# Patient Record
Sex: Female | Born: 1955 | Race: White | Hispanic: No | Marital: Married | Smoking: Former smoker
Health system: Southern US, Community
[De-identification: ages and names within clinical notes are randomized; demographics above are authoritative.]

## PROBLEM LIST (undated history)

## (undated) DIAGNOSIS — I639 Cerebral infarction, unspecified: Secondary | ICD-10-CM

## (undated) DIAGNOSIS — I10 Essential (primary) hypertension: Secondary | ICD-10-CM

## (undated) DIAGNOSIS — N189 Chronic kidney disease, unspecified: Secondary | ICD-10-CM

## (undated) DIAGNOSIS — H269 Unspecified cataract: Secondary | ICD-10-CM

## (undated) DIAGNOSIS — E119 Type 2 diabetes mellitus without complications: Secondary | ICD-10-CM

## (undated) DIAGNOSIS — E785 Hyperlipidemia, unspecified: Secondary | ICD-10-CM

## (undated) HISTORY — PX: COLONOSCOPY: SHX174

## (undated) HISTORY — DX: Cerebral infarction, unspecified: I63.9

## (undated) HISTORY — PX: BACK SURGERY: SHX140

## (undated) HISTORY — DX: Unspecified cataract: H26.9

## (undated) HISTORY — DX: Type 2 diabetes mellitus without complications: E11.9

## (undated) HISTORY — DX: Hyperlipidemia, unspecified: E78.5

## (undated) HISTORY — PX: ABDOMINAL HYSTERECTOMY: SHX81

## (undated) HISTORY — DX: Chronic kidney disease, unspecified: N18.9

---

## 1998-06-06 ENCOUNTER — Ambulatory Visit (HOSPITAL_COMMUNITY): Admission: RE | Admit: 1998-06-06 | Discharge: 1998-06-06 | Payer: Self-pay | Admitting: Family Medicine

## 1999-11-07 ENCOUNTER — Other Ambulatory Visit: Admission: RE | Admit: 1999-11-07 | Discharge: 1999-11-07 | Payer: Self-pay | Admitting: Family Medicine

## 2001-02-12 ENCOUNTER — Encounter: Payer: Self-pay | Admitting: Family Medicine

## 2001-02-12 ENCOUNTER — Encounter: Admission: RE | Admit: 2001-02-12 | Discharge: 2001-02-12 | Payer: Self-pay | Admitting: Family Medicine

## 2001-05-07 ENCOUNTER — Encounter: Payer: Self-pay | Admitting: Family Medicine

## 2001-05-07 ENCOUNTER — Encounter: Admission: RE | Admit: 2001-05-07 | Discharge: 2001-05-07 | Payer: Self-pay | Admitting: Family Medicine

## 2002-12-20 ENCOUNTER — Encounter: Payer: Self-pay | Admitting: Family Medicine

## 2002-12-20 ENCOUNTER — Encounter: Admission: RE | Admit: 2002-12-20 | Discharge: 2002-12-20 | Payer: Self-pay | Admitting: Family Medicine

## 2002-12-22 ENCOUNTER — Encounter: Admission: RE | Admit: 2002-12-22 | Discharge: 2002-12-22 | Payer: Self-pay | Admitting: Family Medicine

## 2002-12-22 ENCOUNTER — Encounter: Payer: Self-pay | Admitting: Family Medicine

## 2003-06-11 ENCOUNTER — Emergency Department (HOSPITAL_COMMUNITY): Admission: EM | Admit: 2003-06-11 | Discharge: 2003-06-11 | Payer: Self-pay | Admitting: Emergency Medicine

## 2004-01-03 ENCOUNTER — Ambulatory Visit (HOSPITAL_COMMUNITY): Admission: RE | Admit: 2004-01-03 | Discharge: 2004-01-04 | Payer: Self-pay | Admitting: Neurosurgery

## 2004-08-21 ENCOUNTER — Encounter: Admission: RE | Admit: 2004-08-21 | Discharge: 2004-08-21 | Payer: Self-pay | Admitting: Family Medicine

## 2006-11-25 ENCOUNTER — Other Ambulatory Visit: Admission: RE | Admit: 2006-11-25 | Discharge: 2006-11-25 | Payer: Self-pay | Admitting: Family Medicine

## 2006-11-27 ENCOUNTER — Encounter: Admission: RE | Admit: 2006-11-27 | Discharge: 2006-11-27 | Payer: Self-pay | Admitting: Family Medicine

## 2006-12-24 ENCOUNTER — Encounter: Admission: RE | Admit: 2006-12-24 | Discharge: 2006-12-24 | Payer: Self-pay | Admitting: Family Medicine

## 2006-12-29 ENCOUNTER — Encounter: Admission: RE | Admit: 2006-12-29 | Discharge: 2006-12-29 | Payer: Self-pay | Admitting: Family Medicine

## 2007-03-09 ENCOUNTER — Inpatient Hospital Stay (HOSPITAL_COMMUNITY): Admission: RE | Admit: 2007-03-09 | Discharge: 2007-03-11 | Payer: Self-pay | Admitting: Obstetrics and Gynecology

## 2007-03-09 ENCOUNTER — Encounter (INDEPENDENT_AMBULATORY_CARE_PROVIDER_SITE_OTHER): Payer: Self-pay | Admitting: Obstetrics and Gynecology

## 2007-03-13 ENCOUNTER — Inpatient Hospital Stay (HOSPITAL_COMMUNITY): Admission: AD | Admit: 2007-03-13 | Discharge: 2007-03-13 | Payer: Self-pay | Admitting: Obstetrics and Gynecology

## 2007-06-29 ENCOUNTER — Encounter: Admission: RE | Admit: 2007-06-29 | Discharge: 2007-06-29 | Payer: Self-pay | Admitting: Family Medicine

## 2007-12-28 ENCOUNTER — Encounter: Admission: RE | Admit: 2007-12-28 | Discharge: 2007-12-28 | Payer: Self-pay | Admitting: Family Medicine

## 2009-03-08 ENCOUNTER — Encounter: Admission: RE | Admit: 2009-03-08 | Discharge: 2009-03-08 | Payer: Self-pay | Admitting: Family Medicine

## 2009-03-20 ENCOUNTER — Encounter: Admission: RE | Admit: 2009-03-20 | Discharge: 2009-03-20 | Payer: Self-pay | Admitting: Family Medicine

## 2010-03-21 ENCOUNTER — Encounter: Admission: RE | Admit: 2010-03-21 | Discharge: 2010-03-21 | Payer: Self-pay | Admitting: Family Medicine

## 2010-05-12 ENCOUNTER — Encounter: Payer: Self-pay | Admitting: Family Medicine

## 2010-09-03 NOTE — H&P (Signed)
NAMEARAIYA, Belinda Banks               ACCOUNT NO.:  [id number]   MEDICAL RECORD NO.:  [id number]          PATIENT TYPE:  AMB   LOCATION:  SDC                           [facility]  [facility]   PHYSICIAN:  Charles A. Delcambre, MDDATE OF BIRTH:  1955-07-19   DATE OF ADMISSION:  DATE OF DISCHARGE:                              HISTORY & PHYSICAL   This patient comes in today for further discussion with an ultrasound  showing an 8.4-cm ovarian cyst and sharp pains in the right lower  quadrant, heavy bleeding.  Hemoglobin has been in the 8 range, increased  to 9 range.  Fibroids were 3 to 4 cm and multiple.  She comes in for  discussion of ablation, laparoscopy versus TAH-BSO, and we have decided  to proceed on with definitive therapy, TAH-BSO.  She is a 55 year old  gravida 2, para 2.   PAST MEDICAL HISTORY:  Hypertension.   SURGICAL HISTORY:  Disk, anterior neck approach.   MEDICATIONS:  1. Toprol XL, dose not specified daily.  2. Diovan, dose not specified daily.  3. Iron and multivitamin daily.   PCP:  Dr. Manus Gunning at the [facility].   SOCIAL HISTORY:  Social alcohol use.  Quit smoking this year.  Sexually  active in a monogamous relationship with her partner monogamously.   FAMILY HISTORY:  Hypertension.  Breast cancer and on her mother's side  diabetes and osteoporosis, otherwise negative.   REVIEW OF SYSTEMS:  Anemia, otherwise negative full review.   PHYSICAL EXAM:  Blood pressure 140.90.  Heart rate 80.  Respirations 20.  Afebrile.  Weight 165.  Height 5 feet 8 inches.  HEENT:  Exam grossly within normal limits.  NECK:  Supple without thyromegaly or adenopathy.  LUNGS:  Clear bilaterally.  BREASTS:  Deferred.  Symmetrical.  ABDOMEN:  Soft, flat, and nontender.  No hepatosplenomegaly or other  masses noted.  PELVIC EXAM:  Normal external female genitalia.  BUS within normal  limits.  Vault without discharge or lesions.  Endometrial biopsy has  been done on 02/16/07.  Benign  proliferative endometrium and  degenerative secretory endometrium.  Stromal breakdown.  Bimanual  examination, uterus enlarged with leiomyomata.  Fullness in the right  adnexa.  Gently, knowing that the ovaries showed a cyst there, uterus 14-  week size.  EXTREMITIES:  No edema bilaterally.  Nontender.   ASSESSMENT:  1. Menorrhagia, 66.2.  2. Simple ovarian large but very large at 8.5 cm, 620.2.  3. Fibroid uterus, 218.9.  4. Irregular bleeding, 66.4.   PLAN:  Discussed as noted above and she decides on definitive treatment  with TAH-BSO.  She gives informed consent.  Accepts risks of infection,  bleeding, bowel and bladder damage, blood product risk including  hepatitis and HIV exposure, ureteral damage, incisional infection, deep  vein thrombosis.  All questions were answered.  She gives informed  consent and wishes to proceed.  Admit.  Take her blood pressure in the  morning.  Medications in the morning with a sip of water.  She will be  admitted on the 18th of November to undergo surgery.  SCDs will  be used  on to the OR.  Preoperative Cefotetan 2 g will be given IV.  Type and  cross will be done.  CMET will be drawn preoperatively and urine  pregnancy test date of admission.  All questions were answered and we  will proceed as outlined.      Charles A. Sydnee Cabal, MD  Electronically Signed     CAD/MEDQ  D:  03/01/2007  T:  03/02/2007  Job:  [id number]

## 2010-09-03 NOTE — Op Note (Signed)
Belinda Banks, Belinda Banks               ACCOUNT NO.:  [id number]   MEDICAL RECORD NO.:  [id number]          PATIENT TYPE:  INP   LOCATION:  9399                          [facility]ILITY[facility]  [facility]   PHYSICIAN:  Charles A. Delcambre, MDDATE OF BIRTH:  [birth year]   DATE OF PROCEDURE:  03/09/2007  DATE OF DISCHARGE:                               OPERATIVE REPORT   PREOPERATIVE DIAGNOSES:  1. Menorrhagia.  2. Uterine fibroids.  3. Irregular bleeding.  4. An 8 cm simple cyst left ovary.   POSTOPERATIVE DIAGNOSES:  1. Menorrhagia.  2. Uterine fibroids.  3. Irregular bleeding.  4. Left ovary with a paratubal cyst approximately 3 cm, together with      the ovary would be about 6 cm.  The ovary otherwise appeared      normal.   PROCEDURE:  Transabdominal hysterectomy, bilateral salpingo-  oophorectomy.   SURGEON:  Charles A. Sydnee Cabal, MD.   ASSISTANT:  Gerald Leitz, MD.   COMPLICATIONS:  None.   BLOOD LOSS:  500 mL.   ANESTHESIA:  General by the endotracheal route.   OPERATIVE FINDINGS:  Uterus with several 3-4 cm fibroids and otherwise  peritubal cyst as noted above.  Normal-appearing ovaries otherwise.  Uterus, tubes and ovaries to pathology.  Sponge, needle and instrument  counts were correct x2.   DESCRIPTION OF PROCEDURE:  The patient was taken to the operating room,  placed in the supine position.  A sterile prep and drape was undertaken  after anesthesia was adequate.  Pfannenstiel incision was made with  knife, carried down to fascia.  Fascia was incised with knife and Mayo  scissors.  Rectus sheath was released superiorly and inferiorly sharply  with Metzenbaum scissors and some electrocautery without damage to the  surrounding structures.  Peritoneum was entered with Metzenbaum scissors  without damage to the bowel.  Peritoneum was taken superiorly and  inferiorly.  Balfour retractor was placed.  Bladder blade was placed.  Bowel was packed out of the pelvis with four moistened laps.   The uterus  was grasped at the cornual regions with Kelly clamps, round ligaments  were opened bilaterally.  Lower uterine segment vesicouterine peritoneum  was taken with Metzenbaums to drop the bladder off the uterus.  We  transected the round ligaments and opened the broad ligament, then we  looked for the ureters.  Ureter was isolated on either side and the  infundibulopelvic pedicles were isolated away from the ureter.  They  were transected and free-tied and then transfixion stitched with 0  Vicryl.  Good  hemostasis resulted on either side.  Bladder was taken  down sharply with Metzenbaum scissors and with blunt dissection with  sponge forceps.  The uterine vessels were skeletonized and taken with  simple stitches bilaterally.  Hemostasis was good.  There was  encountered some backbleeding lower with each pedicle and for this  reason, blood loss was estimated to be 500 mL as we got into some venous  bleeding around the pedicle on the left.  This was crossclamped and  transfixion stitched with hemostasis resulting.  Remainder of the  cardinal  ligaments were taken down either side.  Pedicles were taken  down.  Remainder of the cardinal ligaments and finally the uterus was  amputated from the cervix, allowing better exposure.  The final clamps  cut the uterosacral ligaments in vaginal angle, cervix was amputated  from the vagina.  Richardson angle sutures were placed with 0 Vicryl and  these were tied to the uterosacral ligaments on either side.  Running  locking 0 Vicryl was used to close the cuff.  Hemostasis was adequate  after minor electrocautery.  On an the bladder peritoneal edge,  irrigation was carried out.  Hemostasis was again verified.  Subfascial  hemostasis was excellent after minor electrocautery.  The fascia was  then closed after all materials had been removed, Balfour retractor and  four laps.  Lap count was correct at that point.  The fascia was closed  with #1  Vicryl running nonlocking suture.  Hemostasis was achieved in  subcutaneous tissue with minor electrocautery, 5-0 Vicryl sutures were  used for subcutaneous closure.  Irrigation was carried out.  Hemostasis  was good.  Sterile skin clips were placed on the skin to close the  incision.  Sterile dressing was applied.  The patient tolerated the  procedure well and was taken to recovery with physician in attendance.      Charles A. Sydnee Cabal, MD  Electronically Signed     CAD/MEDQ  D:  03/09/2007  T:  03/09/2007  Job:  [id number]   cc:   Bryan Lemma. Manus Gunning, M.D.  Fax: [phone]

## 2010-09-06 NOTE — Discharge Summary (Signed)
Belinda Banks, Belinda Banks               ACCOUNT NO.:  [id number]   MEDICAL RECORD NO.:  [id number]          PATIENT TYPE:  INP   LOCATION:  9318                          [facility]  [facility]   PHYSICIAN:  Charles A. Delcambre, MDDATE OF BIRTH:  05-02-1955   DATE OF ADMISSION:  03/09/2007  DATE OF DISCHARGE:  03/11/2007                               DISCHARGE SUMMARY   DISCHARGE DIAGNOSES:  1. Cervical microglandular hyperplasia, benign.  2. Uterine leiomyomata.   PATHOLOGY:  Uterus, cervix and tubes and ovaries to pathology and also  note there were benign peritubal cysts noted.   DISPOSITION:  The patient is discharged home.   FOLLOW UP:  Follow to [facility] on March 13, 2007, two days later, for  discontinuation of staples.   DISCHARGE MEDICATIONS:  1. Percocet 5/325 one to two p.o. q.4 h. p.r.n. #40.  2. Motrin 800 mg one p.o. q.8 h. p.r.n., #30 x1.  3. KCl 20 mEq per day, #30 x1 and follow up with her PCP.   SPECIAL INSTRUCTIONS:  Potassium was checked prior to discharge and was  3.1.  It had stayed 3.1-3.2 throughout the hospitalization.  She was  given convalescent instructions and to notify if temperature greater  than 100 degrees, abdominal incisional, wound erythema or drainage or  pus or any significant vaginal bleeding.   ACTIVITY:  No driving x2 weeks.  No bath x2 weeks and up to that time a  shower will be okay.  No lifting greater than 30 pounds x4 weeks.   Cervix, uterus, tubes and ovaries to pathology.  Pathologist with  findings as noted above.   PERTINENT LABORATORY DATA:  Hematocrit 28.2, hemoglobin 9.8 on date of  discharge.  Potassium as noted above.  Pathology as noted above.   HOSPITAL COURSE:  The patient was admitted and underwent surgery as  noted above.  She had routine postoperative course without complication.  She had PCA released postop day #1 and voided without difficulty.  She  had some decreased urine output during the day and responded to pain  medications in a fluid bolus.  She continued  to do well and was given a soft diet on postop day #1.  KCl p.o. was 20  mEq per day.  Pain was controlled.  Diet was tolerated.  She was  ambulating, voiding without difficulty and was therefore discharged home  with followup as noted above.      Charles A. Sydnee Cabal, MD  Electronically Signed     CAD/MEDQ  D:  04/18/2007  T:  04/19/2007  Job:  [id number]

## 2010-09-06 NOTE — Op Note (Signed)
Belinda Banks, Belinda Banks                         ACCOUNT NO.:  [id number]   MEDICAL RECORD NO.:  [id number]                   PATIENT TYPE:  OIB   LOCATION:  3014                                 FACILITY:  [facility]   PHYSICIAN:  Cristi Loron, M.D.            DATE OF BIRTH:  1955-05-17   DATE OF PROCEDURE:  01/02/2004  DATE OF DISCHARGE:                                 OPERATIVE REPORT   BRIEF HISTORY:  The patient is a 55 year old white female who suffers from  neck and right arm pain.  She has failed medical management and was worked  up with a cervical MI which demonstrated a herniated disk and spondylosis at  C5-6 on the right. The patient's physical exam was consistent with a right  C6 radiculopathy.  I discussed the various treatment options with her  including surgery.  The patient was explained the risks, benefits, and  alternatives of surgery.  We decided to proceed with C5-6 anterior cervical  discectomy with fusion plating.   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   PROCEDURE:  C5-6 extensive anterior cervical discectomy/decompression,  interbody iliac crest, allograft arthrodesis; anterior cervical plating  (Codman's slim lock titanium plate and screws).   SURGEON:  Cristi Loron, M.D.   ASSISTANT:  None.   ANESTHESIA:  General endotracheal.   ESTIMATED BLOOD LOSS:  50 cc.   SPECIMENS:  None.   DRAINS:  None.   COMPLICATIONS:  None.   DESCRIPTION OF PROCEDURE:  The patient was brought to the operating room by  the anesthesia team.  General endotracheal anesthesia was induced.  The  patient remained in the supine position.  A roll was placed under her  shoulders which placed her neck in slight extension.  The anterior cervical  region was then prepared  with Betadine scrub and Betadine solution.  Sterile  drapes were applied.  I then injected the disk spaces on each side with  Marcaine with epinephrine solution.   I used a scalpel to make a transverse incision at the patient's left  anterior neck. I used the Metzenbaum scissors to divide the platysmal muscle  and then to dissect medially to the sternocleidomastoid muscle, jugular vein  and carotid artery.  I bluntly dissected down towards the anterior cervical  spine and carefully identified the esophagus and retracted it medially.  I  cleared the soft tissue from the anterior cervical spine using Kitner swabs.  I then inserted a spine needle into the upper intervertebral disk space and  obtained intraoperative radiographs to confirm our location.   We then used electrocautery to detach the medial border of the longus colli  muscle bilaterally at C5-6 intervertebral disk space.  We inserted a Caspar  self-retaining retractor exposure and then using a 15 blade scalpel to  incise  the C5-6 intervertebral disk space.   We then performed a partial diskectomy using pituitary forceps and a Karlen  curette.  We inserted distraction screws in the C5 and C6 distracted  interspace and then using high speed drill, decorticated the vertebral end  plates of Z6-1.  We drilled away the remainder of the C5-6 intervertebral  disk, to drill out some posterior spondylosis, and to thin out the posterior  longitudinal ligament.  We then incised the ligament with the retro knife,  removed it with the Kerrison punch undercutting the vertebral end plates and  decompressing of the thecal sac.  We performed at foraminotomy about the  bilateral C6 nerve root completing the decompression.  Of note, there was a  herniated disk at C5-6 on the right compressing the right C6 nerve root.  This completed the decompression.   We now turned attention to the arthrodesis.  We obtained iliac crest  tricortical allograft  bone graft and fashioned it to these approximate  dimensions 7 mm of height and 1 cm of depth.  We inserted the bone graft  into the distracted intervertebral disk space and then removed the  distraction screws.  There was a good snug fit of the bone graft.   We now turned out attention to the anterior spinal instrumentation.  We used  a high-speed drill to drill away some ventral spondylosis from the C5-6  intervertebral disk space.  There was quite a bit of spondylosis on the  right side.  We did this so that the plate would lay down flat.  We then  selected the appropriate length Codman SlimLock anterior cervical plate and  laid it along the anterior aspect of vertebral bodies at C5 and C6.  We  drilled 2 holes at C5, two at C6 and then secured the plate to the vertebral  bodies by placing two 12 mm self-tapping screws at C5, two at C6 and then  obtained an intraoperative radiograph which demonstrated good position of  the plate and screws in the bone graft and then we secured these screws to  the plate by locking each cap.   We then obtained stringent hemostasis using bipolar electrocautery.  We  copiously irrigated the wound with bacitracin solution and then removed the  Caspar self-retaining retractor.  We inspected the esophagus for any damage.  There was none apparent.  We then reapproximated with the patient's platysma  muscle with interrupted 3-0 Vicryl suture, subcutaneous tissue with #0  Vicryl suture and the skin with Steri-Strips and Benzoin.  The wound was  then coated with bacitracin ointment.  A sterile dressing was applied.  The  drapes were removed.  The patient was subsequently extubated by the  anesthesia team and transported to the postanesthesia care unit in stable  condition.  All sponge, instrument, and needle counts were correct in this  case.                                               Cristi Loron, M.D.   JDJ/MEDQ  D:  01/03/2004  T:  01/03/2004   Job:  [id number]

## 2011-01-28 LAB — COMPREHENSIVE METABOLIC PANEL
ALT: 39 — ABNORMAL HIGH
Albumin: 2.6 — ABNORMAL LOW
Albumin: 4
Alkaline Phosphatase: 33 — ABNORMAL LOW
BUN: 12
CO2: 33 — ABNORMAL HIGH
Chloride: 99
Creatinine, Ser: 0.59
Creatinine, Ser: 0.65
GFR calc Af Amer: >60
GFR calc Af Amer: >60
Glucose, Bld: 95
Potassium: 3.2 — ABNORMAL LOW
Total Protein: 4.6 — ABNORMAL LOW

## 2011-01-28 LAB — CBC
HCT: 28.2 — ABNORMAL LOW
HCT: 40.8
Hemoglobin: 9.8 — ABNORMAL LOW
Hemoglobin: 13.9
MCHC: 34.8
MCHC: 34
MCV: 86.8
MCV: 84.3
Platelets: 165
Platelets: 212
RBC: 3.25 — ABNORMAL LOW
RBC: 4.85
RDW: 30.2 — ABNORMAL HIGH
RDW: 31.5 — ABNORMAL HIGH
WBC: 7.6
WBC: 5.8

## 2011-01-28 LAB — ABO/RH: ABO/RH(D): A NEG

## 2011-01-28 LAB — PREGNANCY, URINE: Preg Test, Ur: NEGATIVE

## 2011-07-15 ENCOUNTER — Other Ambulatory Visit: Payer: Self-pay | Admitting: Family Medicine

## 2011-07-15 DIAGNOSIS — Z1231 Encounter for screening mammogram for malignant neoplasm of breast: Secondary | ICD-10-CM

## 2011-07-16 ENCOUNTER — Ambulatory Visit
Admission: RE | Admit: 2011-07-16 | Discharge: 2011-07-16 | Disposition: A | Discharge status: Home / Self Care | Payer: Managed Care, Other (non HMO) | Source: Ambulatory Visit | Attending: Family Medicine | Admitting: Family Medicine

## 2011-07-16 DIAGNOSIS — Z1231 Encounter for screening mammogram for malignant neoplasm of breast: Secondary | ICD-10-CM

## 2012-09-09 ENCOUNTER — Other Ambulatory Visit: Payer: Self-pay

## 2012-09-09 DIAGNOSIS — Z1231 Encounter for screening mammogram for malignant neoplasm of breast: Secondary | ICD-10-CM

## 2012-09-24 ENCOUNTER — Ambulatory Visit
Admission: RE | Admit: 2012-09-24 | Discharge: 2012-09-24 | Disposition: A | Discharge status: Home / Self Care | Payer: Managed Care, Other (non HMO) | Source: Ambulatory Visit

## 2012-09-24 DIAGNOSIS — Z1231 Encounter for screening mammogram for malignant neoplasm of breast: Secondary | ICD-10-CM

## 2019-03-05 ENCOUNTER — Inpatient Hospital Stay (HOSPITAL_COMMUNITY)
Admission: EM | Admit: 2019-03-05 | Discharge: 2019-03-09 | DRG: 064 | Disposition: A | Discharge status: Home / Self Care | Payer: Managed Care, Other (non HMO) | Attending: Neurology | Admitting: Neurology

## 2019-03-05 ENCOUNTER — Encounter (HOSPITAL_COMMUNITY): Payer: Self-pay

## 2019-03-05 ENCOUNTER — Other Ambulatory Visit: Payer: Self-pay

## 2019-03-05 ENCOUNTER — Emergency Department (HOSPITAL_COMMUNITY): Payer: Managed Care, Other (non HMO)

## 2019-03-05 DIAGNOSIS — R29704 NIHSS score 4: Secondary | ICD-10-CM | POA: Diagnosis present

## 2019-03-05 DIAGNOSIS — I161 Hypertensive emergency: Secondary | ICD-10-CM | POA: Diagnosis present

## 2019-03-05 DIAGNOSIS — I610 Nontraumatic intracerebral hemorrhage in hemisphere, subcortical: Secondary | ICD-10-CM

## 2019-03-05 DIAGNOSIS — R29810 Facial weakness: Secondary | ICD-10-CM | POA: Diagnosis not present

## 2019-03-05 DIAGNOSIS — R4781 Slurred speech: Secondary | ICD-10-CM | POA: Diagnosis present

## 2019-03-05 DIAGNOSIS — I639 Cerebral infarction, unspecified: Secondary | ICD-10-CM | POA: Diagnosis present

## 2019-03-05 DIAGNOSIS — E876 Hypokalemia: Secondary | ICD-10-CM | POA: Diagnosis present

## 2019-03-05 DIAGNOSIS — Z20828 Contact with and (suspected) exposure to other viral communicable diseases: Secondary | ICD-10-CM | POA: Diagnosis present

## 2019-03-05 DIAGNOSIS — Z87891 Personal history of nicotine dependence: Secondary | ICD-10-CM

## 2019-03-05 DIAGNOSIS — Z90710 Acquired absence of both cervix and uterus: Secondary | ICD-10-CM | POA: Diagnosis not present

## 2019-03-05 DIAGNOSIS — E785 Hyperlipidemia, unspecified: Secondary | ICD-10-CM | POA: Diagnosis present

## 2019-03-05 DIAGNOSIS — E1159 Type 2 diabetes mellitus with other circulatory complications: Secondary | ICD-10-CM | POA: Diagnosis not present

## 2019-03-05 DIAGNOSIS — Z7289 Other problems related to lifestyle: Secondary | ICD-10-CM | POA: Diagnosis not present

## 2019-03-05 DIAGNOSIS — I10 Essential (primary) hypertension: Secondary | ICD-10-CM | POA: Diagnosis present

## 2019-03-05 DIAGNOSIS — F1010 Alcohol abuse, uncomplicated: Secondary | ICD-10-CM | POA: Diagnosis present

## 2019-03-05 DIAGNOSIS — I619 Nontraumatic intracerebral hemorrhage, unspecified: Secondary | ICD-10-CM | POA: Diagnosis not present

## 2019-03-05 DIAGNOSIS — E119 Type 2 diabetes mellitus without complications: Secondary | ICD-10-CM

## 2019-03-05 DIAGNOSIS — E1165 Type 2 diabetes mellitus with hyperglycemia: Secondary | ICD-10-CM | POA: Diagnosis present

## 2019-03-05 DIAGNOSIS — I6389 Other cerebral infarction: Secondary | ICD-10-CM | POA: Diagnosis not present

## 2019-03-05 DIAGNOSIS — Z789 Other specified health status: Secondary | ICD-10-CM

## 2019-03-05 HISTORY — DX: Essential (primary) hypertension: I10

## 2019-03-05 LAB — CBC
Hemoglobin: 18.7 g/dL — ABNORMAL HIGH (ref 12.0–15.0)
Platelets: 176 10*3/uL (ref 150–400)
WBC: 11.5 10*3/uL — ABNORMAL HIGH (ref 4.0–10.5)

## 2019-03-05 LAB — COMPREHENSIVE METABOLIC PANEL
ALT: 100 U/L — ABNORMAL HIGH (ref 0–44)
AST: 86 U/L — ABNORMAL HIGH (ref 15–41)
Albumin: 4 g/dL (ref 3.5–5.0)
Alkaline Phosphatase: 107 U/L (ref 38–126)
Anion gap: 18 — ABNORMAL HIGH (ref 5–15)
BUN: 7 mg/dL — ABNORMAL LOW (ref 8–23)
CO2: 24 mmol/L (ref 22–32)
Calcium: 9.1 mg/dL (ref 8.9–10.3)
Chloride: 95 mmol/L — ABNORMAL LOW (ref 98–111)
Creatinine, Ser: 0.71 mg/dL (ref 0.44–1.00)
GFR calc Af Amer: >60 mL/min (ref >60)
GFR calc non Af Amer: >60 mL/min (ref >60)
Glucose, Bld: 224 mg/dL — ABNORMAL HIGH (ref 70–99)
Potassium: 2.4 mmol/L — CL (ref 3.5–5.1)
Sodium: 137 mmol/L (ref 135–145)
Total Bilirubin: 2.1 mg/dL — ABNORMAL HIGH (ref 0.3–1.2)
Total Protein: 8 g/dL (ref 6.5–8.1)

## 2019-03-05 LAB — I-STAT CHEM 8, ED
BUN: 7 mg/dL — ABNORMAL LOW (ref 8–23)
Calcium, Ion: 0.92 mmol/L — ABNORMAL LOW (ref 1.15–1.40)
Chloride: 96 mmol/L — ABNORMAL LOW (ref 98–111)
Creatinine, Ser: 0.6 mg/dL (ref 0.44–1.00)
Glucose, Bld: 226 mg/dL — ABNORMAL HIGH (ref 70–99)
HCT: 52 % — ABNORMAL HIGH (ref 36.0–46.0)
Hemoglobin: 17.7 g/dL — ABNORMAL HIGH (ref 12.0–15.0)
Potassium: 2.5 mmol/L — CL (ref 3.5–5.1)
Sodium: 137 mmol/L (ref 135–145)
TCO2: 25 mmol/L (ref 22–32)

## 2019-03-05 LAB — DIFFERENTIAL
Abs Immature Granulocytes: 0.04 10*3/uL (ref 0.00–0.07)
Basophils Absolute: 0.1 10*3/uL (ref 0.0–0.1)
Basophils Relative: 0 %
Eosinophils Absolute: 0.1 10*3/uL (ref 0.0–0.5)
Eosinophils Relative: 1 %
Immature Granulocytes: 0 %
Lymphocytes Relative: 16 %
Lymphs Abs: 1.8 10*3/uL (ref 0.7–4.0)
Monocytes Absolute: 1 10*3/uL (ref 0.1–1.0)
Monocytes Relative: 8 %
Neutro Abs: 8.6 10*3/uL — ABNORMAL HIGH (ref 1.7–7.7)
Neutrophils Relative %: 75 %

## 2019-03-05 LAB — PROTIME-INR
INR: 1.2 (ref 0.8–1.2)
Prothrombin Time: 15.1 seconds (ref 11.4–15.2)

## 2019-03-05 LAB — APTT: aPTT: 29 seconds (ref 24–36)

## 2019-03-05 LAB — MRSA PCR SCREENING: MRSA by PCR: NEGATIVE

## 2019-03-05 LAB — CBG MONITORING, ED: Glucose-Capillary: 229 mg/dL — ABNORMAL HIGH (ref 70–99)

## 2019-03-05 MED ORDER — STROKE: EARLY STAGES OF RECOVERY BOOK
Freq: Once | Status: AC
  Administered 2019-03-05: 21:00:00
  Filled 2019-03-05: qty 1

## 2019-03-05 MED ORDER — LABETALOL HCL 5 MG/ML IV SOLN
20.0000 mg | Freq: Once | INTRAVENOUS | Status: AC
  Administered 2019-03-05: 20 mg via INTRAVENOUS

## 2019-03-05 MED ORDER — ACETAMINOPHEN 650 MG RE SUPP: 650.0000 mg | RECTAL | Status: DC | PRN

## 2019-03-05 MED ORDER — LABETALOL HCL 5 MG/ML IV SOLN
20.0000 mg | Freq: Once | INTRAVENOUS | Status: AC
  Administered 2019-03-05: 19:00:00 20 mg via INTRAVENOUS

## 2019-03-05 MED ORDER — SENNOSIDES-DOCUSATE SODIUM 8.6-50 MG PO TABS
1.0000 | ORAL_TABLET | Freq: Two times a day (BID) | ORAL | Status: DC
  Administered 2019-03-06 – 2019-03-07 (×3): 1 via ORAL
  Filled 2019-03-05 (×6): qty 1

## 2019-03-05 MED ORDER — CLEVIDIPINE BUTYRATE 0.5 MG/ML IV EMUL
INTRAVENOUS | Status: AC
  Filled 2019-03-05: qty 50

## 2019-03-05 MED ORDER — SODIUM CHLORIDE 0.9% FLUSH: 3.0000 mL | Freq: Once | INTRAVENOUS | Status: DC

## 2019-03-05 MED ORDER — LABETALOL HCL 5 MG/ML IV SOLN
INTRAVENOUS | Status: AC
  Administered 2019-03-05: 19:00:00 20 mg via INTRAVENOUS
  Filled 2019-03-05: qty 4

## 2019-03-05 MED ORDER — VITAMIN B-1 100 MG PO TABS
100.0000 mg | ORAL_TABLET | Freq: Every day | ORAL | Status: DC
  Administered 2019-03-06 – 2019-03-09 (×4): 100 mg via ORAL
  Filled 2019-03-05 (×4): qty 1

## 2019-03-05 MED ORDER — CLEVIDIPINE BUTYRATE 0.5 MG/ML IV EMUL
0.0000 mg/h | INTRAVENOUS | Status: DC
  Administered 2019-03-05 (×2): 21 mg/h via INTRAVENOUS
  Administered 2019-03-05: 22:00:00 18 mg/h via INTRAVENOUS
  Administered 2019-03-05: 14 mg/h via INTRAVENOUS
  Administered 2019-03-05: 22:00:00 16 mg/h via INTRAVENOUS
  Administered 2019-03-05: 1 mg/h via INTRAVENOUS
  Administered 2019-03-06: 4 mg/h via INTRAVENOUS
  Administered 2019-03-06: 2 mg/h via INTRAVENOUS
  Administered 2019-03-07 (×2): 8 mg/h via INTRAVENOUS
  Administered 2019-03-07: 4 mg/h via INTRAVENOUS
  Administered 2019-03-07: 8 mg/h via INTRAVENOUS
  Administered 2019-03-07: 4 mg/h via INTRAVENOUS
  Administered 2019-03-07: 5 mg/h via INTRAVENOUS
  Filled 2019-03-05 (×11): qty 50
  Filled 2019-03-05: qty 100
  Filled 2019-03-05: qty 50

## 2019-03-05 MED ORDER — ACETAMINOPHEN 325 MG PO TABS
650.0000 mg | ORAL_TABLET | ORAL | Status: DC | PRN
  Filled 2019-03-05: qty 2

## 2019-03-05 MED ORDER — ACETAMINOPHEN 160 MG/5ML PO SOLN: 650.0000 mg | ORAL | Status: DC | PRN

## 2019-03-05 MED ORDER — POTASSIUM CHLORIDE 10 MEQ/100ML IV SOLN
10.0000 meq | INTRAVENOUS | Status: AC
  Administered 2019-03-05 (×3): 10 meq via INTRAVENOUS
  Filled 2019-03-05 (×2): qty 100

## 2019-03-05 MED ORDER — ORAL CARE MOUTH RINSE
15.0000 mL | Freq: Two times a day (BID) | OROMUCOSAL | Status: DC
  Administered 2019-03-05 – 2019-03-08 (×5): 15 mL via OROMUCOSAL

## 2019-03-05 MED ORDER — PANTOPRAZOLE SODIUM 40 MG IV SOLR
40.0000 mg | Freq: Every day | INTRAVENOUS | Status: DC
  Administered 2019-03-05: 22:00:00 40 mg via INTRAVENOUS
  Filled 2019-03-05: qty 40

## 2019-03-05 NOTE — H&P (Addendum)
NEURO HOSPITALIST  H&P   Chief Complaint: slurred speech/ right side weakness  History obtained from:  Patient     HPI:                                                                                                                                         Belinda Banks is an 63 y.o. female  With PMH HTN who presented to [facility] ED as a code stroke with c/o slurred speech and right side weakness.   per patient she had just returned home from playing with her grandchild at the park. She had a sudden onset of slurred speech. She then went to the bathroom and saw that her face was also drooping. EMS was called. Denies any CP, SOB, vision changes. She did report that her  Right arm was clumsy. Symptoms did get somewhat  Better before arriving to the hospital. Patient has not seen a doctor in about 7 years. She was previously diagnosed with HTN  Had lost weight and thus was removed from hypertensive medications. She has since regained weight.  ED course:  CTH: shows ICH BP: 230/158 BG: 238    Date last known well: 03/05/2019 Time last known well: 1730 tPA Given:no; ICH       Modified Rankin: Rankin Score=0 NIHSS: 4; slurred speech right arm and leg drift, sensation Intracerebral Hemorrhage (ICH) Score Total:  0    No family history on file.     Social History:  Denies ETOH, cigarettes, drug abuse  Allergies: No Known Allergies  Medications:                                                                                                                           Current Facility-Administered Medications  Medication Dose Route Frequency Provider Last Rate Last Dose  .  stroke: mapping our early stages of recovery book   Does not apply Once Vonzella Nipple, NP      . acetaminophen (TYLENOL) tablet 650 mg  650 mg Oral Q4H PRN Vonzella Nipple, NP  Or  . acetaminophen (TYLENOL) 160 MG/5ML solution 650 mg  650 mg Per Tube Q4H PRN  Vonzella Nipple, NP       Or  . acetaminophen (TYLENOL) suppository 650 mg  650 mg Rectal Q4H PRN Vonzella Nipple, NP      . clevidipine (CLEVIPREX) 0.5 MG/ML infusion           . clevidipine (CLEVIPREX) infusion 0.5 mg/mL  0-21 mg/hr Intravenous Continuous [facility], Karena Addison R, MD      . pantoprazole (PROTONIX) injection 40 mg  40 mg Intravenous QHS Vonzella Nipple, NP      . senna-docusate (Senokot-S) tablet 1 tablet  1 tablet Oral BID Vonzella Nipple, NP      . sodium chloride flush (NS) 0.9 % injection 3 mL  3 mL Intravenous Once Maudie Flakes, MD       No current outpatient medications on file.  '  ROS:                                                                                                                                       ROS was performed and is negative except as noted in HPI    General Examination:                                                                                                      Weight 73 kg.  Physical Exam  Constitutional: Appears well-developed and well-nourished.  Psych: Affect appropriate to situation Eyes: Normal external eye and conjunctiva. HENT: Normocephalic, no lesions, without obvious abnormality.   Musculoskeletal-no joint tenderness, deformity or swelling Cardiovascular: Normal rate and regular rhythm.  Respiratory: Effort normal, non-labored breathing saturations WNL GI: Soft.  No distension. There is no tenderness.  Skin: WDI  Neurological Examination Mental Status: Alert, oriented, thought content appropriate. Dysarthria, Speech fluent without evidence of aphasia.  Able to follow  commands without difficulty. Cranial Nerves: II:  Visual fields grossly normal,  III,IV, VI: ptosis not present, extra-ocular motions intact bilaterally, pupils equal, round, reactive to light and accommodation V,VII: smile asymmetric right facial droop, facial light touch sensation normal bilaterally VIII: hearing normal  bilaterally IX,X: uvula rises midline XI: bilateral shoulder shrug XII: midline tongue extension Motor: Right : Upper extremity   5/5  Left:     Upper extremity   5/5  Lower extremity   5/5   Lower extremity   5/5 Tone and bulk:normal tone throughout; no atrophy noted Sensory: reports  decreased sensation in  Right arm Plantars: Right: downgoing   Left: downgoing Cerebellar: No ataxia noted Gait: deferred Lab Results: Basic Metabolic Panel: Recent Labs  Lab 03/05/19 1827  NA 137  K 2.5*  CL 96*  GLUCOSE 226*  BUN 7*  CREATININE 0.60    CBC: Recent Labs  Lab 03/05/19 1827  HGB 17.7*  HCT 52.0*   CBG: Recent Labs  Lab 03/05/19 1820  GLUCAP 229*    Imaging: No results found.     Laurey Morale, MSN, NP-C [facility] [phone]  03/05/2019, 6:30 PM   Attending physician note to follow with Assessment and plan .   Assessment: Belinda Banks is an 63 y.o. female  With PMH HTN who presented to [facility] ED as a code stroke with c/o slurred speech and right side weakness. The [facility] showed hemorrhage so she was not a candidate for TPA. Will admit for stroke work up and management of blood pressure. Potassium is 2.5 Stroke Risk Factors - hypertension    Recommendations: -- BP goal : BP < 140/90  --Echocardiogram -- Prophylactic therapy-Antiplatelet med -- HgbA1c, fasting lipid panel -- PT consult, OT consult, Speech consult --Telemetry monitoring --Frequent neuro checks --Stroke swallow screen  --Correct potassium-   Assessment:   Plan: Subcortical ICH, nontraumatic  CNS Monitor closely  RESP monitor  CV Hypertensive Emergency -Aggressive BP control, goal SBP  <140 unless  -Cleviprex GTT initiated  GI/GU -Gentle hydration -monitor  HEME -Monitor -transfuse for hgb < 7  ENDO -goal HgbA1c < 7   ID -CXR -Monitor   Nutrition E66.9 Obesity  -diet consult  Prophylaxis DVT: SCDS GI: doc/senna  Code Status: Full Code      NEUROHOSPITALIST ADDENDUM Performed a face to face diagnostic evaluation at the time of code stroke  I have reviewed the contents of history and physical exam as documented by PA/ARNP/Resident and agree with above documentation.  I have discussed and formulated the above plan as documented. Edits to the note have been made as needed.   Patient presents with sudden onset facial droop, word finding difficulty and right-sided weakness. NIHSS 4, blood pressure greater than [id number] systolic.  Stat CT head showed a left capsular hemorrhage.  Blood pressure was controlled immediately with stat labetalol and Cleviprex.  This patient is neurologically critically ill due to [place].  He is at risk for significant risk of neurological worsening from cerebral edema,  death from brain herniation, heart failure, hemorrhagic conversion, infection, respiratory failure and seizure. This patient's care requires constant monitoring of vital signs, hemodynamics, respiratory and cardiac monitoring, review of multiple databases, neurological assessment, discussion with family, other specialists and medical decision making of high complexity.  Time spent was independent of time spent by PA.  I spent 45 minutes of neurocritical time in the care of this patient.     Karena Addison Amery Vandenbos MD [facility] [id number]   If 7pm to 7am, please call on call as listed on AMIO   --please page stroke NP  Or  PA  Or MD from 8am -4 pm  as this patient from this time will be  followed by the stroke.   You can look them up on [email or web address]  Password [email or web address]

## 2019-03-05 NOTE — ED Notes (Signed)
Unable to collect Covid test. Per Dr. Aviva Kluver hold off on swabbing due to pt SBP 190-200's, he would like for her SBP to come down some before swabbing.   Pt reports a hx of HTN but has been resolved with diet.

## 2019-03-05 NOTE — ED Triage Notes (Addendum)
Per [facility] pt from home, w.acute onset of garbled speech and Right side facial droop. Pt reports she was at the park with her grandson, arrived home approx 1730 when she noted her speech was off, she went to the bathroom and noticed a facial droop, she was unable to move her Right arm to try and google stroke symptoms. Pt LKW 1730, CBG 229, NIH 5

## 2019-03-05 NOTE — ED Provider Notes (Signed)
[facility] Provider Note   CSN: [id number] Arrival date & time: 03/05/19  1818  An emergency department physician performed an initial assessment on this suspected stroke patient at 1820.  History   Chief Complaint Chief Complaint  Patient presents with  . Code Stroke    HPI Belinda Banks is a 63 y.o. female.     HPI   Patient is a 63 year old female with a history of hypertension who presents to the emergency department today as a code stroke.  Last known well was at 5:30 PM.  Patient states she was sitting on the couch and she noticed that her voice sounded abnormal to her.  She went to the bathroom and noticed in the mirror that she was having facial droop.  She then tried to look at her phone to look up stroke symptoms and realize she was having difficulty moving her right arm.  She got up to walk and felt like her right leg was weak as well.  She denies any headaches.  No chest pain, shortness of breath, or any other symptoms.  Denies any recent fall/trauma.  Was previously treated for hypertension however is no longer being treated for this and has not seen a physician in 7 years.  Past Medical History:  Diagnosis Date  . Hypertension    controlled    Patient Active Problem List   Diagnosis Date Noted  . ICH (intracerebral hemorrhage) ([place]) 03/05/2019    Past Surgical History:  Procedure Laterality Date  . ABDOMINAL HYSTERECTOMY    . BACK SURGERY       OB History   No obstetric history on file.      Home Medications    Prior to Admission medications   Medication Sig Start Date End Date Taking? Authorizing Provider  Artificial Tear Ointment (DRY EYES OP) Place 1 drop into both eyes daily as needed (For dry eyes).   Yes [provider]    Family History History reviewed. No pertinent family history.  Social History Social History   Tobacco Use  . Smoking status: Former Research scientist (life sciences)  . Smokeless tobacco:  Never Used  Substance Use Topics  . Alcohol use: Yes    Alcohol/week: 6.0 standard drinks    Types: 3 Glasses of wine, 3 Standard drinks or equivalent per week    Comment: 5-6 drinks a day   . Drug use: Never     Allergies   Patient has no known allergies.   Review of Systems Review of Systems  Constitutional: Negative for fever.  HENT: Negative for ear pain and sore throat.   Eyes: Negative for visual disturbance.  Respiratory: Negative for cough and shortness of breath.   Cardiovascular: Negative for chest pain.  Gastrointestinal: Negative for abdominal pain, constipation, diarrhea, nausea and vomiting.  Genitourinary: Negative for dysuria and hematuria.  Musculoskeletal: Negative for back pain.  Skin: Negative for rash.  Neurological: Positive for weakness and numbness. Negative for headaches.       Facial droop, slurred speech  All other systems reviewed and are negative.    Physical Exam Updated Vital Signs BP (!) 156/84   Pulse 90   Resp 15   Ht 5\' 8"  (1.727 m)   Wt 73 kg   SpO2 (!) 89%   BMI 24.47 kg/m   Physical Exam Vitals signs and nursing note reviewed.  Constitutional:      General: She is not in acute distress.    Appearance: She is  well-developed.  HENT:     Head: Normocephalic and atraumatic.  Eyes:     Conjunctiva/sclera: Conjunctivae normal.  Neck:     Musculoskeletal: Neck supple.  Cardiovascular:     Rate and Rhythm: Normal rate and regular rhythm.     Pulses: Normal pulses.     Heart sounds: Normal heart sounds. No murmur.  Pulmonary:     Effort: Pulmonary effort is normal. No respiratory distress.     Breath sounds: Normal breath sounds. No wheezing, rhonchi or rales.  Abdominal:     General: Bowel sounds are normal.     Palpations: Abdomen is soft.     Tenderness: There is no abdominal tenderness.  Skin:    General: Skin is warm and dry.  Neurological:     Mental Status: She is alert.     Comments: Mental Status:  Alert,  thought content appropriate, able to give a coherent history. Speech fluent without evidence of aphasia. Able to follow 2 step commands without difficulty.  Cranial Nerves:  II:  pupils equal, round, reactive to light III,IV, VI: ptosis not present, extra-ocular motions intact bilaterally  V,VII: right facial droop, facial light touch sensation equal VIII: hearing grossly normal to voice  X: uvula elevates symmetrically  XI: bilateral shoulder shrug symmetric and strong XII: midline tongue extension without fassiculations Motor:  Normal tone. 4/5 strength to RUE/RLE, 5/5 strength to LUE/LLE Sensory: light touch normal in all extremities. Cerebellar: mild dysmetria with finger to nose of RUE Gait: not assessed +pronator drift of RUE/RLE    ED Treatments / Results  Labs (all labs ordered are listed, but only abnormal results are displayed) Labs Reviewed  COMPREHENSIVE METABOLIC PANEL - Abnormal; Notable for the following components:      Result Value   Potassium 2.4 (*)    Chloride 95 (*)    Glucose, Bld 224 (*)    BUN 7 (*)    AST 86 (*)    ALT 100 (*)    Total Bilirubin 2.1 (*)    Anion gap 18 (*)    All other components within normal limits  I-STAT CHEM 8, ED - Abnormal; Notable for the following components:   Potassium 2.5 (*)    Chloride 96 (*)    BUN 7 (*)    Glucose, Bld 226 (*)    Calcium, Ion 0.92 (*)    Hemoglobin 17.7 (*)    HCT 52.0 (*)    All other components within normal limits  CBG MONITORING, ED - Abnormal; Notable for the following components:   Glucose-Capillary 229 (*)    All other components within normal limits  SARS CORONAVIRUS 2 (TAT 6-24 HRS)  PROTIME-INR  APTT  CBC  DIFFERENTIAL  HIV ANTIBODY (ROUTINE TESTING W REFLEX)    EKG None  Radiology No results found.  Procedures Procedures (including critical care time) CRITICAL CARE Performed by: Rodney Booze   Total critical care time: 31 minutes  Critical care time was exclusive  of separately billable procedures and treating other patients.  Critical care was necessary to treat or prevent imminent or life-threatening deterioration.  Critical care was time spent personally by me on the following activities: development of treatment plan with patient and/or surrogate as well as nursing, discussions with consultants, evaluation of patient's response to treatment, examination of patient, obtaining history from patient or surrogate, ordering and performing treatments and interventions, ordering and review of laboratory studies, ordering and review of radiographic studies, pulse oximetry and re-evaluation of  patient's condition.   Medications Ordered in ED Medications  sodium chloride flush (NS) 0.9 % injection 3 mL (3 mLs Intravenous Not Given 03/05/19 1943)  clevidipine (CLEVIPREX) infusion 0.5 mg/mL (21 mg/hr Intravenous New Bag/Given 03/05/19 1942)  clevidipine (CLEVIPREX) 0.5 MG/ML infusion (has no administration in time range)   stroke: mapping our early stages of recovery book (has no administration in time range)  acetaminophen (TYLENOL) tablet 650 mg (has no administration in time range)    Or  acetaminophen (TYLENOL) 160 MG/5ML solution 650 mg (has no administration in time range)    Or  acetaminophen (TYLENOL) suppository 650 mg (has no administration in time range)  senna-docusate (Senokot-S) tablet 1 tablet (has no administration in time range)  pantoprazole (PROTONIX) injection 40 mg (has no administration in time range)  potassium chloride 10 mEq in 100 mL IVPB (has no administration in time range)  labetalol (NORMODYNE) injection 20 mg (20 mg Intravenous Given 03/05/19 1852)  labetalol (NORMODYNE) injection 20 mg (20 mg Intravenous Given by Other 03/05/19 1830)     Initial Impression / Assessment and Plan / ED Course  I have reviewed the triage vital signs and the nursing notes.  Pertinent labs & imaging results that were available during my care of  the patient were reviewed by me and considered in my medical decision making (see chart for details).     Final Clinical Impressions(s) / ED Diagnoses   Final diagnoses:  Nontraumatic intracerebral hemorrhage, unspecified cerebral location, unspecified laterality ([place])   62 y/o female presenting with stroke like sxs starting at 5:30pm today.   On arrival BP severely elevated, vitals otherwise wnl. On exam, right facial droop, rue/rle pronator drift and is slightly weaker than left. Normal sensation throughout.   Labs reviewed Cbc pending on admission CMP with hypokalemia, elevated LFTs, normal Cr.  INR wnl.  Dr Aroor with neurology at bedside to eval pt. Reviewed CT head which showed intraparenchymal hemorrhage. Labetolol given and cardene initiated. Pt to be admitted to neuro.    ED Discharge Orders    None       Bishop Dublin 03/05/19 2001    Maudie Flakes, MD 03/06/19 1459

## 2019-03-05 NOTE — Code Documentation (Signed)
Patient arrived via EMS at [facility] known well at 1730.  Stat labs and Head CT done.  NIHSS 5: right arm and leg drift, mild decreased sensory and dysarthria.  Dr Aroor at bedside to assess patient.  ICH. Labetalol given IV x2, Cleviprex gtt titrated to 32mg  per orders.  BP 215/110 to 156/84.   Husband at bedside Plan admit to 4N ICU

## 2019-03-06 ENCOUNTER — Inpatient Hospital Stay (HOSPITAL_COMMUNITY): Payer: Managed Care, Other (non HMO)

## 2019-03-06 DIAGNOSIS — F1010 Alcohol abuse, uncomplicated: Secondary | ICD-10-CM

## 2019-03-06 DIAGNOSIS — E876 Hypokalemia: Secondary | ICD-10-CM

## 2019-03-06 DIAGNOSIS — I161 Hypertensive emergency: Secondary | ICD-10-CM

## 2019-03-06 DIAGNOSIS — E1159 Type 2 diabetes mellitus with other circulatory complications: Secondary | ICD-10-CM

## 2019-03-06 DIAGNOSIS — I6389 Other cerebral infarction: Secondary | ICD-10-CM

## 2019-03-06 DIAGNOSIS — E785 Hyperlipidemia, unspecified: Secondary | ICD-10-CM

## 2019-03-06 LAB — GLUCOSE, CAPILLARY
Glucose-Capillary: 319 mg/dL — ABNORMAL HIGH (ref 70–99)
Glucose-Capillary: 274 mg/dL — ABNORMAL HIGH (ref 70–99)
Glucose-Capillary: 254 mg/dL — ABNORMAL HIGH (ref 70–99)

## 2019-03-06 LAB — ECHOCARDIOGRAM COMPLETE
Height: 68 in
Weight: 2574.97 oz

## 2019-03-06 LAB — CBC
HCT: 48.4 % — ABNORMAL HIGH (ref 36.0–46.0)
Hemoglobin: 17.6 g/dL — ABNORMAL HIGH (ref 12.0–15.0)
MCH: 37.9 pg — ABNORMAL HIGH (ref 26.0–34.0)
MCHC: 36.4 g/dL — ABNORMAL HIGH (ref 30.0–36.0)
MCV: 104.3 fL — ABNORMAL HIGH (ref 80.0–100.0)
Platelets: 206 10*3/uL (ref 150–400)
RBC: 4.64 MIL/uL (ref 3.87–5.11)
RDW: 12.9 % (ref 11.5–15.5)
WBC: 12.3 10*3/uL — ABNORMAL HIGH (ref 4.0–10.5)
nRBC: 0 % (ref 0.0–0.2)

## 2019-03-06 LAB — RAPID URINE DRUG SCREEN, HOSP PERFORMED
Amphetamines: NOT DETECTED
Barbiturates: NOT DETECTED
Benzodiazepines: NOT DETECTED
Cocaine: NOT DETECTED
Opiates: NOT DETECTED
Tetrahydrocannabinol: NOT DETECTED

## 2019-03-06 LAB — LIPID PANEL
Cholesterol: 191 mg/dL (ref 0–200)
HDL: 45 mg/dL (ref >40)
LDL Cholesterol: 106 mg/dL — ABNORMAL HIGH (ref 0–99)
Total CHOL/HDL Ratio: 4.2 RATIO
Triglycerides: 200 mg/dL — ABNORMAL HIGH (ref <150)
VLDL: 40 mg/dL (ref 0–40)

## 2019-03-06 LAB — POTASSIUM: Potassium: 3.6 mmol/L (ref 3.5–5.1)

## 2019-03-06 LAB — HEMOGLOBIN A1C
Hgb A1c MFr Bld: 7.6 % — ABNORMAL HIGH (ref 4.8–5.6)
Mean Plasma Glucose: 171.42 mg/dL

## 2019-03-06 LAB — MAGNESIUM: Magnesium: 1.6 mg/dL — ABNORMAL LOW (ref 1.7–2.4)

## 2019-03-06 LAB — HIV ANTIBODY (ROUTINE TESTING W REFLEX): HIV Screen 4th Generation wRfx: NONREACTIVE

## 2019-03-06 LAB — SARS CORONAVIRUS 2 (TAT 6-24 HRS): SARS Coronavirus 2: NEGATIVE

## 2019-03-06 MED ORDER — CHLORHEXIDINE GLUCONATE CLOTH 2 % EX PADS
6.0000 | MEDICATED_PAD | Freq: Every day | CUTANEOUS | Status: DC
  Administered 2019-03-06 – 2019-03-08 (×3): 6 via TOPICAL

## 2019-03-06 MED ORDER — POTASSIUM CHLORIDE 10 MEQ/100ML IV SOLN
10.0000 meq | INTRAVENOUS | Status: AC
  Administered 2019-03-06 (×4): 10 meq via INTRAVENOUS
  Filled 2019-03-06 (×4): qty 100

## 2019-03-06 MED ORDER — LORAZEPAM 2 MG/ML IJ SOLN: 1.0000 mg | INTRAMUSCULAR | Status: AC | PRN

## 2019-03-06 MED ORDER — LORAZEPAM 1 MG PO TABS
1.0000 mg | ORAL_TABLET | ORAL | Status: AC | PRN
  Filled 2019-03-06: qty 1

## 2019-03-06 MED ORDER — AMLODIPINE BESYLATE 10 MG PO TABS: 10.0000 mg | ORAL_TABLET | Freq: Every day | ORAL | Status: DC

## 2019-03-06 MED ORDER — AMLODIPINE BESYLATE 10 MG PO TABS
10.0000 mg | ORAL_TABLET | Freq: Every day | ORAL | Status: DC
  Administered 2019-03-06 – 2019-03-09 (×4): 10 mg via ORAL
  Filled 2019-03-06 (×4): qty 1

## 2019-03-06 MED ORDER — POTASSIUM CHLORIDE CRYS ER 20 MEQ PO TBCR
40.0000 meq | EXTENDED_RELEASE_TABLET | Freq: Once | ORAL | Status: AC
  Administered 2019-03-06: 40 meq via ORAL
  Filled 2019-03-06: qty 2

## 2019-03-06 MED ORDER — PROSIGHT PO TABS
1.0000 | ORAL_TABLET | Freq: Every day | ORAL | Status: DC
  Administered 2019-03-06 – 2019-03-09 (×4): 1 via ORAL
  Filled 2019-03-06 (×4): qty 1

## 2019-03-06 MED ORDER — FOLIC ACID 1 MG PO TABS
1.0000 mg | ORAL_TABLET | Freq: Every day | ORAL | Status: DC
  Administered 2019-03-06 – 2019-03-09 (×4): 1 mg via ORAL
  Filled 2019-03-06 (×4): qty 1

## 2019-03-06 MED ORDER — PANTOPRAZOLE SODIUM 40 MG PO TBEC
40.0000 mg | DELAYED_RELEASE_TABLET | Freq: Every day | ORAL | Status: DC
  Administered 2019-03-06 – 2019-03-09 (×4): 40 mg via ORAL
  Filled 2019-03-06 (×4): qty 1

## 2019-03-06 MED ORDER — LOSARTAN POTASSIUM 50 MG PO TABS
50.0000 mg | ORAL_TABLET | Freq: Every day | ORAL | Status: DC
  Administered 2019-03-06 – 2019-03-07 (×2): 50 mg via ORAL
  Filled 2019-03-06 (×2): qty 1

## 2019-03-06 MED ORDER — LABETALOL HCL 5 MG/ML IV SOLN
5.0000 mg | INTRAVENOUS | Status: DC | PRN
  Filled 2019-03-06: qty 4

## 2019-03-06 MED ORDER — INSULIN ASPART 100 UNIT/ML ~~LOC~~ SOLN
0.0000 [IU] | Freq: Three times a day (TID) | SUBCUTANEOUS | Status: DC
  Administered 2019-03-06: 7 [IU] via SUBCUTANEOUS
  Administered 2019-03-06: 5 [IU] via SUBCUTANEOUS
  Administered 2019-03-07: 2 [IU] via SUBCUTANEOUS
  Administered 2019-03-07: 9 [IU] via SUBCUTANEOUS

## 2019-03-06 MED ORDER — IOHEXOL 350 MG/ML SOLN
100.0000 mL | Freq: Once | INTRAVENOUS | Status: AC | PRN
  Administered 2019-03-06: 100 mL via INTRAVENOUS

## 2019-03-06 NOTE — Plan of Care (Signed)
Pt ambulates to the bathroom with minimal assistance

## 2019-03-06 NOTE — Evaluation (Signed)
Physical Therapy Evaluation Patient Details Name: Belinda Banks MRN: [id number] DOB: 1955/10/20 Today's Date: 03/06/2019   History of Present Illness  63 y.o. female  With PMH HTN who [facility] as a code stroke with c/o slurred speech and right side weakness. NIHSS 4; SBP >240; CTH showed left basal ganglia hemorrhage so she was not a candidate for TPA. CT angio head no aneurysm, dissection or significant stenosis; PMH HTN   Clinical Impression   Pt admitted with above diagnosis. Limited evaluation due to elevated BP at EOB. Patient does have RLE weakness and anticipate will have gait/imbalance deficits. Will further assess as BP allows. Pt currently with functional limitations due to the deficits listed below (see PT Problem List). Pt will benefit from skilled PT to increase their independence and safety with mobility to allow discharge to the venue listed below.       Follow Up Recommendations Outpatient PT;Supervision for mobility/OOB(TBD as further activity/balance can be assessed)    Equipment Recommendations  Other (comment)(TBD as further activity/balance can be assessed)    Recommendations for Other Services       Precautions / Restrictions Precautions Precautions: Fall      Mobility  Bed Mobility Overal bed mobility: Needs Assistance Bed Mobility: Supine to Sit;Sit to Supine     Supine to sit: Supervision Sit to supine: Supervision   General bed mobility comments: reliant on use of bedrails; HOB 0  Transfers                 General transfer comment: unable to attempt due to elevated BP   Ambulation/Gait             General Gait Details: unable to attempt due to elevated BP   Stairs            Wheelchair Mobility    Modified Rankin (Stroke Patients Only) Modified Rankin (Stroke Patients Only) Pre-Morbid Rankin Score: No symptoms Modified Rankin: Moderately severe disability     Balance Overall balance assessment: Needs  assistance Sitting-balance support: Feet supported Sitting balance-Leahy Scale: Good Sitting balance - Comments: static sitting WFL; able to shift weight anteriorly and left to right to adjust her gown and reach feet to floor with no imbalance                                     Pertinent Vitals/Pain Pain Assessment: No/denies pain    Home Living Family/patient expects to be discharged to:: Private residence Living Arrangements: Spouse/significant other Available Help at Discharge: Family;Available 24 hours/day(spouse retired) Type of Home: House Home Access: Stairs to enter Entrance Stairs-Rails: Right Technical brewer of Steps: 6 Home Layout: One level Home Equipment: None      Prior Function Level of Independence: Independent         Comments: currently working from home; Audiological scientist [employer]   Dominant Hand: Right    Extremity/Trunk Assessment   Upper Extremity Assessment Upper Extremity Assessment: Defer to OT evaluation RUE Deficits / Details: grossly 4/5  incoordination noted Rt hand.  Pt demonstrates difficulty manipulating utensils with Rt UE and noted to drop items.  She denies sensory changes  RUE Coordination: decreased fine motor;decreased gross motor    Lower Extremity Assessment Lower Extremity Assessment: RLE deficits/detail RLE Deficits / Details: ankle DF 4/5    Cervical / Trunk Assessment Cervical / Trunk Assessment: Normal  Communication  Communication: Expressive difficulties(dysarthria noted )  Cognition Arousal/Alertness: Awake/alert Behavior During Therapy: WFL for tasks assessed/performed Overall Cognitive Status: Within Functional Limits for tasks assessed                                 General Comments: grossly [facility]       General Comments General comments (skin integrity, edema, etc.): BP sitting EOB 175/101.  Alerted both MD and RN.  MD requests pt return to supine.  Eval terminated at  that time.     Exercises     Assessment/Plan    PT Assessment Patient needs continued PT services  PT Problem List Decreased strength;Decreased activity tolerance;Decreased balance;Decreased mobility;Decreased knowledge of use of DME;Cardiopulmonary status limiting activity       PT Treatment Interventions DME instruction;Gait training;Stair training;Functional mobility training;Therapeutic activities;Therapeutic exercise;Balance training;Neuromuscular re-education;Patient/family education    PT Goals (Current goals can be found in the Care Plan section)  Acute Rehab PT Goals Patient Stated Goal: to get back to normal and to get back to work  PT Goal Formulation: With patient Time For Goal Achievement: 03/20/19 Potential to Achieve Goals: Good    Frequency Min 4X/week   Barriers to discharge        Co-evaluation PT/OT/SLP Co-Evaluation/Treatment: Yes Reason for Co-Treatment: For patient/therapist safety;To address functional/ADL transfers PT goals addressed during session: Mobility/safety with mobility;Balance OT goals addressed during session: ADL's and self-care       AM-PAC PT "6 Clicks" Mobility  Outcome Measure Help needed turning from your back to your side while in a flat bed without using bedrails?: None Help needed moving from lying on your back to sitting on the side of a flat bed without using bedrails?: A Little Help needed moving to and from a bed to a chair (including a wheelchair)?: A Little Help needed standing up from a chair using your arms (e.g., wheelchair or bedside chair)?: A Little Help needed to walk in hospital room?: A Little Help needed climbing 3-5 steps with a railing? : A Lot 6 Click Score: 18    End of Session   Activity Tolerance: Treatment limited secondary to medical complications (Comment) Patient left: in bed;with call bell/phone within reach;with bed alarm set Nurse Communication: Other (comment)(elevated BP with sit EOB and  returned to supine) PT Visit Diagnosis: Hemiplegia and hemiparesis Hemiplegia - Right/Left: Right Hemiplegia - dominant/non-dominant: Dominant Hemiplegia - caused by: Other Nontraumatic intracranial hemorrhage    Time: [id number] PT Time Calculation (min) (ACUTE ONLY): 19 min   Charges:   PT Evaluation $PT Eval Moderate Complexity: 1 Mod           Barry Brunner, PT Pager [phone]   Rexanne Mano 03/06/2019, 1:35 PM

## 2019-03-06 NOTE — Progress Notes (Signed)
  Echocardiogram 2D Echocardiogram has been performed.  Jonnathan Birman A Lynisha Osuch 03/06/2019, 3:30 PM

## 2019-03-06 NOTE — Plan of Care (Signed)
Pt ambulates to the bathroom with assistance. 

## 2019-03-06 NOTE — Progress Notes (Signed)
CRITICAL VALUE ALERT  Critical Value:  2.5K+  Date & Time Notied:  11/15 0611  Provider Notified:Dr Leonel Ramsay  Orders Received/Actions taken: Awaiting MD orders

## 2019-03-06 NOTE — Evaluation (Signed)
Speech Language Pathology Evaluation Patient Details Name: Belinda Banks MRN: [id number] DOB: 1956-03-04 Today's Date: 03/06/2019 Time: 1001-1020 SLP Time Calculation (min) (ACUTE ONLY): 19 min  Problem List:  Patient Active Problem List   Diagnosis Date Noted  . ICH (intracerebral hemorrhage) ([place]) 03/05/2019   Past Medical History:  Past Medical History:  Diagnosis Date  . Hypertension    controlled   Past Surgical History:  Past Surgical History:  Procedure Laterality Date  . ABDOMINAL HYSTERECTOMY    . BACK SURGERY     HPI:  63 year old female admitted with right sided weakness. CT showed 2.1 x 1.3 x 2.8 cm acute intraparenchymal hemorrhage centered at the left lentiform nucleus, estimated volume 3.8 cc. No significant regional mass effect.   Assessment / Plan / Recommendation Clinical Impression  Patient presents with only mild dysarthria due to right sided facial weakness and ROM s/p acute CVA. All other areas of cognitive-linguistic function WFL. Patient 75-100% intelligible at the conversation level. SLP provided verbal instruction for use of basic OME and compensatory strategies to improve intelligibility. Patient verbalized understanding but would benefit from brief f/u for reinforcement as well as possible OP SLP f/u should deficits not resolve. `    SLP Assessment  SLP Recommendation/Assessment: Patient needs continued Speech Lanaguage Pathology Services SLP Visit Diagnosis: Dysarthria and anarthria (R47.1)    Follow Up Recommendations  Outpatient SLP    Frequency and Duration min 1 x/week  1 week      SLP Evaluation Cognition  Overall Cognitive Status: Within Functional Limits for tasks assessed Orientation Level: Oriented X4       Comprehension  Auditory Comprehension Overall Auditory Comprehension: Appears within functional limits for tasks assessed Visual Recognition/Discrimination Discrimination: Within Function Limits Reading  Comprehension Reading Status: Within funtional limits    Expression Expression Primary Mode of Expression: Verbal Verbal Expression Overall Verbal Expression: Appears within functional limits for tasks assessed Written Expression Dominant Hand: Right   Oral / Motor  Oral Motor/Sensory Function Overall Oral Motor/Sensory Function: Mild impairment Facial ROM: Reduced right;Suspected CN VII (facial) dysfunction Facial Symmetry: Abnormal symmetry right;Suspected CN VII (facial) dysfunction Facial Strength: Reduced right;Suspected CN VII (facial) dysfunction Facial Sensation: Within Functional Limits Lingual ROM: Within Functional Limits Lingual Symmetry: Within Functional Limits Lingual Strength: Within Functional Limits Lingual Sensation: Within Functional Limits Velum: Within Functional Limits Mandible: Within Functional Limits Motor Speech Overall Motor Speech: Impaired Respiration: Within functional limits Phonation: Normal Resonance: Within functional limits Articulation: Impaired Level of Impairment: Word Intelligibility: Intelligibility reduced Word: 75-100% accurate Phrase: 75-100% accurate Sentence: 75-100% accurate Conversation: 75-100% accurate Motor Planning: Witnin functional limits   GO                   Carlisle Enke MA, CCC-SLP   Jerrico Covello Meryl 03/06/2019, 10:24 AM

## 2019-03-06 NOTE — Plan of Care (Signed)
  Problem: Education: Goal: Knowledge of disease or condition will improve Outcome: Progressing   

## 2019-03-06 NOTE — Progress Notes (Signed)
STROKE TEAM PROGRESS NOTE   INTERVAL HISTORY No family is at the bedside.  Pt AAO x 3, denies HA. Still has mild right facial droop and right hand clumsy. BP under control with IV cleviprex. Put on po BP meds. CT repeat this am showed stable hematoma. Pt admitted that she drinks 4-6 drinks per day. Will put on CIWA protocol.    OBJECTIVE Vitals:   03/06/19 0500 03/06/19 0515 03/06/19 0530 03/06/19 0545  BP: 137/83 129/80 122/81 112/81  Pulse: 88 79 78 77  Resp: 15 16 14 15   Temp:      TempSrc:      SpO2: 95% 94% 93% 94%  Weight:      Height:        CBC:  Recent Labs  Lab 03/05/19 1826 03/05/19 1827  WBC 11.5*  --   NEUTROABS 8.6*  --   HGB 18.7* 17.7*  HCT RESULTS UNAVAILABLE DUE TO INTERFERING SUBSTANCE 52.0*  MCV RESULTS UNAVAILABLE DUE TO INTERFERING SUBSTANCE  --   PLT 176  --     Basic Metabolic Panel:  Recent Labs  Lab 03/05/19 1826 03/05/19 1827 03/06/19 0445  NA 137 137 136  K 2.4* 2.5* 2.5*  CL 95* 96* 94*  CO2 24  --  26  GLUCOSE 224* 226* 279*  BUN 7* 7* 5*  CREATININE 0.71 0.60 0.64  CALCIUM 9.1  --  8.4*    Lipid Panel:     Component Value Date/Time   CHOL 191 03/06/2019 0445   TRIG 200 (H) 03/06/2019 0445   HDL 45 03/06/2019 0445   CHOLHDL 4.2 03/06/2019 0445   VLDL 40 03/06/2019 0445   LDLCALC 106 (H) 03/06/2019 0445   HgbA1c: No results found for: HGBA1C Urine Drug Screen: No results found for: LABOPIA, COCAINSCRNUR, LABBENZ, AMPHETMU, THCU, LABBARB  Alcohol Level No results found for: ETH  IMAGING  Ct Angio Head W Or Wo Contrast Ct Angio Neck W Or Wo Contrast 03/06/2019 IMPRESSION:  1. No aneurysm, dissection or hemodynamically significant stenosis of the major cervical or intracranial arteries.  2. Unchanged size of hypertensive hemorrhage in left basal ganglia.   Ct Head Code Stroke Wo Contrast 03/05/2019 IMPRESSION:  1. 2.1 x 1.3 x 2.8 cm acute intraparenchymal hemorrhage centered at the left lentiform nucleus, estimated  volume 3.8 cc. No significant regional mass effect.  2. Underlying mild age-related cerebral atrophy with chronic small vessel ischemic disease.    Transthoracic Echocardiogram  Pending   ECG - ST rate 101 BPM. (See cardiology reading for complete details)   PHYSICAL EXAM  Temp:  [98.4 F (36.9 C)-99.1 F (37.3 C)] 98.4 F (36.9 C) (11/15 0800) Pulse Rate:  [71-107] 88 (11/15 0700) Resp:  [13-26] 19 (11/15 0700) BP: (112-206)/(66-140) 112/81 (11/15 0545) SpO2:  [89 %-100 %] 94 % (11/15 0545) Weight:  [73 kg] 73 kg (11/14 1838)  General - Well nourished, well developed, in no apparent distress.  Ophthalmologic - fundi not visualized due to noncooperation.  Cardiovascular - Regular rhythm and rate.  Mental Status -  Level of arousal and orientation to time, place, and person were intact. Language including expression, naming, repetition, comprehension was assessed and found intact. Fund of Knowledge was assessed and was intact.  Cranial Nerves II - XII - II - Visual field intact OU. III, IV, VI - Extraocular movements intact. V - Facial sensation intact bilaterally. VII - mild left facial droop. VIII - Hearing & vestibular intact bilaterally. X - Palate elevates symmetrically.  XI - Chin turning & shoulder shrug intact bilaterally. XII - Tongue protrusion intact.  Motor Strength - The patient's strength was normal in all extremities except left pronator drift was absent and left hand dexterity difficulty.  Bulk was normal and fasciculations were absent.   Motor Tone - Muscle tone was assessed at the neck and appendages and was normal.  Reflexes - The patient's reflexes were symmetrical in all extremities and she had no pathological reflexes.  Sensory - Light touch, temperature/pinprick were assessed and were symmetrical.    Coordination - The patient had normal movements in the hands and feet with no ataxia or dysmetria.  Tremor was absent.  Gait and Station -  deferred.   ASSESSMENT/PLAN Belinda Banks is a 63 y.o. female with history of HTN (no recent medical f/u) who presented to [facility] ED as a code stroke with c/o slurred speech, right sided weakness, facial droop, BP 230/158 and glucose 238. She did not receive IV t-PA due to [place].  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  Code Stroke CT Head -  Small ICH centered at the left lentiform nucleus, estimated volume 3.8 cc.   CTA H&N -  Unchanged size of hypertensive hemorrhage in left basal ganglia. No aneurysm or AVM    2D Echo - pending  Sars Corona Virus 2 - negative  LDL - 106  HgbA1c - 7.6  UDS - pending  VTE prophylaxis -  SCDs  No antithrombotic prior to admission, now on No antithrombotic  Ongoing aggressive stroke risk factor management  Therapy recommendations:  pending  Disposition:  Pending  Hypertensive emergency  Home BP meds: none   Current BP meds: Norvasc ; Cozaar ; Cleviprex ; Labetalol prn  Stable . SBP goal - 120 - 160 now given stable hematoma . Long-term BP goal normotensive  Alcohol abuse  Pt admitted 4-6 drinks per day  Now on B1/FA/MVI  Put on CIWA protocol   advised to drink no more than 1 alcoholic beverage per day  Hyperlipidemia  Home Lipid lowering medication: none   LDL 106, goal < 70  Current lipid lowering medication: none.  Will consider statin at time of discharge  Diabetes, new diagnosis  Home diabetic meds: none  Current diabetic meds: none  HgbA1c 7.6, goal < 7.0  hyperglycemia  SSI  CBG nonitoring  DM educator consult placed  Other Stroke Risk Factors  Former cigarette smoker - quit  Other Active Problems  Hypokalemia - 2.5 -> supplemented  WBC - 11.5->12.3 (afebrile) (daily labs)  Hospital day # 1  This patient is critically ill due to [place], hypertensive emergency, DM, alcohol abuse and at significant risk of neurological worsening, death form hematoma expansion, DKA, DT,  alcohol withdraw seizure, encephalopathy. This patient's care requires constant monitoring of vital signs, hemodynamics, respiratory and cardiac monitoring, review of multiple databases, neurological assessment, discussion with family, other specialists and medical decision making of high complexity. I spent 35 minutes of neurocritical care time in the care of this patient.  Rosalin Hawking, MD PhD Stroke Neurology 03/06/2019 10:04 AM   To contact Stroke Continuity provider, please refer to [email or web address] After hours, contact General Neurology

## 2019-03-06 NOTE — Evaluation (Signed)
Occupational Therapy Evaluation Patient Details Name: Belinda Banks MRN: [id number] DOB: Sep 18, 1955 Today's Date: 03/06/2019    History of Present Illness 63 y.o. female  With PMH HTN who presented to [facility] ED as a code stroke with c/o slurred speech and right side weakness. NIHSS 4; SBP >240; CTH showed left basal ganglia hemorrhage so she was not a candidate for TPA. CT angio head no aneurysm, dissection or significant stenosis; PMH HTN    Clinical Impression   Pt admitted with above. She demonstrates the below listed deficits and will benefit from continued OT to maximize safety and independence with BADLs.  Pt presents to OT with weakness Rt UE as well as incoordination, decreased activity tolerance, nystagmus with Rt inferior gaze.  Evaluation limited to bed level activity and EOB sitting due to BP 175/101.   She lives with her spouse and was fully independent PTA including working full time in [employer] (currenlty works from home).   Will follow acutely.  Anticipate she will require OPOT, but this may change depending on her progress.       Follow Up Recommendations  Outpatient OT;Supervision - Intermittent    Equipment Recommendations  Tub/shower seat    Recommendations for Other Services       Precautions / Restrictions Precautions Precautions: Fall      Mobility Bed Mobility Overal bed mobility: Needs Assistance Bed Mobility: Supine to Sit;Sit to Supine     Supine to sit: Supervision Sit to supine: Supervision   General bed mobility comments: reliant on use of bedrails   Transfers                 General transfer comment: unable to attempt due to elevated BP     Balance Overall balance assessment: Needs assistance Sitting-balance support: Feet supported Sitting balance-Leahy Scale: Fair Sitting balance - Comments: static sitting WFL                                    ADL either performed or assessed with clinical judgement   ADL Overall  ADL's : Needs assistance/impaired Eating/Feeding: Set up;Sitting   Grooming: Wash/dry hands;Oral care;Wash/dry face;Brushing hair;Set up;Supervision/safety;Bed level   Upper Body Bathing: Set up;Bed level   Lower Body Bathing: Moderate assistance;Bed level   Upper Body Dressing : Sitting;Minimal assistance   Lower Body Dressing: Moderate assistance;Bed level                 General ADL Comments: eval limited due to elevated BP      Vision Baseline Vision/History: Wears glasses Wears Glasses: Reading only Patient Visual Report: No change from baseline Vision Assessment?: Yes Eye Alignment: Within Functional Limits Ocular Range of Motion: Within Functional Limits Alignment/Gaze Preference: Within Defined Limits Tracking/Visual Pursuits: Decreased smoothness of eye movement to RIGHT inferior field Visual Fields: No apparent deficits Additional Comments: Pt with rotational nystagmus noted with Rt inferior gaze      Perception Perception Perception Tested?: Yes   Praxis Praxis Praxis tested?: Within functional limits    Pertinent Vitals/Pain Pain Assessment: No/denies pain     Hand Dominance Right   Extremity/Trunk Assessment Upper Extremity Assessment Upper Extremity Assessment: RUE deficits/detail RUE Deficits / Details: grossly 4/5  incoordination noted Rt hand.  Pt demonstrates difficulty manipulating utensils with Rt UE and noted to drop items.  She denies sensory changes  RUE Coordination: decreased fine motor;decreased gross motor   Lower Extremity Assessment  Lower Extremity Assessment: Defer to PT evaluation   Cervical / Trunk Assessment Cervical / Trunk Assessment: Normal   Communication Communication Communication: Expressive difficulties(dysarthria noted )   Cognition Arousal/Alertness: Awake/alert Behavior During Therapy: WFL for tasks assessed/performed Overall Cognitive Status: Within Functional Limits for tasks assessed                                  General Comments: grossly WFL    General Comments  BP sitting EOB 175/101.  Alerted both MD and RN.  MD requests pt return to supine.  Eval terminated at that time.     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; Press photographer        OT Problem List: Decreased strength;Decreased activity tolerance;Impaired balance (sitting and/or standing);Impaired vision/perception;Decreased coordination;Decreased knowledge of use of DME or AE;Cardiopulmonary status limiting activity;Impaired UE functional use      OT Treatment/Interventions: Self-care/ADL training;Neuromuscular education;DME and/or AE instruction;Therapeutic activities;Visual/perceptual remediation/compensation;Patient/family education;Balance training    OT Goals(Current goals can be found in the care plan section) Acute Rehab OT Goals Patient Stated Goal: to get back to normal and to get back to work  OT Goal Formulation: With patient/family Time For Goal Achievement: 03/20/19 Potential to Achieve Goals: Good ADL Goals Pt Will Perform Grooming: with modified independence;standing Pt Will Perform Upper Body Bathing: with modified independence;sitting Pt Will Perform Lower Body Bathing: with modified independence;sit to/from stand Pt Will Perform Upper Body Dressing: with modified independence;sitting Pt Will Perform Lower Body  Dressing: with modified independence;sit to/from stand Pt Will Transfer to Toilet: with modified independence;ambulating;regular height toilet Pt Will Perform Toileting - Clothing Manipulation and hygiene: with modified independence;sit to/from stand Pt Will Perform Tub/Shower Transfer: Shower transfer;ambulating Pt/caregiver will Perform Home Exercise Program: With written HEP provided;Independently  OT Frequency: Min 2X/week   Barriers to D/C:            Co-evaluation PT/OT/SLP Co-Evaluation/Treatment: Yes Reason for Co-Treatment: To address functional/ADL transfers;For patient/therapist safety   OT goals addressed during session: ADL's and self-care      AM-PAC OT "6 Clicks" Daily Activity     Outcome Measure Help from another person eating meals?: None Help from another person taking care of personal grooming?: A Little Help from another person toileting, which includes using toliet, bedpan, or urinal?: A Little Help from another person bathing (including washing, rinsing, drying)?: A Little Help from another person to put on and taking off regular upper body clothing?: A Little Help from another person to put on and taking off regular lower body clothing?: A Little 6 Click Score: 19   End of Session Nurse Communication: Mobility status;Other (comment)(elevated BP)  Activity Tolerance:   Patient left: in bed;with call bell/phone within reach;with family/visitor present  OT Visit Diagnosis: Unsteadiness on feet (R26.81);Muscle weakness (generalized) (M62.81);Hemiplegia and hemiparesis Hemiplegia - Right/Left: Right Hemiplegia - dominant/non-dominant: Dominant  Hemiplegia - caused by: Other Nontraumatic intracranial hemorrhage                Time: 1028-1058 OT Time Calculation (min): 30 min Charges:  OT General Charges $OT Visit: 1 Visit OT Evaluation $OT Eval Moderate Complexity: 1 Mod  Lucille Passy, OTR/L Acute Rehabilitation Services Pager [phone] Office  [phone]   Lucille Passy M 03/06/2019, 11:21 AM

## 2019-03-07 DIAGNOSIS — Z7289 Other problems related to lifestyle: Secondary | ICD-10-CM

## 2019-03-07 DIAGNOSIS — Z789 Other specified health status: Secondary | ICD-10-CM

## 2019-03-07 DIAGNOSIS — E119 Type 2 diabetes mellitus without complications: Secondary | ICD-10-CM

## 2019-03-07 DIAGNOSIS — I10 Essential (primary) hypertension: Secondary | ICD-10-CM | POA: Diagnosis present

## 2019-03-07 LAB — CBC
HCT: 44.4 % (ref 36.0–46.0)
Hemoglobin: 16.3 g/dL — ABNORMAL HIGH (ref 12.0–15.0)
MCH: 36.5 pg — ABNORMAL HIGH (ref 26.0–34.0)
MCHC: 36.7 g/dL — ABNORMAL HIGH (ref 30.0–36.0)
MCV: 99.6 fL (ref 80.0–100.0)
Platelets: 180 10*3/uL (ref 150–400)
RBC: 4.46 MIL/uL (ref 3.87–5.11)
RDW: 12 % (ref 11.5–15.5)
WBC: 9.3 10*3/uL (ref 4.0–10.5)
nRBC: 0 % (ref 0.0–0.2)

## 2019-03-07 LAB — BASIC METABOLIC PANEL
Anion gap: 16 — ABNORMAL HIGH (ref 5–15)
Anion gap: 11 (ref 5–15)
BUN: 5 mg/dL — ABNORMAL LOW (ref 8–23)
BUN: 6 mg/dL — ABNORMAL LOW (ref 8–23)
CO2: 26 mmol/L (ref 22–32)
CO2: 25 mmol/L (ref 22–32)
Calcium: 8.4 mg/dL — ABNORMAL LOW (ref 8.9–10.3)
Calcium: 8.5 mg/dL — ABNORMAL LOW (ref 8.9–10.3)
Chloride: 94 mmol/L — ABNORMAL LOW (ref 98–111)
Chloride: 100 mmol/L (ref 98–111)
Creatinine, Ser: 0.64 mg/dL (ref 0.44–1.00)
Creatinine, Ser: 0.74 mg/dL (ref 0.44–1.00)
GFR calc Af Amer: >60 mL/min (ref >60)
GFR calc Af Amer: >60 mL/min (ref >60)
GFR calc non Af Amer: >60 mL/min (ref >60)
GFR calc non Af Amer: >60 mL/min (ref >60)
Glucose, Bld: 279 mg/dL — ABNORMAL HIGH (ref 70–99)
Glucose, Bld: 212 mg/dL — ABNORMAL HIGH (ref 70–99)
Potassium: 2.5 mmol/L — CL (ref 3.5–5.1)
Potassium: 3.2 mmol/L — ABNORMAL LOW (ref 3.5–5.1)
Sodium: 136 mmol/L (ref 135–145)
Sodium: 136 mmol/L (ref 135–145)

## 2019-03-07 LAB — GLUCOSE, CAPILLARY
Glucose-Capillary: 173 mg/dL — ABNORMAL HIGH (ref 70–99)
Glucose-Capillary: 365 mg/dL — ABNORMAL HIGH (ref 70–99)
Glucose-Capillary: 178 mg/dL — ABNORMAL HIGH (ref 70–99)
Glucose-Capillary: 156 mg/dL — ABNORMAL HIGH (ref 70–99)

## 2019-03-07 MED ORDER — LOSARTAN POTASSIUM 50 MG PO TABS
50.0000 mg | ORAL_TABLET | Freq: Two times a day (BID) | ORAL | Status: DC
  Administered 2019-03-07 – 2019-03-09 (×4): 50 mg via ORAL
  Filled 2019-03-07 (×4): qty 1

## 2019-03-07 MED ORDER — LIVING WELL WITH DIABETES BOOK
Freq: Once | Status: AC
  Administered 2019-03-07: 16:00:00
  Filled 2019-03-07: qty 1

## 2019-03-07 MED ORDER — MAGNESIUM SULFATE 2 GM/50ML IV SOLN
2.0000 g | Freq: Once | INTRAVENOUS | Status: AC
  Administered 2019-03-07: 2 g via INTRAVENOUS
  Filled 2019-03-07: qty 50

## 2019-03-07 MED ORDER — INSULIN STARTER KIT- PEN NEEDLES (ENGLISH)
1.0000 | Freq: Once | Status: DC
  Filled 2019-03-07: qty 1

## 2019-03-07 MED ORDER — LABETALOL HCL 100 MG PO TABS
100.0000 mg | ORAL_TABLET | Freq: Three times a day (TID) | ORAL | Status: DC
  Administered 2019-03-07 – 2019-03-09 (×7): 100 mg via ORAL
  Filled 2019-03-07 (×7): qty 1

## 2019-03-07 MED ORDER — INSULIN ASPART 100 UNIT/ML ~~LOC~~ SOLN: 0.0000 [IU] | Freq: Every day | SUBCUTANEOUS | Status: DC

## 2019-03-07 MED ORDER — INSULIN ASPART 100 UNIT/ML ~~LOC~~ SOLN
0.0000 [IU] | Freq: Three times a day (TID) | SUBCUTANEOUS | Status: DC
  Administered 2019-03-07 – 2019-03-08 (×2): 2 [IU] via SUBCUTANEOUS
  Administered 2019-03-08: 09:00:00 1 [IU] via SUBCUTANEOUS
  Administered 2019-03-08: 3 [IU] via SUBCUTANEOUS
  Administered 2019-03-09: 08:00:00 1 [IU] via SUBCUTANEOUS
  Administered 2019-03-09: 2 [IU] via SUBCUTANEOUS

## 2019-03-07 MED ORDER — METFORMIN HCL 500 MG PO TABS
500.0000 mg | ORAL_TABLET | Freq: Two times a day (BID) | ORAL | Status: DC
  Administered 2019-03-07 – 2019-03-09 (×5): 500 mg via ORAL
  Filled 2019-03-07 (×5): qty 1

## 2019-03-07 MED ORDER — POTASSIUM CHLORIDE CRYS ER 20 MEQ PO TBCR
40.0000 meq | EXTENDED_RELEASE_TABLET | ORAL | Status: AC
  Administered 2019-03-07 (×2): 40 meq via ORAL
  Filled 2019-03-07 (×2): qty 2

## 2019-03-07 MED ORDER — INSULIN ASPART 100 UNIT/ML ~~LOC~~ SOLN
4.0000 [IU] | Freq: Three times a day (TID) | SUBCUTANEOUS | Status: DC
  Administered 2019-03-07 – 2019-03-09 (×6): 4 [IU] via SUBCUTANEOUS

## 2019-03-07 NOTE — Progress Notes (Signed)
Physical Therapy Treatment Patient Details Name: Belinda Banks MRN: [id number] DOB: 1956/01/06 Today's Date: 03/07/2019    History of Present Illness 63 y.o. female  With PMH HTN who presented to [facility] ED as a code stroke with c/o slurred speech and right side weakness. NIHSS 4; SBP >240; CTH showed left basal ganglia hemorrhage so she was not a candidate for TPA. CT angio head no aneurysm, dissection or significant stenosis; PMH HTN     PT Comments    Well improved.  Emphasis on high level gait/balance activity with no overt deviation other than mild right circumduction with fatigue.  May progress out of need for follow up PT at OP   Follow Up Recommendations  Outpatient PT;Supervision/Assistance - 24 hour     Equipment Recommendations  None recommended by PT    Recommendations for Other Services       Precautions / Restrictions Precautions Precautions: Fall Restrictions Weight Bearing Restrictions: No    Mobility  Bed Mobility Overal bed mobility: Needs Assistance Bed Mobility: Supine to Sit     Supine to sit: Modified independent (Device/Increase time)        Transfers Overall transfer level: Needs assistance   Transfers: Sit to/from Stand;Stand Pivot Transfers Sit to Stand: Modified independent (Device/Increase time) Stand pivot transfers: Modified independent (Device/Increase time)          Ambulation/Gait Ambulation/Gait assistance: Supervision;Modified independent (Device/Increase time) Gait Distance (Feet): 330 Feet Assistive device: None Gait Pattern/deviations: Step-through pattern   Gait velocity interpretation: >4.37 ft/sec, indicative of normal walking speed General Gait Details: steady gait with small amount of circumduction and foot slap R LE with fatigue.  This did not cause any balance disruption.  Pt able to maintain balance with moderate challenge including scanning, abrupt directional changes, increases in gait speed to age appropriate  levels, backing up and negotiating stairs.   Stairs Stairs: Yes   Stair Management: One rail Right;Alternating pattern;Forwards Number of Stairs: 2 General stair comments: safe with the rail   Wheelchair Mobility    Modified Rankin (Stroke Patients Only) Modified Rankin (Stroke Patients Only) Pre-Morbid Rankin Score: No symptoms Modified Rankin: Slight disability     Balance Overall balance assessment: Needs assistance   Sitting balance-Leahy Scale: Good       Standing balance-Leahy Scale: Good                              Cognition Arousal/Alertness: Awake/alert Behavior During Therapy: WFL for tasks assessed/performed Overall Cognitive Status: Within Functional Limits for tasks assessed                                        Exercises      General Comments        Pertinent Vitals/Pain Pain Assessment: No/denies pain    Home Living                      Prior Function            PT Goals (current goals can now be found in the care plan section) Acute Rehab PT Goals Patient Stated Goal: to get back to normal and to get back to work  PT Goal Formulation: With patient Time For Goal Achievement: 03/20/19 Potential to Achieve Goals: Good Progress towards PT goals: Progressing toward goals    Frequency  Min 4X/week      PT Plan Current plan remains appropriate    Co-evaluation PT/OT/SLP Co-Evaluation/Treatment: Yes Reason for Co-Treatment: For patient/therapist safety;To address functional/ADL transfers PT goals addressed during session: Mobility/safety with mobility;Balance OT goals addressed during session: ADL's and self-care      AM-PAC PT "6 Clicks" Mobility   Outcome Measure  Help needed turning from your back to your side while in a flat bed without using bedrails?: None Help needed moving from lying on your back to sitting on the side of a flat bed without using bedrails?: None Help needed  moving to and from a bed to a chair (including a wheelchair)?: None Help needed standing up from a chair using your arms (e.g., wheelchair or bedside chair)?: None Help needed to walk in hospital room?: None Help needed climbing 3-5 steps with a railing? : None 6 Click Score: 24    End of Session   Activity Tolerance: Treatment limited secondary to medical complications (Comment) Patient left: in chair;with call bell/phone within reach Nurse Communication: Mobility status PT Visit Diagnosis: Other symptoms and signs involving the nervous system (R29.898);Unsteadiness on feet (R26.81) Hemiplegia - dominant/non-dominant: Dominant Hemiplegia - caused by: Other Nontraumatic intracranial hemorrhage     Time: 1510-1536 PT Time Calculation (min) (ACUTE ONLY): 26 min  Charges:  $Gait Training: 8-22 mins                     03/07/2019  Donnella Sham, PT Acute Rehabilitation Services [phone]  (pager) [phone]  (office)   Tessie Fass Denean Pavon 03/07/2019, 4:04 PM

## 2019-03-07 NOTE — Progress Notes (Addendum)
Inpatient Diabetes Program Recommendations  AACE/ADA: New Consensus Statement on Inpatient Glycemic Control (2015)  Target Ranges:  Prepandial:   less than 140 mg/dL      Peak postprandial:   less than 180 mg/dL (1-2 hours)      Critically ill patients:  140 - 180 mg/dL   Lab Results  Component Value Date   GLUCAP 173 (H) 03/07/2019   HGBA1C 7.6 (H) 03/06/2019    Review of Glycemic Control  Results for [name], [name] (MRN [id number]) as of 03/07/2019 10:21  Ref. Range 03/06/2019 04:45  Hemoglobin A1C Latest Ref Range: 4.8 - 5.6 % 7.6 (H)    Results for Belinda Banks, Belinda Banks (MRN [id number]) as of 03/07/2019 10:21  Ref. Range 03/05/2019 18:20 03/06/2019 11:56 03/06/2019 15:48 03/06/2019 21:15 03/07/2019 07:48  Glucose-Capillary Latest Ref Range: 70 - 99 mg/dL 229 (H)  319 (H) novolog 7units 274 (H) novolog 5units 254 (H) No coverage noted 173 (H) novolog 2units   Diabetes history: None Outpatient Diabetes medications: None Current orders for Inpatient glycemic control: Metformin 500 mg BID + Novolog 0-9 TID with meals  Inpatient Diabetes Program Recommendations:     No history of DM noted.  A1c 7.6%.  CBG's 173-319.   -Please consider meal coverage Novolog 4 units TID with meals if pt. Eats at least 50% of meals  -Novolog HS 0-5  Addendum @ 1500-Spoke with patient at bedside regarding new onset of DM2.  Spoke with pt about new diagnosis. Discussed A1C results with them and explained what an A1C is, basic pathophysiology of DM Type 2, basic home care, basic diabetes diet nutrition principles, importance of checking CBGs and maintaining good CBG control to prevent long-term and short-term complications. Reviewed signs and symptoms of hyperglycemia and hypoglycemia and how to treat hypoglycemia at home. Also reviewed blood sugar goals at home.  Educated on Metformin action and side effects.  Patient does not have a PCP; case management order placed for no PCP.  Patient states they are  trying to find her a PCP.  Educated patient on carbohydrates and amounts that should be eaten with each meal.  Reviewed which foods have high carbohydrates and which foods are better to eat such as green leafy vegetables.  Patient states she drinks several alcoholic beverages per day including wine, mixed drinks.  States she drinks 4-6 of these a day and realizes she needs to cut back.  Educated patient on sugar in alcoholic beverages.  We discussed important of exercise; try to walk 15-30 minutes per day.  She states she is a "couch potato" but will try to get up and move around during commercials.    RNs to provide ongoing basic DM education at bedside with this patient. Have ordered educational booklet, and DM videos. Have also placed RD consult for DM diet education for this patient.   Ordered Living Well with diabetes booklet and out patient diabetes education after DC.  Will need order for meter at DC [id number]  Thank you, Geoffry Paradise, RN, BSN Diabetes Coordinator Inpatient Diabetes Program [phone] (team pager from 8a-5p)

## 2019-03-07 NOTE — Progress Notes (Signed)
STROKE TEAM PROGRESS NOTE   INTERVAL HISTORY Pt lying in bed. No complains. Still has mild right facial droop and right hand clumsy. BP under control but still on cleviprex. Will increase po BP meds.    OBJECTIVE Vitals:   03/07/19 0500 03/07/19 0530 03/07/19 0600 03/07/19 0630  BP: 136/74 130/83 135/83 (!) 137/91  Pulse: 77 71 70 75  Resp: 18 16 14 14   Temp:      TempSrc:      SpO2: 96% 96% 96% 97%  Weight:      Height:        CBC:  Recent Labs  Lab 03/05/19 1826  03/06/19 0445 03/07/19 0427  WBC 11.5*  --  12.3* 9.3  NEUTROABS 8.6*  --   --   --   HGB 18.7*   < > 17.6* 16.3*  HCT RESULTS UNAVAILABLE DUE TO INTERFERING SUBSTANCE   < > 48.4* 44.4  MCV RESULTS UNAVAILABLE DUE TO INTERFERING SUBSTANCE  --  104.3* 99.6  PLT 176  --  206 180   < > = values in this interval not displayed.    Basic Metabolic Panel:  Recent Labs  Lab 03/06/19 0445 03/06/19 1739 03/07/19 0427  NA 136  --  136  K 2.5* 3.6 3.2*  CL 94*  --  100  CO2 26  --  25  GLUCOSE 279*  --  212*  BUN 5*  --  6*  CREATININE 0.64  --  0.74  CALCIUM 8.4*  --  8.5*  MG  --  1.6*  --     Lipid Panel:     Component Value Date/Time   CHOL 191 03/06/2019 0445   TRIG 200 (H) 03/06/2019 0445   HDL 45 03/06/2019 0445   CHOLHDL 4.2 03/06/2019 0445   VLDL 40 03/06/2019 0445   LDLCALC 106 (H) 03/06/2019 0445   HgbA1c:  Lab Results  Component Value Date   HGBA1C 7.6 (H) 03/06/2019   Urine Drug Screen:     Component Value Date/Time   LABOPIA NONE DETECTED 03/06/2019 1242   COCAINSCRNUR NONE DETECTED 03/06/2019 1242   LABBENZ NONE DETECTED 03/06/2019 1242   AMPHETMU NONE DETECTED 03/06/2019 1242   THCU NONE DETECTED 03/06/2019 1242   LABBARB NONE DETECTED 03/06/2019 1242    Alcohol Level No results found for: ETH  IMAGING  Ct Angio Head W Or Wo Contrast Ct Angio Neck W Or Wo Contrast 03/06/2019 IMPRESSION:  1. No aneurysm, dissection or hemodynamically significant stenosis of the major  cervical or intracranial arteries.  2. Unchanged size of hypertensive hemorrhage in left basal ganglia.   Ct Head Code Stroke Wo Contrast 03/05/2019 IMPRESSION:  1. 2.1 x 1.3 x 2.8 cm acute intraparenchymal hemorrhage centered at the left lentiform nucleus, estimated volume 3.8 cc. No significant regional mass effect.  2. Underlying mild age-related cerebral atrophy with chronic small vessel ischemic disease.   Transthoracic Echocardiogram   1. Left ventricular ejection fraction, by visual estimation, is 55 to 60%. The left ventricle has normal function. There is mildly increased left ventricular hypertrophy.  2. Elevated left ventricular end-diastolic pressure.  3. Left ventricular diastolic parameters are consistent with Grade I diastolic dysfunction (impaired relaxation).  4. Global right ventricle has normal systolic function.The right ventricular size is normal. No increase in right ventricular wall thickness.  5. Left atrial size was normal.  6. Right atrial size was normal.  7. The mitral valve is grossly normal. Trace mitral valve regurgitation.  8. The  tricuspid valve is grossly normal. Tricuspid valve regurgitation is trivial.  9. The aortic valve is tricuspid. Aortic valve regurgitation is not visualized. No evidence of aortic valve sclerosis or stenosis. 10. The pulmonic valve was grossly normal. Pulmonic valve regurgitation is not visualized. 11. The inferior vena cava is normal in size with greater than 50% respiratory variability, suggesting right atrial pressure of 3 mmHg.  ECG - ST rate 101 BPM. (See cardiology reading for complete details)  PHYSICAL EXAM  Temp:  [98.2 F (36.8 C)-99.4 F (37.4 C)] 98.4 F (36.9 C) (11/16 0322) Pulse Rate:  [68-96] 75 (11/16 0630) Resp:  [14-25] 14 (11/16 0630) BP: (120-192)/(69-124) 137/91 (11/16 0630) SpO2:  [92 %-100 %] 97 % (11/16 0630)  General - Well nourished, well developed, in no apparent distress.  Ophthalmologic -  fundi not visualized due to noncooperation.  Cardiovascular - Regular rhythm and rate.  Mental Status -  Level of arousal and orientation to time, place, and person were intact. Language including expression, naming, repetition, comprehension was assessed and found intact. Fund of Knowledge was assessed and was intact.  Cranial Nerves II - XII - II - Visual field intact OU. III, IV, VI - Extraocular movements intact. V - Facial sensation intact bilaterally. VII - mild left facial droop. VIII - Hearing & vestibular intact bilaterally. X - Palate elevates symmetrically. XI - Chin turning & shoulder shrug intact bilaterally. XII - Tongue protrusion intact.  Motor Strength - The patient's strength was normal in all extremities except left pronator drift was present and left hand dexterity difficulty.  Bulk was normal and fasciculations were absent.   Motor Tone - Muscle tone was assessed at the neck and appendages and was normal.  Reflexes - The patient's reflexes were symmetrical in all extremities and she had no pathological reflexes.  Sensory - Light touch, temperature/pinprick were assessed and were symmetrical.    Coordination - The patient had normal movements in the hands and feet with no ataxia or dysmetria.  Tremor was absent.  Gait and Station - deferred.   ASSESSMENT/PLAN [name] is a 63 y.o. female with history of HTN (no recent medical f/u) who presented to [facility] ED as a code stroke with c/o slurred speech, right sided weakness, facial droop, BP 230/158 and glucose 238. She did not receive IV t-PA due to [place].  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  Code Stroke CT Head -  Small ICH centered at the left lentiform nucleus, estimated volume 3.8 cc.   CTA H&N -  Unchanged size of hypertensive hemorrhage in left basal ganglia. No aneurysm or AVM2  2D Echo - EF 55-60%. No source of embolus   [employer] Virus 2 - negative  LDL - 106  HgbA1c -  7.6  UDS - neg   VTE prophylaxis -  SCDs  No antithrombotic prior to admission, now on No antithrombotic  Ongoing aggressive stroke risk factor management  Therapy recommendations:  OP PT, OT and SLP  Disposition:  Return home  Hypertensive emergency  Home BP meds: none   Current BP meds: Norvasc ; Cozaar  Back on Cleviprex ; Labetalol prn . SBP goal - 120 - 160 now given stable hematoma . Increase cozaar and put on low dose labetalol . Long-term BP goal normotensive  Alcohol abuse  Pt admitted 4-6 drinks per day  Now on B1/FA/MVI  Put on CIWA protocol   advised to drink no more than 1 alcoholic beverage per day  Hyperlipidemia  Home Lipid lowering medication: none   LDL 106, goal < 70  Current lipid lowering medication: none.  Will consider statin at time of discharge  Diabetes, new diagnosis  Home diabetic meds: none  Current diabetic meds: add metformin 500mg  bid  HgbA1c 7.6, goal < 7.0  hyperglycemia  SSI  CBG nonitoring  DM educator consult placed  Other Stroke Risk Factors  Former cigarette smoker - quit  Other Active Problems  Hypokalemia - 2.5 -> supplemented - 3.2 - supplement   Leukocytosis, resolved  WBC - 11.5->12.3->9.3 (afebrile) (daily labs)  Hypomagnesemia - 1.6 - supplement   Hospital day # 2  This patient is critically ill due to [place], hypertensive emergency, DM, alcohol abuse and at significant risk of neurological worsening, death form hematoma expansion, DKA, DT, alcohol withdraw seizure, encephalopathy. This patient's care requires constant monitoring of vital signs, hemodynamics, respiratory and cardiac monitoring, review of multiple databases, neurological assessment, discussion with family, other specialists and medical decision making of high complexity. I spent 35 minutes of neurocritical care time in the care of this patient.  Rosalin Hawking, MD PhD Stroke Neurology 03/07/2019 8:31 AM   To contact Stroke  Continuity provider, please refer to [email or web address] After hours, contact General Neurology

## 2019-03-07 NOTE — Progress Notes (Signed)
Occupational Therapy Treatment Patient Details Name: WILBUR HA MRN: [id number] DOB: [birth year] Today's Date: 03/07/2019    History of present illness 63 y.o. female  With PMH HTN who presented to [facility] ED as a code stroke with c/o slurred speech and right side weakness. NIHSS 4; SBP >240; CTH showed left basal ganglia hemorrhage so she was not a candidate for TPA. CT angio head no aneurysm, dissection or significant stenosis; PMH HTN    OT comments  Pt progressing well toward stated goals. Focused session on functional mobility and FMC deficits of RUE. Pt able to complete functional mobility at supervision- mod I without LOB noted despite challenge. No visual deficits noted this date. RUE coordination is decreased. Shre reports sensation as intact per testing this date. Issued pt coloring book, theraputty, and clothes pin with instruction for FM exercise to engage in throughout the day. 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 She will highly benefit from OP OT to increase [place] skills prior to RTW. Will continue to follow.   Follow Up Recommendations  Outpatient OT;Supervision - Intermittent    Equipment Recommendations  None recommended by OT    Recommendations for Other Services      Precautions / Restrictions Precautions Precautions: Fall Restrictions Weight Bearing Restrictions: No       Mobility Bed Mobility Overal bed mobility: Needs Assistance Bed Mobility: Supine to Sit     Supine to sit: Modified independent (Device/Increase time)        Transfers Overall transfer level: Needs assistance   Transfers: Sit to/from Stand Sit to Stand: Modified independent (Device/Increase time) Stand pivot transfers: Modified independent (Device/Increase time)            Balance Overall balance assessment: Needs assistance Sitting-balance support: Feet supported Sitting balance-Leahy Scale: Good     Standing balance support: No upper extremity  supported;During functional activity Standing balance-Leahy Scale: Good                             ADL either performed or assessed with clinical judgement   ADL Overall ADL's : Needs assistance/impaired                                     Functional mobility during ADLs: Supervision/safety General ADL Comments: pt able to complete BADL at mod I level this date. Mostly focused session on implications of decreased R [place] in BADL/IADL     Vision Patient Visual Report: No change from baseline Additional Comments: no visual components noted this session   Perception     Praxis      Cognition Arousal/Alertness: Awake/alert Behavior During Therapy: WFL for tasks assessed/performed Overall Cognitive Status: Within Functional Limits for tasks assessed                                          Exercises     Shoulder Instructions       General Comments      Pertinent Vitals/ Pain       Pain Assessment: No/denies pain  Home Living  Prior Functioning/Environment              Frequency  Min 2X/week        Progress Toward Goals  OT Goals(current goals can now be found in the care plan section)     Acute Rehab OT Goals Patient Stated Goal: to get back to normal and to get back to work  OT Goal Formulation: With patient/family Time For Goal Achievement: 03/20/19 Potential to Achieve Goals: Good  Plan Discharge plan remains appropriate    Co-evaluation    PT/OT/SLP Co-Evaluation/Treatment: Yes Reason for Co-Treatment: For patient/therapist safety;To address functional/ADL transfers PT goals addressed during session: Mobility/safety with mobility;Balance OT goals addressed during session: ADL's and self-care      AM-PAC OT "6 Clicks" Daily Activity     Outcome Measure   Help from another person eating meals?: None Help from another person taking care of  personal grooming?: A Little Help from another person toileting, which includes using toliet, bedpan, or urinal?: A Little Help from another person bathing (including washing, rinsing, drying)?: A Little Help from another person to put on and taking off regular upper body clothing?: A Little Help from another person to put on and taking off regular lower body clothing?: A Little 6 Click Score: 19    End of Session    OT Visit Diagnosis: Unsteadiness on feet (R26.81);Muscle weakness (generalized) (M62.81);Hemiplegia and hemiparesis Hemiplegia - Right/Left: Right Hemiplegia - dominant/non-dominant: Dominant 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

## 2019-03-08 DIAGNOSIS — Z7289 Other problems related to lifestyle: Secondary | ICD-10-CM

## 2019-03-08 DIAGNOSIS — I10 Essential (primary) hypertension: Secondary | ICD-10-CM

## 2019-03-08 LAB — BASIC METABOLIC PANEL
Anion gap: 12 (ref 5–15)
BUN: 9 mg/dL (ref 8–23)
CO2: 23 mmol/L (ref 22–32)
Calcium: 8.7 mg/dL — ABNORMAL LOW (ref 8.9–10.3)
Chloride: 101 mmol/L (ref 98–111)
Creatinine, Ser: 0.78 mg/dL (ref 0.44–1.00)
GFR calc Af Amer: >60 mL/min (ref >60)
GFR calc non Af Amer: >60 mL/min (ref >60)
Glucose, Bld: 146 mg/dL — ABNORMAL HIGH (ref 70–99)
Potassium: 4.2 mmol/L (ref 3.5–5.1)
Sodium: 136 mmol/L (ref 135–145)

## 2019-03-08 LAB — CBC
HCT: 44.1 % (ref 36.0–46.0)
Hemoglobin: 16 g/dL — ABNORMAL HIGH (ref 12.0–15.0)
MCH: 37.3 pg — ABNORMAL HIGH (ref 26.0–34.0)
MCHC: 36.3 g/dL — ABNORMAL HIGH (ref 30.0–36.0)
MCV: 102.8 fL — ABNORMAL HIGH (ref 80.0–100.0)
Platelets: 179 10*3/uL (ref 150–400)
RBC: 4.29 MIL/uL (ref 3.87–5.11)
RDW: 12.2 % (ref 11.5–15.5)
WBC: 10.5 10*3/uL (ref 4.0–10.5)
nRBC: 0 % (ref 0.0–0.2)

## 2019-03-08 LAB — GLUCOSE, CAPILLARY
Glucose-Capillary: 137 mg/dL — ABNORMAL HIGH (ref 70–99)
Glucose-Capillary: 208 mg/dL — ABNORMAL HIGH (ref 70–99)
Glucose-Capillary: 164 mg/dL — ABNORMAL HIGH (ref 70–99)
Glucose-Capillary: 149 mg/dL — ABNORMAL HIGH (ref 70–99)

## 2019-03-08 NOTE — Progress Notes (Signed)
Physical Therapy Treatment Patient Details Name: Belinda Banks MRN: [id number] DOB: 08/16/1955 Today's Date: 03/08/2019    History of Present Illness 63 y.o. female  With PMH HTN who presented to [facility] ED as a code stroke with c/o slurred speech and right side weakness. NIHSS 4; SBP >240; CTH showed left basal ganglia hemorrhage so she was not a candidate for TPA. CT angio head no aneurysm, dissection or significant stenosis; PMH HTN     PT Comments    Improved over last treatment with less degradation in the gait pattern after fatigue.  Emphasis on higher level balance, DGI 20/24.   Follow Up Recommendations  No PT follow up(Mobility in OPOT, should be sufficient to weed out deficiencies)     Equipment Recommendations  None recommended by PT    Recommendations for Other Services       Precautions / Restrictions Precautions Precautions: Fall Restrictions Weight Bearing Restrictions: No    Mobility  Bed Mobility               General bed mobility comments: OOB on arrival  Transfers Overall transfer level: Needs assistance Equipment used: None Transfers: Sit to/from Stand Sit to Stand: Modified independent (Device/Increase time) Stand pivot transfers: Modified independent (Device/Increase time)          Ambulation/Gait Ambulation/Gait assistance: Modified independent (Device/Increase time) Gait Distance (Feet): 500 Feet(or more) Assistive device: None(carried her own monitor.) Gait Pattern/deviations: Step-through pattern Gait velocity: ramped up to age appropriate level Gait velocity interpretation: >4.37 ft/sec, indicative of normal walking speed General Gait Details: steady, mild drift R unless cued.  No overt deviation with moderate level balance challenge.   Stairs Stairs: Yes Stairs assistance: Supervision Stair Management: No rails;One rail Right;Alternating pattern;Forwards Number of Stairs: 10 General stair comments: safe with the rail,   ascended without the rail   Wheelchair Mobility    Modified Rankin (Stroke Patients Only) Modified Rankin (Stroke Patients Only) Modified Rankin: Slight disability     Balance Overall balance assessment: Needs assistance Sitting-balance support: Feet supported Sitting balance-Leahy Scale: Good Sitting balance - Comments: static sitting WFL; able to shift weight anteriorly and left to right to adjust her gown and reach feet to floor with no imbalance     Standing balance-Leahy Scale: Good                   Standardized Balance Assessment Standardized Balance Assessment : Dynamic Gait Index   Dynamic Gait Index Level Surface: Normal Change in Gait Speed: Normal Gait with Horizontal Head Turns: Mild Impairment Gait with Vertical Head Turns: Normal Gait and Pivot Turn: Normal Step Over Obstacle: Mild Impairment Step Around Obstacles: Mild Impairment Steps: Mild Impairment Total Score: 20      Cognition Arousal/Alertness: Awake/alert Behavior During Therapy: WFL for tasks assessed/performed Overall Cognitive Status: Within Functional Limits for tasks assessed                                 General Comments: grossly [facility]       Exercises      General Comments General comments (skin integrity, edema, etc.): BP high in the 160's over low 90's, but with map in the 110's otherwise stable      Pertinent Vitals/Pain Pain Assessment: No/denies pain    Home Living                      Prior  Function            PT Goals (current goals can now be found in the care plan section) Acute Rehab PT Goals Patient Stated Goal: to get back to normal and to get back to work  PT Goal Formulation: With patient Time For Goal Achievement: 03/20/19 Potential to Achieve Goals: Good Progress towards PT goals: Progressing toward goals    Frequency    Min 4X/week      PT Plan Current plan remains appropriate    Co-evaluation               AM-PAC PT "6 Clicks" Mobility   Outcome Measure  Help needed turning from your back to your side while in a flat bed without using bedrails?: None Help needed moving from lying on your back to sitting on the side of a flat bed without using bedrails?: None Help needed moving to and from a bed to a chair (including a wheelchair)?: None Help needed standing up from a chair using your arms (e.g., wheelchair or bedside chair)?: None Help needed to walk in hospital room?: None Help needed climbing 3-5 steps with a railing? 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

## 2019-03-08 NOTE — Progress Notes (Signed)
STROKE TEAM PROGRESS NOTE   INTERVAL HISTORY Pt RN at bedside. No complains, no acute event overnight. She is off cleviprex now. On 3 BP meds. Glucose better with insulin and metformin. DM educator on board.    OBJECTIVE Vitals:   03/08/19 0730 03/08/19 0745 03/08/19 0800 03/08/19 0900  BP: 136/73 124/75 (!) 142/75 (!) 103/92  Pulse: 68 67 67 78  Resp: 15 14 14 19   Temp:   98.6 F (37 C)   TempSrc:   Oral   SpO2: 96% 96% 94% 91%  Weight:      Height:        CBC:  Recent Labs  Lab 03/05/19 1826  03/07/19 0427 03/08/19 0538  WBC 11.5*   < > 9.3 10.5  NEUTROABS 8.6*  --   --   --   HGB 18.7*   < > 16.3* 16.0*  HCT RESULTS UNAVAILABLE DUE TO INTERFERING SUBSTANCE   < > 44.4 44.1  MCV RESULTS UNAVAILABLE DUE TO INTERFERING SUBSTANCE   < > 99.6 102.8*  PLT 176   < > 180 179   < > = values in this interval not displayed.    Basic Metabolic Panel:  Recent Labs  Lab 03/06/19 1739 03/07/19 0427 03/08/19 0538  NA  --  136 136  K 3.6 3.2* 4.2  CL  --  100 101  CO2  --  25 23  GLUCOSE  --  212* 146*  BUN  --  6* 9  CREATININE  --  0.74 0.78  CALCIUM  --  8.5* 8.7*  MG 1.6*  --   --     Lipid Panel:     Component Value Date/Time   CHOL 191 03/06/2019 0445   TRIG 200 (H) 03/06/2019 0445   HDL 45 03/06/2019 0445   CHOLHDL 4.2 03/06/2019 0445   VLDL 40 03/06/2019 0445   LDLCALC 106 (H) 03/06/2019 0445   HgbA1c:  Lab Results  Component Value Date   HGBA1C 7.6 (H) 03/06/2019   Urine Drug Screen:     Component Value Date/Time   LABOPIA NONE DETECTED 03/06/2019 1242   COCAINSCRNUR NONE DETECTED 03/06/2019 1242   LABBENZ NONE DETECTED 03/06/2019 1242   AMPHETMU NONE DETECTED 03/06/2019 1242   THCU NONE DETECTED 03/06/2019 1242   LABBARB NONE DETECTED 03/06/2019 1242    Alcohol Level No results found for: ETH  IMAGING  Ct Angio Head W Or Wo Contrast Ct Angio Neck W Or Wo Contrast 03/06/2019 IMPRESSION:  1. No aneurysm, dissection or hemodynamically  significant stenosis of the major cervical or intracranial arteries.  2. Unchanged size of hypertensive hemorrhage in left basal ganglia.   Ct Head Code Stroke Wo Contrast 03/05/2019 IMPRESSION:  1. 2.1 x 1.3 x 2.8 cm acute intraparenchymal hemorrhage centered at the left lentiform nucleus, estimated volume 3.8 cc. No significant regional mass effect.  2. Underlying mild age-related cerebral atrophy with chronic small vessel ischemic disease.   Transthoracic Echocardiogram   1. Left ventricular ejection fraction, by visual estimation, is 55 to 60%. The left ventricle has normal function. There is mildly increased left ventricular hypertrophy.  2. Elevated left ventricular end-diastolic pressure.  3. Left ventricular diastolic parameters are consistent with Grade I diastolic dysfunction (impaired relaxation).  4. Global right ventricle has normal systolic function.The right ventricular size is normal. No increase in right ventricular wall thickness.  5. Left atrial size was normal.  6. Right atrial size was normal.  7. The mitral valve is grossly  normal. Trace mitral valve regurgitation.  8. The tricuspid valve is grossly normal. Tricuspid valve regurgitation is trivial.  9. The aortic valve is tricuspid. Aortic valve regurgitation is not visualized. No evidence of aortic valve sclerosis or stenosis. 10. The pulmonic valve was grossly normal. Pulmonic valve regurgitation is not visualized. 11. The inferior vena cava is normal in size with greater than 50% respiratory variability, suggesting right atrial pressure of 3 mmHg.  ECG - ST rate 101 BPM. (See cardiology reading for complete details)  PHYSICAL EXAM   Temp:  [98.3 F (36.8 C)-99.2 F (37.3 C)] 98.6 F (37 C) (11/17 0800) Pulse Rate:  [67-87] 78 (11/17 0900) Resp:  [12-27] 19 (11/17 0900) BP: (101-160)/(59-106) 103/92 (11/17 0900) SpO2:  [89 %-100 %] 91 % (11/17 0900)  General - Well nourished, well developed, in no apparent  distress.  Ophthalmologic - fundi not visualized due to noncooperation.  Cardiovascular - Regular rhythm and rate.  Mental Status -  Level of arousal and orientation to time, place, and person were intact. Language including expression, naming, repetition, comprehension was assessed and found intact. Fund of Knowledge was assessed and was intact.  Cranial Nerves II - XII - II - Visual field intact OU. III, IV, VI - Extraocular movements intact. V - Facial sensation intact bilaterally. VII - mild left facial droop. VIII - Hearing & vestibular intact bilaterally. X - Palate elevates symmetrically. XI - Chin turning & shoulder shrug intact bilaterally. XII - Tongue protrusion intact.  Motor Strength - The patient's strength was normal in all extremities except left pronator drift was present and left hand dexterity difficulty.  Bulk was normal and fasciculations were absent.   Motor Tone - Muscle tone was assessed at the neck and appendages and was normal.  Reflexes - The patient's reflexes were symmetrical in all extremities and she had no pathological reflexes.  Sensory - Light touch, temperature/pinprick were assessed and were symmetrical.    Coordination - The patient had normal movements in the hands and feet with no ataxia or dysmetria.  Tremor was absent.  Gait and Station - deferred.   ASSESSMENT/PLAN [name] is a 63 y.o. female with history of HTN (no recent medical f/u) who presented to [facility] ED as a code stroke with c/o slurred speech, right sided weakness, facial droop, BP 230/158 and glucose 238. She did not receive IV t-PA due to [place].  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  Code Stroke CT Head -  Small ICH centered at the left lentiform nucleus, estimated volume 3.8 cc.   CTA H&N -  Unchanged size of hypertensive hemorrhage in left basal ganglia. No aneurysm or AVM2  2D Echo - EF 55-60%. No source of embolus   [employer] Virus 2 -  negative  LDL - 106  HgbA1c - 7.6  UDS - neg   VTE prophylaxis -  SCDs  No antithrombotic prior to admission, now on No antithrombotic  Ongoing aggressive stroke risk factor management  Therapy recommendations:  OP PT, OT and SLP  Disposition:  Return home  Transfer to the floor  Hypertensive emergency  Home BP meds: none   Current BP meds: Norvasc ; Cozaar  Off Cleviprex now . SBP goal < 160 now given stable hematoma . Increased cozaar and put on low dose labetalol . Continue home norvasc . Long-term BP goal normotensive  Alcohol abuse  Pt admitted 4-6 drinks per day  Now on B1/FA/MVI  On CIWA protocol  advised to drink no more than 1 alcoholic beverage per day  Hyperlipidemia  Home Lipid lowering medication: none   LDL 106, goal < 70  Current lipid lowering medication: none.  Will consider statin at time of discharge  Diabetes, new diagnosis  Home diabetic meds: none  Current diabetic meds: add metformin 500mg  bid  HgbA1c 7.6, goal < 7.0  hyperglycemia  SSI  CBG nonitoring  DM educator consulted - will need order for meter at DC [id number]  Added 4u novolog w/ meals  Close PCP follow up  Other Stroke Risk Factors  Former cigarette smoker - quit long time ago  Other Active Problems  Hypokalemia, resolved - 2.5 -> supplemented - 3.2 - supplement - 4.2  Leukocytosis, resolved  WBC - 11.5->12.3->9.3->10.5 (afebrile)   Hypomagnesemia - 1.6 - supplemented   Hospital day # 3  This patient is critically ill due to [place], hypertensive emergency, DM, alcohol abuse and at significant risk of neurological worsening, death form hematoma expansion, DKA, DT, alcohol withdraw seizure, encephalopathy. This patient's care requires constant monitoring of vital signs, hemodynamics, respiratory and cardiac monitoring, review of multiple databases, neurological assessment, discussion with family, other specialists and medical decision making of high  complexity. I spent 35 minutes of neurocritical care time in the care of this patient.  Rosalin Hawking, MD PhD Stroke Neurology 03/08/2019 10:18 AM   To contact Stroke Continuity provider, please refer to [email or web address] After hours, contact General Neurology

## 2019-03-09 DIAGNOSIS — I161 Hypertensive emergency: Secondary | ICD-10-CM | POA: Diagnosis present

## 2019-03-09 DIAGNOSIS — E785 Hyperlipidemia, unspecified: Secondary | ICD-10-CM | POA: Diagnosis present

## 2019-03-09 LAB — BASIC METABOLIC PANEL
Anion gap: 12 (ref 5–15)
BUN: 10 mg/dL (ref 8–23)
CO2: 22 mmol/L (ref 22–32)
Calcium: 8.7 mg/dL — ABNORMAL LOW (ref 8.9–10.3)
Chloride: 100 mmol/L (ref 98–111)
Creatinine, Ser: 0.69 mg/dL (ref 0.44–1.00)
GFR calc Af Amer: >60 mL/min (ref >60)
GFR calc non Af Amer: >60 mL/min (ref >60)
Glucose, Bld: 129 mg/dL — ABNORMAL HIGH (ref 70–99)
Potassium: 3.9 mmol/L (ref 3.5–5.1)
Sodium: 134 mmol/L — ABNORMAL LOW (ref 135–145)

## 2019-03-09 LAB — GLUCOSE, CAPILLARY
Glucose-Capillary: 125 mg/dL — ABNORMAL HIGH (ref 70–99)
Glucose-Capillary: 172 mg/dL — ABNORMAL HIGH (ref 70–99)

## 2019-03-09 LAB — CBC
HCT: 42.8 % (ref 36.0–46.0)
Hemoglobin: 15.5 g/dL — ABNORMAL HIGH (ref 12.0–15.0)
MCH: 36.6 pg — ABNORMAL HIGH (ref 26.0–34.0)
MCHC: 36.2 g/dL — ABNORMAL HIGH (ref 30.0–36.0)
MCV: 100.9 fL — ABNORMAL HIGH (ref 80.0–100.0)
Platelets: 168 10*3/uL (ref 150–400)
RBC: 4.24 MIL/uL (ref 3.87–5.11)
RDW: 12 % (ref 11.5–15.5)
WBC: 9.7 10*3/uL (ref 4.0–10.5)
nRBC: 0 % (ref 0.0–0.2)

## 2019-03-09 MED ORDER — LABETALOL HCL 100 MG PO TABS: 100.0000 mg | ORAL_TABLET | Freq: Three times a day (TID) | ORAL | 2 refills | Status: DC

## 2019-03-09 MED ORDER — BLOOD GLUCOSE METER KIT: PACK | 0 refills | Status: DC

## 2019-03-09 MED ORDER — METFORMIN HCL 500 MG PO TABS: 500.0000 mg | ORAL_TABLET | Freq: Two times a day (BID) | ORAL | 2 refills | Status: DC

## 2019-03-09 MED ORDER — LOSARTAN POTASSIUM 50 MG PO TABS: 50.0000 mg | ORAL_TABLET | Freq: Two times a day (BID) | ORAL | 2 refills | Status: DC

## 2019-03-09 MED ORDER — AMLODIPINE BESYLATE 10 MG PO TABS: 10.0000 mg | ORAL_TABLET | Freq: Every day | ORAL | 2 refills | Status: DC

## 2019-03-09 MED ORDER — ATORVASTATIN CALCIUM 20 MG PO TABS: 20.0000 mg | ORAL_TABLET | Freq: Every day | ORAL | 2 refills | Status: DC

## 2019-03-09 NOTE — Progress Notes (Signed)
Occupational Therapy Treatment Patient Details Name: Belinda Banks MRN: [id number] DOB: 03-Jul-1955 Today's Date: 03/09/2019    History of present illness 63 y.o. female  With PMH HTN who presented to [facility] ED as a code stroke with c/o slurred speech and right side weakness. NIHSS 4; SBP >240; CTH showed left basal ganglia hemorrhage so she was not a candidate for TPA. CT angio head no aneurysm, dissection or significant stenosis; PMH HTN    OT comments  Pt progressing well. Set-upA for grooming at sink; Pt manipulating utensils and pencil/pen in hand for signature and coloring books. Pt able to perform theraputty exercises and [facility] exercise with prompting. Handouts provided. Pt modified independence for ADL mobility and minA overall for ADL due to RUE [facility] deficits. Pt would benefit from continued OT acutely and post acutely in OP OT setting. OT following.     Follow Up Recommendations  Outpatient OT;Supervision - Intermittent    Equipment Recommendations  None recommended by OT    Recommendations for Other Services      Precautions / Restrictions Precautions Precautions: Fall Restrictions Weight Bearing Restrictions: No       Mobility Bed Mobility Overal bed mobility: Modified Independent                Transfers Overall transfer level: Needs assistance Equipment used: None Transfers: Sit to/from Stand Sit to Stand: Modified independent (Device/Increase time) Stand pivot transfers: Modified independent (Device/Increase time)            Balance Overall balance assessment: Modified Independent                                         ADL either performed or assessed with clinical judgement   ADL Overall ADL's : Needs assistance/impaired     Grooming: Set up;Sitting Grooming Details (indicate cue type and reason): stabilizing brush in LUE while RUE twists off cap             Lower Body Dressing: Minimal assistance;Sitting/lateral  leans;Sit to/from stand               Functional mobility during ADLs: Supervision/safety General ADL Comments: Set-upA for grooming at sink; Pt manipulating utensils and pencil/pen in hand for signature and coloring books. Pt able to perform theraputty exercises and [facility] exercise with prompting. Handouts provided.     Vision   Vision Assessment?: Yes Eye Alignment: Within Functional Limits Ocular Range of Motion: Within Functional Limits Alignment/Gaze Preference: Within Defined Limits   Perception     Praxis      Cognition Arousal/Alertness: Awake/alert Behavior During Therapy: WFL for tasks assessed/performed Overall Cognitive Status: Within Functional Limits for tasks assessed                                          Exercises Exercises: Other exercises Other Exercises Other Exercises: [facility]  Other Exercises: theraputty Other Exercises: washcloth on table finger flex/ext ROM    Shoulder Instructions       General Comments Pt given handouts for exercises.    Pertinent Vitals/ Pain       Pain Assessment: No/denies pain  Home Living  Prior Functioning/Environment              Frequency  Min 2X/week        Progress Toward Goals  OT Goals(current goals can now be found in the care plan section)  Progress towards OT goals: Progressing toward goals  Acute Rehab OT Goals Patient Stated Goal: to get back to normal and to get back to work  OT Goal Formulation: With patient/family Time For Goal Achievement: 03/20/19 Potential to Achieve Goals: Good ADL Goals Pt Will Perform Grooming: with modified independence;standing Pt Will Perform Upper Body Bathing: with modified independence;sitting Pt Will Perform Lower Body Bathing: with modified independence;sit to/from stand Pt Will Perform Upper Body Dressing: with modified independence;sitting Pt Will Perform Lower Body Dressing:  with modified independence;sit to/from stand Pt Will Transfer to Toilet: with modified independence;ambulating;regular height toilet Pt Will Perform Toileting - Clothing Manipulation and hygiene: with modified independence;sit to/from stand Pt Will Perform Tub/Shower Transfer: Shower transfer;ambulating Pt/caregiver will Perform Home Exercise Program: With written HEP provided;Independently  Plan Discharge plan remains appropriate    Co-evaluation                 AM-PAC OT "6 Clicks" Daily Activity     Outcome Measure   Help from another person eating meals?: None Help from another person taking care of personal grooming?: None Help from another person toileting, which includes using toliet, bedpan, or urinal?: A Little Help from another person bathing (including washing, rinsing, drying)?: A Little Help from another person to put on and taking off regular upper body clothing?: None Help from another person to put on and taking off regular lower body clothing?: A Little 6 Click Score: 21    End of Session    OT Visit Diagnosis: Muscle weakness (generalized) (M62.81) Hemiplegia - Right/Left: Right Hemiplegia - dominant/non-dominant: Dominant 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

## 2019-03-09 NOTE — Plan of Care (Signed)
Went over new onset DM with pt and gave a handout for carb counting.

## 2019-03-09 NOTE — Discharge Summary (Addendum)
Stroke Discharge Summary  Patient ID: Belinda Banks   MRN: [id number]      DOB: 06/10/1955  Date of Admission: 03/05/2019 Date of Discharge: 03/09/2019  Attending Physician:  Rosalin Hawking, MD, Stroke MD Consultant(s):    None  Patient's PCP:  Patient, No Pcp Per (new Dimas Chyle, MD)  DISCHARGE DIAGNOSIS:  Principal Problem:   ICH (intracerebral hemorrhage) ([place]) - Hypertensive L Basal Ganglia ICH Active Problems:   DM (diabetes mellitus) ([place])   HTN (hypertension)   Alcohol use   Hypertensive emergency   Hyperlipidemia   Past Medical History:  Diagnosis Date  . Hypertension    controlled   Past Surgical History:  Procedure Laterality Date  . ABDOMINAL HYSTERECTOMY    . BACK SURGERY      Allergies as of 03/09/2019   No Known Allergies     Medication List    TAKE these medications   amLODipine 10 MG tablet Commonly known as: NORVASC Take 1 tablet (10 mg total) by mouth daily. Start taking on: March 10, 2019   atorvastatin 20 MG tablet Commonly known as: Lipitor Take 1 tablet (20 mg total) by mouth daily.   blood glucose meter kit and supplies Dispense based on patient and insurance preference. Use up to four times daily as directed. (FOR ICD-10 E10.9, E11.9).   DRY EYES OP Place 1 drop into both eyes daily as needed (For dry eyes).   labetalol 100 MG tablet Commonly known as: NORMODYNE Take 1 tablet (100 mg total) by mouth 3 (three) times daily.   losartan 50 MG tablet Commonly known as: COZAAR Take 1 tablet (50 mg total) by mouth 2 (two) times daily.   metFORMIN 500 MG tablet Commonly known as: GLUCOPHAGE Take 1 tablet (500 mg total) by mouth 2 (two) times daily with a meal.       LABORATORY STUDIES CBC    Component Value Date/Time   WBC 9.7 03/09/2019 0428   RBC 4.24 03/09/2019 0428   HGB 15.5 (H) 03/09/2019 0428   HCT 42.8 03/09/2019 0428   PLT 168 03/09/2019 0428   MCV 100.9 (H) 03/09/2019 0428   MCH 36.6 (H) 03/09/2019 0428    MCHC 36.2 (H) 03/09/2019 0428   RDW 12.0 03/09/2019 0428   LYMPHSABS 1.8 03/05/2019 1826   MONOABS 1.0 03/05/2019 1826   EOSABS 0.1 03/05/2019 1826   BASOSABS 0.1 03/05/2019 1826   CMP    Component Value Date/Time   NA 134 (L) 03/09/2019 0428   K 3.9 03/09/2019 0428   CL 100 03/09/2019 0428   CO2 22 03/09/2019 0428   GLUCOSE 129 (H) 03/09/2019 0428   BUN 10 03/09/2019 0428   CREATININE 0.69 03/09/2019 0428   CALCIUM 8.7 (L) 03/09/2019 0428   PROT 8.0 03/05/2019 1826   ALBUMIN 4.0 03/05/2019 1826   AST 86 (H) 03/05/2019 1826   ALT 100 (H) 03/05/2019 1826   ALKPHOS 107 03/05/2019 1826   BILITOT 2.1 (H) 03/05/2019 1826   GFRNONAA >60 03/09/2019 0428   GFRAA >60 03/09/2019 0428   COAGS Lab Results  Component Value Date   INR 1.2 03/05/2019   Lipid Panel    Component Value Date/Time   CHOL 191 03/06/2019 0445   TRIG 200 (H) 03/06/2019 0445   HDL 45 03/06/2019 0445   CHOLHDL 4.2 03/06/2019 0445   VLDL 40 03/06/2019 0445   LDLCALC 106 (H) 03/06/2019 0445   HgbA1C  Lab Results  Component Value Date  HGBA1C 7.6 (H) 03/06/2019   Urinalysis No results found for: COLORURINE, APPEARANCEUR, LABSPEC, PHURINE, GLUCOSEU, HGBUR, BILIRUBINUR, KETONESUR, PROTEINUR, UROBILINOGEN, NITRITE, LEUKOCYTESUR Urine Drug Screen     Component Value Date/Time   LABOPIA NONE DETECTED 03/06/2019 1242   COCAINSCRNUR NONE DETECTED 03/06/2019 1242   LABBENZ NONE DETECTED 03/06/2019 1242   AMPHETMU NONE DETECTED 03/06/2019 Gerald 03/06/2019 1242   LABBARB NONE DETECTED 03/06/2019 1242    Alcohol Level No results found for: [facility]   SIGNIFICANT DIAGNOSTIC STUDIES Ct Angio Head W Or Wo Contrast Ct Angio Neck W Or Wo Contrast 03/06/2019 IMPRESSION:  1. No aneurysm, dissection or hemodynamically significant stenosis of the major cervical or intracranial arteries.  2. Unchanged size of hypertensive hemorrhage in left basal ganglia.   Ct Head Code Stroke Wo  Contrast 03/05/2019 IMPRESSION:  1. 2.1 x 1.3 x 2.8 cm acute intraparenchymal hemorrhage centered at the left lentiform nucleus, estimated volume 3.8 cc. No significant regional mass effect.  2. Underlying mild age-related cerebral atrophy with chronic small vessel ischemic disease.   Transthoracic Echocardiogram  1. Left ventricular ejection fraction, by visual estimation, is 55 to 60%. The left ventricle has normal function. There is mildly increased left ventricular hypertrophy. 2. Elevated left ventricular end-diastolic pressure. 3. Left ventricular diastolic parameters are consistent with Grade I diastolic dysfunction (impaired relaxation). 4. Global right ventricle has normal systolic function.The right ventricular size is normal. No increase in right ventricular wall thickness. 5. Left atrial size was normal. 6. Right atrial size was normal. 7. The mitral valve is grossly normal. Trace mitral valve regurgitation. 8. The tricuspid valve is grossly normal. Tricuspid valve regurgitation is trivial. 9. The aortic valve is tricuspid. Aortic valve regurgitation is not visualized. No evidence of aortic valve sclerosis or stenosis. 10. The pulmonic valve was grossly normal. Pulmonic valve regurgitation is not visualized. 11. The inferior vena cava is normal in size with greater than 50% respiratory variability, suggesting right atrial pressure of 3 mmHg.  ECG - ST rate 101 BPM. (See cardiology reading for complete details)    [name] is a 63 y.o. female with PMH of HTN who presented to [facility] ED as a code stroke with c/o slurred speech and right side weakness. Per patient, she had just returned home from playing with her grandchild at the park. She had a sudden onset of slurred speech. She then went to the bathroom and saw that her face was also drooping. EMS was called. Denies any CP, SOB, vision changes. She did report that her right arm was clumsy.  Symptoms did get somewhat better before arriving to the hospital. Patient has not seen a doctor in about 7 years. She was previously diagnosed with HTN  Had lost weight and thus was removed from hypertensive medications. She has since regained weight. CT shows ICH. BP: 230/158 BG: 238. She was last known well: 03/05/2019 at 1730. Modified Rankin Score=0. NIHSS: 4; slurred speech right arm and leg drift, sensation. Intracerebral Hemorrhage (ICH) Score 0. She was admitted to the neuro ICU for further evaluation and treatment.   HOSPITAL COURSE Ms. BIRDELL FRASIER is a 64 y.o. female with history of HTN (no recent medical f/u) who presented to [facility] ED as a code stroke with c/o slurred speech, right sided weakness, facial droop, BP 230/158 and glucose 238.   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  Code Stroke CT Head -  Small ICH centered at the  left lentiform nucleus, estimated volume 3.8 cc.   CTA H&N -  Unchanged size of hypertensive hemorrhage in left basal ganglia. No aneurysm or AVM2  2D Echo - EF 55-60%. No source of embolus   [employer] Virus 2 - negative  LDL - 106  HgbA1c - 7.6  UDS - neg   No antithrombotic prior to admission, now on No antithrombotic  Therapy recommendations:  OP OT and SLP  Disposition:  Return home  Hypertensive emergency  Home BP meds: none   Current BP meds: Norvasc ; Cozaar  Treated with Cleviprex in ICU for tight BP control  Increased cozaar and put on low dose labetalol  Continue home norvasc  Long-term BP goal normotensive  Alcohol abuse  Pt admitted 4-6 drinks per day  Now on B1/FA/MVI  On CIWA protocol   advised to drink no more than 1 alcoholic beverage per day  Hyperlipidemia  Home Lipid lowering medication: none   LDL 106, goal < 70  Current lipid lowering medication: none.  add lipitor 20 at time of discharge  Diabetes type II, uncontrolled, new diagnosis  Home diabetic meds: none  Current  diabetic meds: added metformin 576m bid  HgbA1c 7.6, goal < 7.0  DM educator consulted - ordered for meter at DC [id number] Added 4u novolog w/ meals in hospital  IP and OP diabetes education arranged by coordinator  Close PCP follow up  Other Stroke Risk Factors  Former cigarette smoker - quit long time ago  Self proclaimed "couch potato" activity encouraged  Other Active Problems  Hypokalemia, resolved - 2.5 -> supplemented - 3.2 - supplement - 4.2 - 3.9   Leukocytosis, resolved  WBC - 11.5->12.3->9.3->10.5->9.7 (afebrile)   Hypomagnesemia - 1.6 - supplemented   DISCHARGE EXAM Blood pressure 124/62, pulse 86, temperature 98.8 F (37.1 C), temperature source Oral, resp. rate 15, height _0  (1.727 m), weight 73 kg, SpO2 97 %. General - Well nourished, well developed, in no apparent distress.  Ophthalmologic - fundi not visualized due to noncooperation.  Cardiovascular - Regular rhythm and rate.  Mental Status -  Level of arousal and orientation to time, place, and person were intact. Language including expression, naming, repetition, comprehension was assessed and found intact. Fund of Knowledge was assessed and was intact.  Cranial Nerves II - XII - II - Visual field intact OU. III, IV, VI - Extraocular movements intact. V - Facial sensation intact bilaterally. VII - mild left facial droop. VIII - Hearing & vestibular intact bilaterally. X - Palate elevates symmetrically. XI - Chin turning & shoulder shrug intact bilaterally. XII - Tongue protrusion intact.  Motor Strength - The patient's strength was normal in all extremities except left pronator drift was present and left hand dexterity difficulty.  Bulk was normal and fasciculations were absent.   Motor Tone - Muscle tone was assessed at the neck and appendages and was normal.  Reflexes - The patient's reflexes were symmetrical in all extremities and she had no pathological reflexes.  Sensory -  Light touch, temperature/pinprick were assessed and were symmetrical.    Coordination - The patient had normal movements in the hands and feet with no ataxia or dysmetria.  Tremor was absent.  Gait and Station - deferred.  Discharge Diet   Heart healthy thin liquids  DISCHARGE PLAN  Disposition:  Return home  Outpatient OT and SLP  Outpatient diabetes education  Glucose monitor and supplies prescribed  Due to hemorrhage and risk of bleeding, do not  take aspirin, aspirin-containing medications, or ibuprofen products Ongoing stroke risk factor control by Primary Care Physician at time of discharge  Follow-up PCP in 2 weeks. CM arranged f/u with Dimas Chyle, MD at [facility]  Follow-up in [facility] in 4 weeks, office to schedule an appointment.   35 minutes were spent preparing discharge.  Rosalin Hawking, MD PhD Stroke Neurology 03/09/2019 3:53 PM

## 2019-03-09 NOTE — Discharge Instructions (Signed)

## 2019-03-09 NOTE — Plan of Care (Signed)
  RD consulted for nutrition education regarding diabetes.  RD working remotely. Lab Results  Component Value Date   HGBA1C 7.6 (H) 03/06/2019   Attempted to contact patient via phone this morning; RD called patient room at 10:35 and unable to reach patient. Per chart review, patient will be discharging home today. Discharge summary reported outpatient diabetes education have been arranged for patient.   RD provided "Heart Healthy Consistent Carbohydrate" handout from the [employer]. Handout attached to discharge instructions.  Handout Discusses different food groups and their effects on blood sugar, emphasizing carbohydrate-containing foods. Provides a list of carbohydrates and recommended serving sizes of common foods.  Discusses importance of controlled and consistent carbohydrate intake throughout the day. Provides examples of ways to balance meals/snacks and encouraged intake of high-fiber, whole grain complex carbohydrates.   Body mass index is 24.47 kg/m. Pt meets criteria for WNL based on current BMI.  Current diet order is HH/CM, patient is consuming approximately 50% of meals at this time. Labs and medications reviewed. No further nutrition interventions warranted at this time. RD contact information provided. If additional nutrition issues arise, please re-consult RD.  Lajuan Lines, [place], [facility] [phone] After Hours/Weekend Pager: [phone]

## 2019-03-09 NOTE — Progress Notes (Signed)
Discharge summary gone over with patient.  RN went over new medications and wrote down times that they are due.  RN went over when to call 911 and symptoms and risk factors of stroke.  Answered all questions.  RN notified pt to call Dr. Phoebe Sharps office to set up an appt and for them to help her with her short term disability.  IV removed from hand.  Husband also at bedside.  Pt discharged at this time.

## 2019-03-09 NOTE — TOC Transition Note (Signed)
Transition of Care [facility]) - CM/SW Discharge Note   Patient Details  Name: Belinda Banks MRN: [id number] Date of Birth: Dec 26, 1955  Transition of [facility]) CM/SW Contact:  Atilano Median, LCSW Phone Number: 03/09/2019, 12:48 PM   Clinical Narrative:    Discharged home. ETOH cessation resources provided to patient. No other needs at this time. Case closed to this CSW.    Final next level of care: Home/Self Care Barriers to Discharge: Barriers Resolved   Patient Goals and CMS Choice Patient states their goals for this hospitalization and ongoing recovery are:: stop drinking gradually and get better      Discharge Placement                       Discharge Plan and Services                                     Social Determinants of Health (SDOH) Interventions     Readmission Risk Interventions No flowsheet data found.

## 2019-03-11 ENCOUNTER — Inpatient Hospital Stay (HOSPITAL_COMMUNITY)
Admission: EM | Admit: 2019-03-11 | Discharge: 2019-03-15 | DRG: 064 | Disposition: A | Discharge status: Home / Self Care | Payer: Managed Care, Other (non HMO) | Attending: Internal Medicine | Admitting: Internal Medicine

## 2019-03-11 ENCOUNTER — Encounter (HOSPITAL_COMMUNITY): Payer: Self-pay

## 2019-03-11 ENCOUNTER — Inpatient Hospital Stay (HOSPITAL_COMMUNITY): Payer: Managed Care, Other (non HMO)

## 2019-03-11 ENCOUNTER — Emergency Department (HOSPITAL_COMMUNITY): Payer: Managed Care, Other (non HMO)

## 2019-03-11 ENCOUNTER — Other Ambulatory Visit: Payer: Self-pay

## 2019-03-11 DIAGNOSIS — R402362 Coma scale, best motor response, obeys commands, at arrival to emergency department: Secondary | ICD-10-CM | POA: Diagnosis present

## 2019-03-11 DIAGNOSIS — E785 Hyperlipidemia, unspecified: Secondary | ICD-10-CM

## 2019-03-11 DIAGNOSIS — I69351 Hemiplegia and hemiparesis following cerebral infarction affecting right dominant side: Secondary | ICD-10-CM | POA: Diagnosis not present

## 2019-03-11 DIAGNOSIS — I6300 Cerebral infarction due to thrombosis of unspecified precerebral artery: Secondary | ICD-10-CM | POA: Diagnosis not present

## 2019-03-11 DIAGNOSIS — R4701 Aphasia: Secondary | ICD-10-CM | POA: Diagnosis present

## 2019-03-11 DIAGNOSIS — R29703 NIHSS score 3: Secondary | ICD-10-CM | POA: Diagnosis present

## 2019-03-11 DIAGNOSIS — R402142 Coma scale, eyes open, spontaneous, at arrival to emergency department: Secondary | ICD-10-CM | POA: Diagnosis present

## 2019-03-11 DIAGNOSIS — R5383 Other fatigue: Secondary | ICD-10-CM

## 2019-03-11 DIAGNOSIS — E1159 Type 2 diabetes mellitus with other circulatory complications: Secondary | ICD-10-CM

## 2019-03-11 DIAGNOSIS — Z87891 Personal history of nicotine dependence: Secondary | ICD-10-CM

## 2019-03-11 DIAGNOSIS — Z7289 Other problems related to lifestyle: Secondary | ICD-10-CM | POA: Diagnosis not present

## 2019-03-11 DIAGNOSIS — I63412 Cerebral infarction due to embolism of left middle cerebral artery: Secondary | ICD-10-CM | POA: Diagnosis present

## 2019-03-11 DIAGNOSIS — F1010 Alcohol abuse, uncomplicated: Secondary | ICD-10-CM | POA: Diagnosis present

## 2019-03-11 DIAGNOSIS — E119 Type 2 diabetes mellitus without complications: Secondary | ICD-10-CM | POA: Diagnosis present

## 2019-03-11 DIAGNOSIS — I639 Cerebral infarction, unspecified: Secondary | ICD-10-CM | POA: Diagnosis not present

## 2019-03-11 DIAGNOSIS — K760 Fatty (change of) liver, not elsewhere classified: Secondary | ICD-10-CM | POA: Diagnosis present

## 2019-03-11 DIAGNOSIS — I619 Nontraumatic intracerebral hemorrhage, unspecified: Secondary | ICD-10-CM | POA: Diagnosis present

## 2019-03-11 DIAGNOSIS — I69392 Facial weakness following cerebral infarction: Secondary | ICD-10-CM | POA: Diagnosis not present

## 2019-03-11 DIAGNOSIS — I10 Essential (primary) hypertension: Secondary | ICD-10-CM

## 2019-03-11 DIAGNOSIS — R402252 Coma scale, best verbal response, oriented, at arrival to emergency department: Secondary | ICD-10-CM | POA: Diagnosis present

## 2019-03-11 DIAGNOSIS — Z885 Allergy status to narcotic agent status: Secondary | ICD-10-CM | POA: Diagnosis not present

## 2019-03-11 DIAGNOSIS — B9689 Other specified bacterial agents as the cause of diseases classified elsewhere: Secondary | ICD-10-CM | POA: Diagnosis present

## 2019-03-11 DIAGNOSIS — I610 Nontraumatic intracerebral hemorrhage in hemisphere, subcortical: Secondary | ICD-10-CM | POA: Diagnosis present

## 2019-03-11 DIAGNOSIS — Z7984 Long term (current) use of oral hypoglycemic drugs: Secondary | ICD-10-CM

## 2019-03-11 DIAGNOSIS — R7881 Bacteremia: Secondary | ICD-10-CM | POA: Diagnosis present

## 2019-03-11 DIAGNOSIS — E876 Hypokalemia: Secondary | ICD-10-CM

## 2019-03-11 DIAGNOSIS — F419 Anxiety disorder, unspecified: Secondary | ICD-10-CM | POA: Diagnosis present

## 2019-03-11 DIAGNOSIS — Z66 Do not resuscitate: Secondary | ICD-10-CM | POA: Diagnosis present

## 2019-03-11 DIAGNOSIS — Z79899 Other long term (current) drug therapy: Secondary | ICD-10-CM | POA: Diagnosis not present

## 2019-03-11 DIAGNOSIS — Z789 Other specified health status: Secondary | ICD-10-CM

## 2019-03-11 LAB — CBC WITH DIFFERENTIAL/PLATELET
Abs Immature Granulocytes: 0.03 10*3/uL (ref 0.00–0.07)
Basophils Absolute: 0 10*3/uL (ref 0.0–0.1)
Basophils Relative: 0 %
Eosinophils Absolute: 0 10*3/uL (ref 0.0–0.5)
Eosinophils Relative: 0 %
HCT: 45.6 % (ref 36.0–46.0)
Hemoglobin: 17 g/dL — ABNORMAL HIGH (ref 12.0–15.0)
Immature Granulocytes: 0 %
Lymphocytes Relative: 6 %
Lymphs Abs: 0.6 10*3/uL — ABNORMAL LOW (ref 0.7–4.0)
MCH: 37 pg — ABNORMAL HIGH (ref 26.0–34.0)
MCHC: 37.3 g/dL — ABNORMAL HIGH (ref 30.0–36.0)
MCV: 99.1 fL (ref 80.0–100.0)
Monocytes Absolute: 0.4 10*3/uL (ref 0.1–1.0)
Monocytes Relative: 4 %
Neutro Abs: 9.5 10*3/uL — ABNORMAL HIGH (ref 1.7–7.7)
Neutrophils Relative %: 90 %
Platelets: 215 10*3/uL (ref 150–400)
RBC: 4.6 MIL/uL (ref 3.87–5.11)
RDW: 11.6 % (ref 11.5–15.5)
WBC: 10.5 10*3/uL (ref 4.0–10.5)
nRBC: 0 % (ref 0.0–0.2)

## 2019-03-11 LAB — COMPREHENSIVE METABOLIC PANEL
ALT: 68 U/L — ABNORMAL HIGH (ref 0–44)
AST: 63 U/L — ABNORMAL HIGH (ref 15–41)
Albumin: 3.6 g/dL (ref 3.5–5.0)
Alkaline Phosphatase: 79 U/L (ref 38–126)
Anion gap: 15 (ref 5–15)
BUN: 13 mg/dL (ref 8–23)
CO2: 21 mmol/L — ABNORMAL LOW (ref 22–32)
Calcium: 9 mg/dL (ref 8.9–10.3)
Chloride: 96 mmol/L — ABNORMAL LOW (ref 98–111)
Creatinine, Ser: 0.59 mg/dL (ref 0.44–1.00)
GFR calc Af Amer: >60 mL/min (ref >60)
GFR calc non Af Amer: >60 mL/min (ref >60)
Glucose, Bld: 170 mg/dL — ABNORMAL HIGH (ref 70–99)
Potassium: 3.5 mmol/L (ref 3.5–5.1)
Sodium: 132 mmol/L — ABNORMAL LOW (ref 135–145)
Total Bilirubin: 3.1 mg/dL — ABNORMAL HIGH (ref 0.3–1.2)
Total Protein: 7.3 g/dL (ref 6.5–8.1)

## 2019-03-11 LAB — URINALYSIS, ROUTINE W REFLEX MICROSCOPIC
Bacteria, UA: NONE SEEN
Bilirubin Urine: NEGATIVE
Glucose, UA: 50 mg/dL — AB
Ketones, ur: 80 mg/dL — AB
Leukocytes,Ua: NEGATIVE
Nitrite: NEGATIVE
Protein, ur: NEGATIVE mg/dL
Specific Gravity, Urine: 1.018 (ref 1.005–1.030)
pH: 6 (ref 5.0–8.0)

## 2019-03-11 LAB — CBG MONITORING, ED: Glucose-Capillary: 132 mg/dL — ABNORMAL HIGH (ref 70–99)

## 2019-03-11 LAB — POC SARS CORONAVIRUS 2 AG -  ED: SARS Coronavirus 2 Ag: NEGATIVE

## 2019-03-11 LAB — LACTIC ACID, PLASMA
Lactic Acid, Venous: 0.9 mmol/L (ref 0.5–1.9)
Lactic Acid, Venous: 1.1 mmol/L (ref 0.5–1.9)

## 2019-03-11 LAB — C-REACTIVE PROTEIN: CRP: 2 mg/dL — ABNORMAL HIGH (ref <1.0)

## 2019-03-11 LAB — AMMONIA: Ammonia: 20 umol/L (ref 9–35)

## 2019-03-11 LAB — ETHANOL: Alcohol, Ethyl (B): <10 mg/dL (ref <10)

## 2019-03-11 MED ORDER — ACETAMINOPHEN 160 MG/5ML PO SOLN: 650.0000 mg | ORAL | Status: DC | PRN

## 2019-03-11 MED ORDER — SODIUM CHLORIDE 0.9 % IV SOLN
2.0000 g | Freq: Three times a day (TID) | INTRAVENOUS | Status: DC
  Administered 2019-03-12 – 2019-03-15 (×11): 2 g via INTRAVENOUS
  Filled 2019-03-11 (×11): qty 2

## 2019-03-11 MED ORDER — LABETALOL HCL 5 MG/ML IV SOLN: 5.0000 mg | INTRAVENOUS | Status: DC | PRN

## 2019-03-11 MED ORDER — INSULIN ASPART 100 UNIT/ML ~~LOC~~ SOLN
0.0000 [IU] | Freq: Three times a day (TID) | SUBCUTANEOUS | Status: DC
  Administered 2019-03-12 – 2019-03-13 (×5): 2 [IU] via SUBCUTANEOUS
  Administered 2019-03-14: 5 [IU] via SUBCUTANEOUS
  Administered 2019-03-14 – 2019-03-15 (×3): 2 [IU] via SUBCUTANEOUS
  Administered 2019-03-15: 5 [IU] via SUBCUTANEOUS

## 2019-03-11 MED ORDER — STROKE: EARLY STAGES OF RECOVERY BOOK
Freq: Once | Status: AC
  Administered 2019-03-12: 02:00:00
  Filled 2019-03-11: qty 1

## 2019-03-11 MED ORDER — LABETALOL HCL 100 MG PO TABS
100.0000 mg | ORAL_TABLET | Freq: Three times a day (TID) | ORAL | Status: DC
  Administered 2019-03-12 – 2019-03-15 (×11): 100 mg via ORAL
  Filled 2019-03-11 (×11): qty 1

## 2019-03-11 MED ORDER — LOSARTAN POTASSIUM 50 MG PO TABS
50.0000 mg | ORAL_TABLET | Freq: Two times a day (BID) | ORAL | Status: DC
  Administered 2019-03-12 – 2019-03-15 (×8): 50 mg via ORAL
  Filled 2019-03-11 (×8): qty 1

## 2019-03-11 MED ORDER — VANCOMYCIN HCL IN DEXTROSE 750-5 MG/150ML-% IV SOLN
750.0000 mg | Freq: Two times a day (BID) | INTRAVENOUS | Status: DC
  Administered 2019-03-12 – 2019-03-15 (×8): 750 mg via INTRAVENOUS
  Filled 2019-03-11 (×9): qty 150

## 2019-03-11 MED ORDER — SODIUM CHLORIDE 0.9 % IV BOLUS
1000.0000 mL | Freq: Once | INTRAVENOUS | Status: AC
  Administered 2019-03-11: 1000 mL via INTRAVENOUS

## 2019-03-11 MED ORDER — SENNOSIDES-DOCUSATE SODIUM 8.6-50 MG PO TABS: 1.0000 | ORAL_TABLET | Freq: Every evening | ORAL | Status: DC | PRN

## 2019-03-11 MED ORDER — LORAZEPAM 2 MG/ML IJ SOLN: 1.0000 mg | INTRAMUSCULAR | Status: AC | PRN

## 2019-03-11 MED ORDER — ATORVASTATIN CALCIUM 10 MG PO TABS
20.0000 mg | ORAL_TABLET | Freq: Every day | ORAL | Status: DC
  Administered 2019-03-11: 20 mg via ORAL
  Filled 2019-03-11: qty 2

## 2019-03-11 MED ORDER — ACETAMINOPHEN 650 MG RE SUPP: 650.0000 mg | RECTAL | Status: DC | PRN

## 2019-03-11 MED ORDER — ACETAMINOPHEN 325 MG PO TABS: 650.0000 mg | ORAL_TABLET | ORAL | Status: DC | PRN

## 2019-03-11 MED ORDER — LORAZEPAM 1 MG PO TABS: 1.0000 mg | ORAL_TABLET | ORAL | Status: AC | PRN

## 2019-03-11 MED ORDER — AMLODIPINE BESYLATE 10 MG PO TABS
10.0000 mg | ORAL_TABLET | Freq: Every day | ORAL | Status: DC
  Administered 2019-03-12 – 2019-03-15 (×5): 10 mg via ORAL
  Filled 2019-03-11 (×5): qty 1

## 2019-03-11 NOTE — ED Notes (Signed)
Patient transported to MRI 

## 2019-03-11 NOTE — ED Provider Notes (Signed)
[facility] EMERGENCY DEPARTMENT Provider Note   CSN: [id number] Arrival date & time: 03/11/19  9242     History   Chief Complaint Chief Complaint  Patient presents with   Weakness    HPI Belinda Banks is a 63 y.o. female.     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  History limited on patient arrival by AMS.  Reports feeling fatigue this AM, mild headache. Reports she believes she had diarrhea last night but is not sure.  Has had right-sided weakness since hemorrhagic stroke, she denies any new weakness, numbness, visual changes.  Denies fever, cough, urinary symptoms.  Husband reports yesterday AM she seemed stressed, was calling insurance companies, but then seemed intermittently confused throughout the day. Has been very sleepy. Last night had nausea, vomiting, headache.  Just not acting like herself.    Past Medical History:  Diagnosis Date   Hypertension    controlled    Patient Active Problem List   Diagnosis Date Noted   CVA (cerebral vascular accident) ([place]) 03/11/2019   Hypertensive emergency 03/09/2019   Hyperlipidemia 03/09/2019   DM (diabetes mellitus) ([place]) 03/07/2019   HTN (hypertension) 03/07/2019   Alcohol use 03/07/2019   ICH (intracerebral hemorrhage) ([place]) - Hypertensive L Basal Ganglia ICH 03/05/2019    Past Surgical History:  Procedure Laterality Date   ABDOMINAL HYSTERECTOMY     BACK SURGERY       OB History   No obstetric history on file.      Home Medications    Prior to Admission medications   Medication Sig Start Date End Date Taking? Authorizing Provider  amLODipine (NORVASC) 10 MG tablet Take 1 tablet (10 mg total) by mouth daily. 03/10/19  Yes Donzetta Starch, NP   Artificial Tear Ointment (DRY EYES OP) Place 1 drop into both eyes daily as needed (dry eyes).    Yes [provider]  atorvastatin (LIPITOR) 20 MG tablet Take 1 tablet (20 mg total) by mouth daily. 03/09/19 03/08/20 Yes Donzetta Starch, NP  labetalol (NORMODYNE) 100 MG tablet Take 1 tablet (100 mg total) by mouth 3 (three) times daily. 03/09/19  Yes Donzetta Starch, NP  losartan (COZAAR) 50 MG tablet Take 1 tablet (50 mg total) by mouth 2 (two) times daily. 03/09/19  Yes Donzetta Starch, NP  metFORMIN (GLUCOPHAGE) 500 MG tablet Take 1 tablet (500 mg total) by mouth 2 (two) times daily with a meal. 03/09/19  Yes Donzetta Starch, NP  blood glucose meter kit and supplies Dispense based on patient and insurance preference. Use up to four times daily as directed. (FOR ICD-10 E10.9, E11.9). 03/09/19   Donzetta Starch, NP    Family History History reviewed. No pertinent family history.  Social History Social History   Tobacco Use   Smoking status: Former Smoker   Smokeless tobacco: Never Used  Substance Use Topics   Alcohol use: Yes    Alcohol/week: 6.0 standard drinks    Types: 3 Glasses of wine, 3 Standard drinks or equivalent per week    Comment: 5-6 drinks a day    Drug use: Never     Allergies   Codeine   Review of Systems Review of Systems  Unable to perform ROS: Mental status change  Constitutional: Negative for fever.  HENT:  Negative for sore throat.   Eyes: Negative for visual disturbance.  Respiratory: Negative for cough and shortness of breath.   Cardiovascular: Negative for chest pain.  Gastrointestinal: Positive for diarrhea, nausea and vomiting. Negative for abdominal pain.  Genitourinary: Negative for difficulty urinating.  Skin: Negative for rash.  Neurological: Positive for facial asymmetry (right facial droop present previously), weakness and headaches. Negative for syncope and speech difficulty.     Physical Exam Updated Vital Signs BP (!) 168/73     Pulse 71    Temp (!) 97.3 F (36.3 C) (Oral)    Resp 16    Ht '5\' 8"'$  (1.727 m)    SpO2 97%    BMI 24.47 kg/m   Physical Exam Vitals signs and nursing note reviewed.  Constitutional:      General: She is not in acute distress.    Appearance: She is well-developed. She is not diaphoretic.  HENT:     Head: Normocephalic and atraumatic.  Eyes:     Conjunctiva/sclera: Conjunctivae normal.  Neck:     Musculoskeletal: Normal range of motion.  Cardiovascular:     Rate and Rhythm: Normal rate and regular rhythm.     Heart sounds: Normal heart sounds. No murmur. No friction rub. No gallop.   Pulmonary:     Effort: Pulmonary effort is normal. No respiratory distress.     Breath sounds: Normal breath sounds. No wheezing or rales.  Abdominal:     General: There is no distension.     Palpations: Abdomen is soft.     Tenderness: There is no abdominal tenderness. There is no guarding.  Musculoskeletal:        General: No tenderness.  Skin:    General: Skin is warm and dry.     Findings: No erythema or rash.  Neurological:     Mental Status: She is alert.     GCS: GCS eye subscore is 4. GCS verbal subscore is 5. GCS motor subscore is 6.     Motor: Weakness present.     Comments: Oriented to self, location Slow to answer questions, sleepy Right sided weakness (unchanged since recent admission)        ED Treatments / Results  Labs (all labs ordered are listed, but only abnormal results are displayed) Labs Reviewed  CBC WITH DIFFERENTIAL/PLATELET - Abnormal; Notable for the following components:      Result Value   Hemoglobin 17.0 (*)    MCH 37.0 (*)    MCHC 37.3 (*)    Neutro Abs 9.5 (*)    Lymphs Abs 0.6 (*)    All other components within normal limits  COMPREHENSIVE METABOLIC PANEL - Abnormal; Notable for the following components:   Sodium 132 (*)    Chloride 96 (*)    CO2 21 (*)    Glucose, Bld 170 (*)    AST 63 (*)    ALT 68 (*)    Total Bilirubin 3.1 (*)    All other  components within normal limits  URINALYSIS, ROUTINE W REFLEX MICROSCOPIC - Abnormal; Notable for the following components:   Glucose, UA 50 (*)    Hgb urine dipstick SMALL (*)    Ketones, ur 80 (*)    All other components within normal limits  URINE CULTURE  LACTIC ACID, PLASMA  LACTIC ACID, PLASMA  ETHANOL  AMMONIA  COMPREHENSIVE METABOLIC PANEL  POC SARS CORONAVIRUS 2 AG -  ED    EKG EKG Interpretation  Date/Time:  Friday March 11 2019 10:43:45 EST Ventricular Rate:  67 PR Interval:    QRS Duration: 93 QT Interval:  451 QTC Calculation: 477 R Axis:   75 Text Interpretation: Sinus rhythm Since prior ECG< rate has slowed Confirmed by Gareth Morgan [phone]) on 03/11/2019 11:02:10 AM   Radiology Ct Head Wo Contrast  Result Date: 03/11/2019 CLINICAL DATA:  Altered mental status. EXAM: CT HEAD WITHOUT CONTRAST TECHNIQUE: Contiguous axial images were obtained from the base of the skull through the vertex without intravenous contrast. COMPARISON:  March 05, 2019. FINDINGS: Brain: 29 x 22 x 12 mm intraparenchymal hemorrhage is noted in the left basal ganglia which is not significantly changed compared to prior exam. Ventricular size is within normal limits. No midline shift is noted. New low density is seen involving the left posterior parietal cortex with some degree of sulcal effacement suggesting acute infarction. Vascular: No hyperdense vessel or unexpected calcification. Skull: Normal. Negative for fracture or focal lesion. Sinuses/Orbits: No acute finding. Other: None. IMPRESSION: Grossly stable intraparenchymal hemorrhage is noted in the left basal ganglia compared to prior exam. New low density is noted involving the left posterior parietal cortex with sulcal effacement concerning for acute infarction. Electronically Signed   By: Marijo Conception M.D.   On: 03/11/2019 11:43   Mr Brain Wo Contrast  Result Date: 03/11/2019 CLINICAL DATA:  Stroke follow-up. Generalized  weakness, nausea, and confusion. Recent left basal ganglia hemorrhage. EXAM: MRI HEAD WITHOUT CONTRAST TECHNIQUE: Multiplanar, multiecho pulse sequences of the brain and surrounding structures were obtained without intravenous contrast. COMPARISON:  Head CT 03/11/2019 FINDINGS: Brain: There is a small to moderate-sized acute to early subacute posterior left MCA infarct involving the parietal lobe and lateral occipital lobe. Additional small acute to early subacute infarcts are noted in the left insula and operculum, right occipital lobe, right parietal lobe, and bilateral frontal lobes. A subacute hemorrhage in the posterior left lentiform nucleus measures 2.0 x 1.4 cm, unchanged from 03/05/2019 and with mild surrounding edema again noted without significant mass effect. Scattered chronic microhemorrhages are noted in both cerebral hemispheres, cerebellum, and pons likely reflecting chronic hypertension. Chronic lacunar infarcts are noted in the right basal ganglia, cerebral white matter, pons, and cerebellum. Additional T2 hyperintensities in the cerebral white matter and pons are nonspecific but compatible with moderate chronic small vessel ischemic disease. There is no midline shift or extra-axial fluid collection. The ventricles and sulci are within normal limits for age. Vascular: Major intracranial vascular flow voids are preserved. Skull and upper cervical spine: Unremarkable bone marrow signal. Sinuses/Orbits: Bilateral cataract extraction. Minimal right maxillary sinus mucosal thickening. Clear mastoid air cells. Other: None. IMPRESSION: 1. Multiple acute to early subacute cerebral infarcts bilaterally, largest in the posterior left MCA region. 2. Subacute hemorrhage in the left lentiform nucleus, unchanged in size from 03/05/2019. Mild edema without significant mass effect. 3. Moderate chronic small vessel ischemic disease with multiple chronic lacunar infarcts. 4. Chronic microhemorrhages likely related  to hypertension. Electronically Signed   By: Logan Bores M.D.   On: 03/11/2019 17:42   Dg Chest Portable 1 View  Result Date: 03/11/2019 CLINICAL DATA:  Weakness, nausea, confusion EXAM: PORTABLE CHEST 1 VIEW COMPARISON:  08/21/2004 FINDINGS: Mild lingular/bilateral lower lobe opacities, likely atelectasis. No pleural effusion or pneumothorax. Cardiomegaly. IMPRESSION: Lingular/bilateral lower lobe atelectasis. Electronically Signed   By: Julian Hy M.D.   On: 03/11/2019 10:40    Procedures Procedures (including critical care time)  Medications Ordered in ED Medications   stroke: mapping our  early stages of recovery book (has no administration in time range)  acetaminophen (TYLENOL) tablet 650 mg (has no administration in time range)    Or  acetaminophen (TYLENOL) 160 MG/5ML solution 650 mg (has no administration in time range)    Or  acetaminophen (TYLENOL) suppository 650 mg (has no administration in time range)  senna-docusate (Senokot-S) tablet 1 tablet (has no administration in time range)  LORazepam (ATIVAN) tablet 1-4 mg (has no administration in time range)    Or  LORazepam (ATIVAN) injection 1-4 mg (has no administration in time range)  atorvastatin (LIPITOR) tablet 20 mg (has no administration in time range)  insulin aspart (novoLOG) injection 0-15 Units (has no administration in time range)  sodium chloride 0.9 % bolus 1,000 mL (1,000 mLs Intravenous New Bag/Given 03/11/19 1606)     Initial Impression / Assessment and Plan / ED Course  I have reviewed the triage vital signs and the nursing notes.  Pertinent labs & imaging results that were available during my care of the patient were reviewed by me and considered in my medical decision making (see chart for details).        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  Labs without significant abnormalities.  UA pending. CT head shows stabple intraparenchymal hemorrhage, new low density involving left parietal cortex.  Discussed with Dr. Cheral Marker who recommends MRI.   Final Clinical Impressions(s) / ED Diagnoses   Final diagnoses:  Cerebrovascular accident (CVA), unspecified mechanism ([place])  Fatigue, unspecified type    ED Discharge Orders    None       Gareth Morgan, MD 03/11/19 2009

## 2019-03-11 NOTE — Progress Notes (Signed)
Pharmacy Antibiotic Note  Belinda Banks is a 63 y.o. female admitted on 03/11/2019 with stroke.  Pharmacy has been consulted for Vancomycin/Cefepime dosing for r/o endocarditis. WBC WNL. Renal function good. Pt with multifocal strokes of different ages>>bacterial endocarditis as possible cause per neurology.   Plan: Vancomycin 750 mg IV q12h >>Estimated AUC: 436 Cefepime 2g IV q8h Trend WBC, temp, renal function  F/U infectious work-up Drug levels as indicated  Height: 5\' 8"  (172.7 cm) IBW/kg (Calculated) : 63.9  Temp (24hrs), Avg:97.3 F (36.3 C), Min:97.3 F (36.3 C), Max:97.3 F (36.3 C)  Recent Labs  Lab 03/06/19 0445 03/07/19 0427 03/08/19 0538 03/09/19 0428 03/11/19 1037 03/11/19 1258  WBC 12.3* 9.3 10.5 9.7 10.5  --   CREATININE 0.64 0.74 0.78 0.69 0.59  --   LATICACIDVEN  --   --   --   --  0.9 1.1    Estimated Creatinine Clearance: 73.6 mL/min (by C-G formula based on SCr of 0.59 mg/dL).    Allergies  Allergen Reactions  . Codeine Other (See Comments)    Made her loopy   [name], PharmD, BCPS Clinical Pharmacist Phone: [phone]

## 2019-03-11 NOTE — ED Notes (Signed)
Patient transported to CT 

## 2019-03-11 NOTE — Consult Note (Addendum)
NEURO HOSPITALIST  CONSULT   Requesting Physician: Dr. Darl Householder    Chief Complaint:   History obtained from:  Patient  / husband  HPI:                                                                                                                                         Belinda Banks is an 63 y.o. female  With [place] ( 02/2019), HTN who presented to [facility] ED with c/o generalized weakness, nausea and slight confusion.  Per husband and chart review, the patient was admitted to ICU last week and was discharged home after diagnosis and management of a left basal ganglia hemorrhage, which was felt most likely to have been to hypertension. No MRI was obtained during prior admission, but CTA was negative for aneurysm or significant stenosis. Patient was not discharged on ASA d/t hemorrhage.   Yesterday (Thursday) after she was back home he noticed that she was tired and sleeping a lot. She had some confusion Thursday afternoon. She c/o HA and nausea. When she woke up today (Friday) she was sitting on the side of the bed and said "she was not feeling right". EMS was called.  Denies CP, SOB, fever, chills or trouble talking.   Here in the ED an MRI was obtained, revealing the recent hemorrhage, an old pontine microhemorrhage, a medium sized subacute left parietal lobe ischemic infarction that appears to be new relative to the CT head performed 5 days ago, and multiple acute small ischemic infarctions in the bilateral cerebral hemispheres in a distribution most consistent with either cardioembolic infarctions, infarctions from septic emboli or vasculitis.   ED course:  BP: 186/97 BG: 170  CTH: Grossly stable intraparenchymal hemorrhage is noted in the left basal ganglia compared to prior exam. New low density is noted involving the left posterior parietal cortex with sulcal effacement concerning for acute infarction.  MRI: multiple acute to early subacute  cerebral infarcts bilaterally, chronic lacunar infarcts  Recent admission 03/05/2019: Presented with slurred speech and right side weakness. Found to have [place].   Modified Rankin: Rankin Score=1 NIHSS: 3   Past Medical History:  Diagnosis Date  . Hypertension    controlled    Past Surgical History:  Procedure Laterality Date  . ABDOMINAL HYSTERECTOMY    . BACK SURGERY      History reviewed. No pertinent family history.      Social History:  reports that she has quit smoking. She has never used smokeless tobacco. She reports current alcohol use of about 6.0 standard drinks of alcohol per week. She reports that she does not use drugs.  Allergies: No Known Allergies  Medications:                                                                                                                           No current facility-administered medications for this encounter.    Current Outpatient Medications  Medication Sig Dispense Refill  . amLODipine (NORVASC) 10 MG tablet Take 1 tablet (10 mg total) by mouth daily. 30 tablet 2  . Artificial Tear Ointment (DRY EYES OP) Place 1 drop into both eyes daily as needed (For dry eyes).    Marland Kitchen atorvastatin (LIPITOR) 20 MG tablet Take 1 tablet (20 mg total) by mouth daily. 30 tablet 2  . blood glucose meter kit and supplies Dispense based on patient and insurance preference. Use up to four times daily as directed. (FOR ICD-10 E10.9, E11.9). 1 each 0  . labetalol (NORMODYNE) 100 MG tablet Take 1 tablet (100 mg total) by mouth 3 (three) times daily. 90 tablet 2  . losartan (COZAAR) 50 MG tablet Take 1 tablet (50 mg total) by mouth 2 (two) times daily. 60 tablet 2  . metFORMIN (GLUCOPHAGE) 500 MG tablet Take 1 tablet (500 mg total) by mouth 2 (two) times daily with a meal. 60 tablet 2     ROS:                                                                                                                                       ROS was performed and is  negative except as noted in HPI    General Examination:                                                                                                      Blood pressure (!) 168/73, pulse 71, temperature (!) 97.3 F (36.3 C), temperature source Oral, resp. rate 16, height _0  (1.727 m), SpO2 97 %.  Physical Exam  Constitutional: Appears ill and in discomfort.  Psych:  Affect appropriate to situation Eyes: Normal external eye and conjunctiva. HENT: Thick mucus orally, decreased hydration of mucosa.    Musculoskeletal-no joint tenderness, deformity or swelling Cardiovascular: Normal rate and regular rhythm.  Respiratory: Effort normal, non-labored breathing saturations WNL GI: Soft.  No distension. There is no tenderness.  Skin: WDI  Neurological Examination Mental Status: Mildly drowsy to decreased alertness in the awake state. Dysthymic affect. Ill-appearing. Oriented to state/name/city. Withdrawn and lets husband answer most questions and provide history.  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 Also with difficulty naming and paraphasias.  Able to follow most simple commands  But having difficulty with comprehension of detailed questions and more complex motor commands.  Cranial Nerves: II Right homonymous hemianopsia. PERRL.  III,IV, VI: ptosis not present, EOMI with saccadic pursuits a prominent component.  V,VII: Right facial droop, cool temp and facial light touch sensation equal bilaterally VIII: hearing intact to voice IX,X: Palate rises midline XI: No gross asymmetry XII: midline tongue extension Motor: RUE 4/5 deltoid otherwise 5/5 LUE 5/5 RLE 5/5  LLE: 5/5 Tone and bulk:normal tone throughout; no atrophy noted Sensory: cool temp and light touch sensation intact throughout, bilaterally Deep Tendon Reflexes: 3+ RUE, 2+ LUE biceps and triceps, 2+ patellar bilaterally Plantars: Right: upgoing   Left:  downgoing Cerebellar: No ataxia noted with HTS or FNF Gait: Deferred   Lab Results: Basic Metabolic Panel: Recent Labs  Lab 03/06/19 0445 03/06/19 1739 03/07/19 0427 03/08/19 0538 03/09/19 0428 03/11/19 1037  NA 136  --  136 136 134* 132*  K 2.5* 3.6 3.2* 4.2 3.9 3.5  CL 94*  --  100 101 100 96*  CO2 26  --  _0 21*  GLUCOSE 279*  --  212* 146* 129* 170*  BUN 5*  --  6* _1 CREATININE 0.64  --  0.74 0.78 0.69 0.59  CALCIUM 8.4*  --  8.5* 8.7* 8.7* 9.0  MG  --  1.6*  --   --   --   --     CBC: Recent Labs  Lab 03/05/19 1826  03/06/19 0445 03/07/19 0427 03/08/19 0538 03/09/19 0428 03/11/19 1037  WBC 11.5*  --  12.3* 9.3 10.5 9.7 10.5  NEUTROABS 8.6*  --   --   --   --   --  9.5*  HGB 18.7*   < > 17.6* 16.3* 16.0* 15.5* 17.0*  HCT RESULTS UNAVAILABLE DUE TO INTERFERING SUBSTANCE   < > 48.4* 44.4 44.1 42.8 45.6  MCV RESULTS UNAVAILABLE DUE TO INTERFERING SUBSTANCE  --  104.3* 99.6 102.8* 100.9* 99.1  PLT 176  --  206 180 179 168 215   < > = values in this interval not displayed.    Lipid Panel: Recent Labs  Lab 03/06/19 0445  CHOL 191  TRIG 200*  HDL 45  CHOLHDL 4.2  VLDL 40  LDLCALC 106*    CBG: Recent Labs  Lab 03/08/19 1132 03/08/19 1713 03/08/19 2110 03/09/19 0730 03/09/19 1121  GLUCAP 208* 164* 149* 125* 172*    Imaging: Ct Head Wo Contrast  Result Date: 03/11/2019 CLINICAL DATA:  Altered mental status. EXAM: CT HEAD WITHOUT CONTRAST TECHNIQUE: Contiguous axial images were obtained from the base of the skull through the vertex without intravenous contrast. COMPARISON:  March 05, 2019. FINDINGS: Brain: 29 x 22 x 12 mm intraparenchymal hemorrhage is noted in the left basal ganglia which is not significantly changed compared to prior exam.  Ventricular size is within normal limits. No midline shift is noted. New low density is seen involving the left posterior parietal cortex with some degree of sulcal effacement suggesting acute  infarction. Vascular: No hyperdense vessel or unexpected calcification. Skull: Normal. Negative for fracture or focal lesion. Sinuses/Orbits: No acute finding. Other: None. IMPRESSION: Grossly stable intraparenchymal hemorrhage is noted in the left basal ganglia compared to prior exam. New low density is noted involving the left posterior parietal cortex with sulcal effacement concerning for acute infarction. Electronically Signed   By: Marijo Conception M.D.   On: 03/11/2019 11:43   Mr Brain Wo Contrast  Result Date: 03/11/2019 CLINICAL DATA:  Stroke follow-up. Generalized weakness, nausea, and confusion. Recent left basal ganglia hemorrhage. EXAM: MRI HEAD WITHOUT CONTRAST TECHNIQUE: Multiplanar, multiecho pulse sequences of the brain and surrounding structures were obtained without intravenous contrast. COMPARISON:  Head CT 03/11/2019 FINDINGS: Brain: There is a small to moderate-sized acute to early subacute posterior left MCA infarct involving the parietal lobe and lateral occipital lobe. Additional small acute to early subacute infarcts are noted in the left insula and operculum, right occipital lobe, right parietal lobe, and bilateral frontal lobes. A subacute hemorrhage in the posterior left lentiform nucleus measures 2.0 x 1.4 cm, unchanged from 03/05/2019 and with mild surrounding edema again noted without significant mass effect. Scattered chronic microhemorrhages are noted in both cerebral hemispheres, cerebellum, and pons likely reflecting chronic hypertension. Chronic lacunar infarcts are noted in the right basal ganglia, cerebral white matter, pons, and cerebellum. Additional T2 hyperintensities in the cerebral white matter and pons are nonspecific but compatible with moderate chronic small vessel ischemic disease. There is no midline shift or extra-axial fluid collection. The ventricles and sulci are within normal limits for age. Vascular: Major intracranial vascular flow voids are preserved.  Skull and upper cervical spine: Unremarkable bone marrow signal. Sinuses/Orbits: Bilateral cataract extraction. Minimal right maxillary sinus mucosal thickening. Clear mastoid air cells. Other: None. IMPRESSION: 1. Multiple acute to early subacute cerebral infarcts bilaterally, largest in the posterior left MCA region. 2. Subacute hemorrhage in the left lentiform nucleus, unchanged in size from 03/05/2019. Mild edema without significant mass effect. 3. Moderate chronic small vessel ischemic disease with multiple chronic lacunar infarcts. 4. Chronic microhemorrhages likely related to hypertension. Electronically Signed   By: Logan Bores M.D.   On: 03/11/2019 17:42   Dg Chest Portable 1 View  Result Date: 03/11/2019 CLINICAL DATA:  Weakness, nausea, confusion EXAM: PORTABLE CHEST 1 VIEW COMPARISON:  08/21/2004 FINDINGS: Mild lingular/bilateral lower lobe opacities, likely atelectasis. No pleural effusion or pneumothorax. Cardiomegaly. IMPRESSION: Lingular/bilateral lower lobe atelectasis. Electronically Signed   By: Julian Hy M.D.   On: 03/11/2019 10:40    Laurey Morale, MSN, NP-C [facility] [phone] 03/11/2019, 7:01 PM    Assessment: 63 y.o. female with recent diagnosis of left basal ganglia ICH (03/05/2019, discharged on 11/18), PMHx of HTN who re-presented to the [facility] ED today with c/o new onset generalized weakness, nausea and confusion. MRI shows multiple acute to early subacute cerebral infarcts bilaterally, chronic lacunar infarcts and the recent hemorrhage.  1. Presentation with multifocal strokes of different sizes and slightly different ages (appear acute to subacute on MRI) in conjunction with recent left basal ganglia hemorrhage raises suspicion for either bacterial endocarditis or small vessel vasculits. If her presentation is due to a vasculitis it is most likely small vessel or a microangiopathy as CTA did not show stenoses of the medium vessels that can be  imaged  with CTA modality (does not have the resolution to image smaller vessels). She did have a mildly elevated white count last admission but that normalized and she is afebrile, so a 3rd component of the DDx would be cardioembolic strokes without underlying endocarditis. Of note, TTE last admission was negative, therefore will need TEE this admission, in addition to blood cultures. 2. To evaluate possible CNS vasculitis, serum vasculitis labs have been ordered including lupus antibody panel, lupus anticoagulants, C3, C4, ESR, C-reactive protein, serum cryoglobulins, hepatitis panel, ANCA titers, Anit-Jo IgG, RPR, HIV PCR and VZV PCR.  3. Stroke Risk Factors - diabetes mellitus, hyperlipidemia and hypertension 4. Discussed with Dr. Estanislado Pandy. He does not feel that a cerebral angiogram would have enough likelihood to yield diagnostically useful information to outweigh risks of the procedure.   Recommendations: -- Maintain SBP in the 120-140 range as patient has a recent left basal ganglia hemorrhage.  -- Discontinue lipitor as some studies suggest that it worsens outcomes in patients with ICH -- PT consult, OT consult, Speech consult -- Telemetry monitoring -- Frequent neuro checks -- Stroke swallow screen  -- Vancomycin and Cefepime for broad endocarditis empiric coverage for now. Blood cultures are being drawn prior to initiation of ABX - have called RN in ED to perform blood draw -- ID consult in the AM.  -- Avoid anticoagulants and antiplatelet medications given recent hemorrhage. DVT prophylaxis with SCDs. -- TEE (ordered) -- Blood cultures x 2 (ordered) -- Serum vasculitis labs (have been ordered, see Assessment above).  -- Frequent neuro checks.  -- If TEE and blood cultures are unrevealing, will likely need LP to assess CSF for markers of possible vasculitis.  -- Repeat MRI brain with contrast (ordered).  -- Stroke team to follow  I have seen and examined the patient. I have  formulated the assessment and recommendations. My exam findings were observed and documented by Laurey Morale, NP. Electronically signed: Dr. Kerney Elbe

## 2019-03-11 NOTE — H&P (Addendum)
History and Physical    Belinda Banks INO:[id number] DOB: [birth year] DOA: 03/11/2019  PCP: Patient, No Pcp Per  Patient coming from: Home  I have personally briefly reviewed patient's old medical records in [place]  Chief Complaint: increase confusion and trouble word-finding  HPI: Belinda Banks is a 63 y.o. female with medical history significant of recent ICH, hypertension, Type 2 diabetes, hyperlipidemia who presents with increasing confusion and aphasia following a recent admission for ICH.  Patient was hospitalized from 11/14-11/18 for hypertensive emergency and hemorrhage in left basal ganglia requiring ICU admission and aggressive blood pressure control with Cleviprex. She was discharged home with amlodipine, increased Cozarr and started on low dose Labetalol. Husband at bedside endorse compliance with medication but starting last night pt started to have increase confusion and trouble word-finding. She was lethargic and stayed in bed all day. Also having vision changes but unable to characterize due to trouble word-finding.  Husband called ICU physician and was told to come into ER.  On arrival, she was hypertensive up to 170/80s with normal HR and on room air. CBC showed no leukocytosis and had hemoglobin of 17.  CMP showed sodium of 132, glucose of 170, creatinine of 0.59, AST mildly elevated 63 and ALT of 68, total bilirubin of 3.1.  Lactic acid of 0.9.  Alcohol level less than 10. CT head shows grossly stable intraparenchymal hemorrhage on the left basal ganglia compared to prior exam. MRI shows multiple acute to early subacute cerebral infarct bilaterally with largest in the posterior left MCA region.  Denies tobacco or illicit drug use. Drinks several cocktails and glasses of wine nightly. No family hx of stroke.   Review of Systems:  Unable to obtain for review systems as per patient has trouble with word finding  Past Medical History:  Diagnosis Date    Hypertension    controlled    Past Surgical History:  Procedure Laterality Date   ABDOMINAL HYSTERECTOMY     BACK SURGERY       reports that she has quit smoking. She has never used smokeless tobacco. She reports current alcohol use of about 6.0 standard drinks of alcohol per week. She reports that she does not use drugs.  No Known Allergies    Prior to Admission medications   Medication Sig Start Date End Date Taking? Authorizing Provider  amLODipine (NORVASC) 10 MG tablet Take 1 tablet (10 mg total) by mouth daily. 03/10/19   Donzetta Starch, NP  Artificial Tear Ointment (DRY EYES OP) Place 1 drop into both eyes daily as needed (For dry eyes).    [provider]  atorvastatin (LIPITOR) 20 MG tablet Take 1 tablet (20 mg total) by mouth daily. 03/09/19 03/08/20  Donzetta Starch, NP  blood glucose meter kit and supplies Dispense based on patient and insurance preference. Use up to four times daily as directed. (FOR ICD-10 E10.9, E11.9). 03/09/19   Donzetta Starch, NP  labetalol (NORMODYNE) 100 MG tablet Take 1 tablet (100 mg total) by mouth 3 (three) times daily. 03/09/19   Donzetta Starch, NP  losartan (COZAAR) 50 MG tablet Take 1 tablet (50 mg total) by mouth 2 (two) times daily. 03/09/19   Donzetta Starch, NP  metFORMIN (GLUCOPHAGE) 500 MG tablet Take 1 tablet (500 mg total) by mouth 2 (two) times daily with a meal. 03/09/19   Donzetta Starch, NP    Physical Exam: Vitals:   03/11/19 1545 03/11/19 1600 03/11/19 1615 03/11/19  1800  BP: (!) 159/95 (!) 159/90 (!) 151/80 (!) 168/73  Pulse:    71  Resp: '13 13 12 16  '$ Temp:      TempSrc:      SpO2:    97%  Height:        Constitutional: non-toxic drowsy lethargic female laying flat in bed Vitals:   03/11/19 1545 03/11/19 1600 03/11/19 1615 03/11/19 1800  BP: (!) 159/95 (!) 159/90 (!) 151/80 (!) 168/73  Pulse:    71  Resp: '13 13 12 16  '$ Temp:      TempSrc:      SpO2:    97%  Height:       Eyes: PERRL, lids and  conjunctivae normal ENMT: Mucous membranes are moist. Posterior pharynx clear of any exudate or lesions. Neck: normal, supple, no masses Respiratory: clear to auscultation bilaterally, no wheezing, no crackles. Normal respiratory effort. No accessory muscle use.  Cardiovascular: Regular rate and rhythm, no murmurs / rubs / gallops. No extremity edema. 2+ pedal pulses. No carotid bruits.  Abdomen: no tenderness, no masses palpated. Bowel sounds positive.  Musculoskeletal: no clubbing / cyanosis. No joint deformity upper and lower extremities. Good ROM, no contractures. Normal muscle tone.  Skin: no rashes, lesions, ulcers. No induration Neurologic: CN 2-12 grossly intact. No nystagmus. Dysarthric speech. Has pass finger pointing with finger to nose. Intact heel to shin.  Sensation intact. Strength 5/5 in all 4.  Psychiatric: Normal judgment and insight. Alert and oriented x 3. Normal mood.     Labs on Admission: I have personally reviewed following labs and imaging studies  CBC: Recent Labs  Lab 03/05/19 1826  03/06/19 0445 03/07/19 0427 03/08/19 0538 03/09/19 0428 03/11/19 1037  WBC 11.5*  --  12.3* 9.3 10.5 9.7 10.5  NEUTROABS 8.6*  --   --   --   --   --  9.5*  HGB 18.7*   < > 17.6* 16.3* 16.0* 15.5* 17.0*  HCT RESULTS UNAVAILABLE DUE TO INTERFERING SUBSTANCE   < > 48.4* 44.4 44.1 42.8 45.6  MCV RESULTS UNAVAILABLE DUE TO INTERFERING SUBSTANCE  --  104.3* 99.6 102.8* 100.9* 99.1  PLT 176  --  206 180 179 168 215   < > = values in this interval not displayed.   Basic Metabolic Panel: Recent Labs  Lab 03/06/19 0445 03/06/19 1739 03/07/19 0427 03/08/19 0538 03/09/19 0428 03/11/19 1037  NA 136  --  136 136 134* 132*  K 2.5* 3.6 3.2* 4.2 3.9 3.5  CL 94*  --  100 101 100 96*  CO2 26  --  '25 23 22 '$ 21*  GLUCOSE 279*  --  212* 146* 129* 170*  BUN 5*  --  6* '9 10 13  '$ CREATININE 0.64  --  0.74 0.78 0.69 0.59  CALCIUM 8.4*  --  8.5* 8.7* 8.7* 9.0  MG  --  1.6*  --   --   --    --    GFR: Estimated Creatinine Clearance: 73.6 mL/min (by C-G formula based on SCr of 0.59 mg/dL). Liver Function Tests: Recent Labs  Lab 03/05/19 1826 03/11/19 1037  AST 86* 63*  ALT 100* 68*  ALKPHOS 107 79  BILITOT 2.1* 3.1*  PROT 8.0 7.3  ALBUMIN 4.0 3.6   No results for input(s): LIPASE, AMYLASE in the last 168 hours. Recent Labs  Lab 03/11/19 1132  AMMONIA 20   Coagulation Profile: Recent Labs  Lab 03/05/19 1826  INR 1.2   Cardiac  Enzymes: No results for input(s): CKTOTAL, CKMB, CKMBINDEX, TROPONINI in the last 168 hours. BNP (last 3 results) No results for input(s): PROBNP in the last 8760 hours. HbA1C: No results for input(s): HGBA1C in the last 72 hours. CBG: Recent Labs  Lab 03/08/19 1132 03/08/19 1713 03/08/19 2110 03/09/19 0730 03/09/19 1121  GLUCAP 208* 164* 149* 125* 172*   Lipid Profile: No results for input(s): CHOL, HDL, LDLCALC, TRIG, CHOLHDL, LDLDIRECT in the last 72 hours. Thyroid Function Tests: No results for input(s): TSH, T4TOTAL, FREET4, T3FREE, THYROIDAB in the last 72 hours. Anemia Panel: No results for input(s): VITAMINB12, FOLATE, FERRITIN, TIBC, IRON, RETICCTPCT in the last 72 hours. Urine analysis:    Component Value Date/Time   COLORURINE YELLOW 03/11/2019 1442   APPEARANCEUR CLEAR 03/11/2019 1442   LABSPEC 1.018 03/11/2019 1442   PHURINE 6.0 03/11/2019 1442   GLUCOSEU 50 (A) 03/11/2019 1442   HGBUR SMALL (A) 03/11/2019 1442   BILIRUBINUR NEGATIVE 03/11/2019 1442   KETONESUR 80 (A) 03/11/2019 1442   PROTEINUR NEGATIVE 03/11/2019 1442   NITRITE NEGATIVE 03/11/2019 1442   LEUKOCYTESUR NEGATIVE 03/11/2019 1442    Radiological Exams on Admission: Ct Head Wo Contrast  Result Date: 03/11/2019 CLINICAL DATA:  Altered mental status. EXAM: CT HEAD WITHOUT CONTRAST TECHNIQUE: Contiguous axial images were obtained from the base of the skull through the vertex without intravenous contrast. COMPARISON:  March 05, 2019.  FINDINGS: Brain: 29 x 22 x 12 mm intraparenchymal hemorrhage is noted in the left basal ganglia which is not significantly changed compared to prior exam. Ventricular size is within normal limits. No midline shift is noted. New low density is seen involving the left posterior parietal cortex with some degree of sulcal effacement suggesting acute infarction. Vascular: No hyperdense vessel or unexpected calcification. Skull: Normal. Negative for fracture or focal lesion. Sinuses/Orbits: No acute finding. Other: None. IMPRESSION: Grossly stable intraparenchymal hemorrhage is noted in the left basal ganglia compared to prior exam. New low density is noted involving the left posterior parietal cortex with sulcal effacement concerning for acute infarction. Electronically Signed   By: Marijo Conception M.D.   On: 03/11/2019 11:43   Mr Brain Wo Contrast  Result Date: 03/11/2019 CLINICAL DATA:  Stroke follow-up. Generalized weakness, nausea, and confusion. Recent left basal ganglia hemorrhage. EXAM: MRI HEAD WITHOUT CONTRAST TECHNIQUE: Multiplanar, multiecho pulse sequences of the brain and surrounding structures were obtained without intravenous contrast. COMPARISON:  Head CT 03/11/2019 FINDINGS: Brain: There is a small to moderate-sized acute to early subacute posterior left MCA infarct involving the parietal lobe and lateral occipital lobe. Additional small acute to early subacute infarcts are noted in the left insula and operculum, right occipital lobe, right parietal lobe, and bilateral frontal lobes. A subacute hemorrhage in the posterior left lentiform nucleus measures 2.0 x 1.4 cm, unchanged from 03/05/2019 and with mild surrounding edema again noted without significant mass effect. Scattered chronic microhemorrhages are noted in both cerebral hemispheres, cerebellum, and pons likely reflecting chronic hypertension. Chronic lacunar infarcts are noted in the right basal ganglia, cerebral white matter, pons, and  cerebellum. Additional T2 hyperintensities in the cerebral white matter and pons are nonspecific but compatible with moderate chronic small vessel ischemic disease. There is no midline shift or extra-axial fluid collection. The ventricles and sulci are within normal limits for age. Vascular: Major intracranial vascular flow voids are preserved. Skull and upper cervical spine: Unremarkable bone marrow signal. Sinuses/Orbits: Bilateral cataract extraction. Minimal right maxillary sinus mucosal thickening. Clear mastoid air cells.  Other: None. IMPRESSION: 1. Multiple acute to early subacute cerebral infarcts bilaterally, largest in the posterior left MCA region. 2. Subacute hemorrhage in the left lentiform nucleus, unchanged in size from 03/05/2019. Mild edema without significant mass effect. 3. Moderate chronic small vessel ischemic disease with multiple chronic lacunar infarcts. 4. Chronic microhemorrhages likely related to hypertension. Electronically Signed   By: Logan Bores M.D.   On: 03/11/2019 17:42   Dg Chest Portable 1 View  Result Date: 03/11/2019 CLINICAL DATA:  Weakness, nausea, confusion EXAM: PORTABLE CHEST 1 VIEW COMPARISON:  08/21/2004 FINDINGS: Mild lingular/bilateral lower lobe opacities, likely atelectasis. No pleural effusion or pneumothorax. Cardiomegaly. IMPRESSION: Lingular/bilateral lower lobe atelectasis. Electronically Signed   By: Julian Hy M.D.   On: 03/11/2019 10:40    EKG: Independently reviewed.   Assessment/Plan  Bilateral infarct largest in the posterior left MCA region in the setting of recent small intracranial hemorrhage -Recent echo showed EF of 55 to 60% with no source of embolus -Recent LDL of 106 -11/15 -Hemoglobin A1c of 7.6 -PT/OT/SLP -Frequent neuro checks.  - No aspirin or statin with recent [place] - Neuro concerned about CNS Vasculitis and have ordered serum labs. Also possibly endocarditis - have TEE planned as she recently had negative TTE  -  Broad spectrum started with vancomycin and cefepime and blood cultures pending. - Repeat MRI pending.    Hypertension - Neurology recommends maintaining BP at 120-140 given recent [place]  - will continue home amlodipine, Labetalol and losartan  Transaminitis/hyperbilirubinemia -Suspect secondary to alcohol use. -Asymptomatic with no abdominal findings - will obtain RUQ ultrasound  Alcohol abuse - admits to several mixed drinks and wine nightly -CIWA protocol   Hyperlipidemia -Hold statin per neuro recs due to potential of worsening ICH  Type 2 diabetes -Hold Metformin -Moderate dose sliding scale  DVT prophylaxis: SCD Code Status: DNR  Family Communication: Plan discussed with patient and husband at bedside  disposition Plan: Home with at least 2 midnight stays  Consults called: Neurology Admission status: inpatient  Radonna Bracher T Harvey Lingo DO [facility]   If 7PM-7AM, please contact night-coverage [email or web address] Password [email or web address]  03/11/2019, 7:28 PM

## 2019-03-11 NOTE — ED Provider Notes (Signed)
  Physical Exam  BP (!) 168/73   Pulse 71   Temp (!) 97.3 F (36.3 C) (Oral)   Resp 16   Ht 5\' 8"  (1.727 m)   SpO2 97%   BMI 24.47 kg/m   Physical Exam  ED Course/Procedures     Procedures  MDM  Care assumed at 4 pm. Patient recently admitted for  and has weakness and confusion since last night. CT showed possible acute stroke in addition to evolving ICH. Sign out pending MRI brain   7:07 PM MRI showed acute on chronic strokes. Dr. Cheral Marker to see patient. Hospitalist to admit.       Drenda Freeze, MD 03/11/19 [place]

## 2019-03-11 NOTE — ED Triage Notes (Signed)
Pt BIB [facility] from home w/ c/o generalized weakness, nausea, and slight confusion. Pt was seen 11/14 for hemorrhagic stroke, right sided droop and decreased grip strength from stroke. Per EMS, pt CBG was 180, received 4 mg Zofran. Pt is A&O x3, confused to date.

## 2019-03-12 LAB — HIV ANTIBODY (ROUTINE TESTING W REFLEX): HIV Screen 4th Generation wRfx: NONREACTIVE

## 2019-03-12 LAB — COMPREHENSIVE METABOLIC PANEL
ALT: 60 U/L — ABNORMAL HIGH (ref 0–44)
AST: 51 U/L — ABNORMAL HIGH (ref 15–41)
Albumin: 3.1 g/dL — ABNORMAL LOW (ref 3.5–5.0)
Alkaline Phosphatase: 72 U/L (ref 38–126)
Anion gap: 12 (ref 5–15)
BUN: 10 mg/dL (ref 8–23)
CO2: 20 mmol/L — ABNORMAL LOW (ref 22–32)
Calcium: 8.5 mg/dL — ABNORMAL LOW (ref 8.9–10.3)
Chloride: 102 mmol/L (ref 98–111)
Creatinine, Ser: 0.65 mg/dL (ref 0.44–1.00)
GFR calc Af Amer: >60 mL/min (ref >60)
GFR calc non Af Amer: >60 mL/min (ref >60)
Glucose, Bld: 164 mg/dL — ABNORMAL HIGH (ref 70–99)
Potassium: 3 mmol/L — ABNORMAL LOW (ref 3.5–5.1)
Sodium: 134 mmol/L — ABNORMAL LOW (ref 135–145)
Total Bilirubin: 2.5 mg/dL — ABNORMAL HIGH (ref 0.3–1.2)
Total Protein: 6.3 g/dL — ABNORMAL LOW (ref 6.5–8.1)

## 2019-03-12 LAB — MAGNESIUM: Magnesium: 1.7 mg/dL (ref 1.7–2.4)

## 2019-03-12 LAB — EXTRACTABLE NUCLEAR ANTIGEN ANTIBODY
ENA SM Ab Ser-aCnc: <0.2 AI (ref 0.0–0.9)
Ribonucleic Protein: <0.2 AI (ref 0.0–0.9)
SSA (Ro) (ENA) Antibody, IgG: <0.2 AI (ref 0.0–0.9)
SSB (La) (ENA) Antibody, IgG: <0.2 AI (ref 0.0–0.9)
Scleroderma (Scl-70) (ENA) Antibody, IgG: <0.2 AI (ref 0.0–0.9)
ds DNA Ab: <1 IU/mL (ref 0–9)

## 2019-03-12 LAB — RAPID HIV SCREEN (HIV 1/2 AB+AG)
HIV 1/2 Antibodies: NONREACTIVE
HIV-1 P24 Antigen - HIV24: NONREACTIVE

## 2019-03-12 LAB — HEPATITIS PANEL, ACUTE
HCV Ab: NONREACTIVE
Hep A IgM: NONREACTIVE
Hep B C IgM: NONREACTIVE
Hepatitis B Surface Ag: NONREACTIVE

## 2019-03-12 LAB — GLUCOSE, CAPILLARY
Glucose-Capillary: 149 mg/dL — ABNORMAL HIGH (ref 70–99)
Glucose-Capillary: 139 mg/dL — ABNORMAL HIGH (ref 70–99)
Glucose-Capillary: 134 mg/dL — ABNORMAL HIGH (ref 70–99)
Glucose-Capillary: 115 mg/dL — ABNORMAL HIGH (ref 70–99)

## 2019-03-12 LAB — SEDIMENTATION RATE: Sed Rate: 12 mm/h (ref 0–22)

## 2019-03-12 LAB — SYPHILIS: RPR W/REFLEX TO RPR TITER AND TREPONEMAL ANTIBODIES, TRADITIONAL SCREENING AND DIAGNOSIS ALGORITHM: RPR Ser Ql: NONREACTIVE

## 2019-03-12 LAB — ANTI-JO 1 ANTIBODY, IGG: Anti JO-1: <0.2 AI (ref 0.0–0.9)

## 2019-03-12 MED ORDER — GADOBUTROL 1 MMOL/ML IV SOLN
7.0000 mL | Freq: Once | INTRAVENOUS | Status: AC | PRN
  Administered 2019-03-11: 7 mL via INTRAVENOUS

## 2019-03-12 MED ORDER — POTASSIUM CHLORIDE CRYS ER 20 MEQ PO TBCR
20.0000 meq | EXTENDED_RELEASE_TABLET | Freq: Three times a day (TID) | ORAL | Status: AC
  Administered 2019-03-12 (×3): 20 meq via ORAL
  Filled 2019-03-12 (×3): qty 1

## 2019-03-12 NOTE — Progress Notes (Signed)
PROGRESS NOTE  Belinda Banks  DOB: 1955-09-08  PCP: Patient, No Pcp Per IOE:[id number]  DOA: 03/11/2019  LOS: 1 day   Chief Complaint  Patient presents with  . Weakness   Brief narrative: Belinda Banks is a 63 y.o. female with medical history significant of recent ICH, hypertension, Type 2 diabetes, hyperlipidemia. Patient presented to the ED on 03/11/2019 with increasing confusion and aphasia following a recent admission for ICH.  Patient was hospitalized from 11/14-11/18 for hypertensive emergency and hemorrhage in left basal ganglia requiring ICU admission and aggressive blood pressure control with Cleviprex.  She has right-sided droop and decreased grip strength due to the stroke.  She was discharged home with amlodipine, increased Cozarr and started on low dose Labetalol.  She was not discharged on aspirin because of hemorrhage.  Reportedly patient was compliant with medication.  On 11/19, patient was lethargic and stayed in bed all day.  In the night, she started to have increasing confusion and trouble word-finding.She also had vision changes and was unable to characterize because of word finding difficulty.  Next morning, symptoms did not improve and hence EMS was called and patient was brought to the ED.  On arrival, she was hypertensive up to 170/80s with normal HR and on room air. CBC showed no leukocytosis and had hemoglobin of 17.  CMP showed sodium of 132, glucose of 170, creatinine of 0.59, AST mildly elevated 63 and ALT of 68, total bilirubin of 3.1.  Lactic acid of 0.9.  Alcohol level less than 10. CT head shows grossly stable intraparenchymal hemorrhage on the left basal ganglia compared to prior exam. MRI shows multiple acute to early subacute cerebral infarct bilaterally with largest in the posterior left MCA region.  Subjective: Patient was seen and examined this morning.  Sitting up at the edge of the bed.  Alert, awake, knows she is in the hospital.  She had a lot  of trouble with memory and word finding difficulty to answer other questions.  Assessment/Plan: Multifocal stroke bilateral acute to subacute cerebral infarcts Chronic lacunar infarcts Recent left basal ganglia area ICH -Presented with generalized weakness, confusion. -CT scan and MRI finding as above. -Neurology consultation appreciated.  Cardioembolic stroke is the likely cause.  However, need to rule out small vessel vasculitis as well as bacterial endocarditis multifocal strokes. -TTE in last admission was negative.  TEE needs to be considered on this admission.  Blood cultures pending as well. -For vasculitis work-up, panel ordered including lupus antibody panel, lupus anticoagulant, C3, C4, ESR, CRP, serum cryoglobulins, hepatitis panel ANCA titers, anti-Jo IgG, RPR, HIV PCR and VZV PCR -In light of recent hemorrhage, will target for systolic blood pressure between 120 and 140. -Lipitor has been stopped because some studies suggest that it worsens outcome in patients with ICH. -PT/OT/ST evaluation. -Continue neurochecks and telemetry monitoring.  Hypertension - Neurology recommends maintaining BP at 120-140 given recent [place]  - will continue home amlodipine, Labetalol and losartan  Transaminitis/hyperbilirubinemia -Suspect secondary to alcohol use. -Asymptomatic with no abdominal findings -RUQ ultrasound showed hepatic steatosis.  Chronic alcohol use -admits to several mixed drinks and wine nightly -CIWA protocol in place. -Counseled to quit alcohol.  Hyperlipidemia -Hold statin per neuro recs due to potential of worsening ICH  Type 2 diabetes -Hold Metformin -Moderate dose sliding scale  DVT prophylaxis: SCD Code Status: DNR  Family Communication: Plan discussed with patient and husband at bedside  disposition Plan: Home with at least 2 midnight stays  Consults called:  Neurology Admission status: inpatient  Body mass index is 24.47 kg/m. Mobility: PT/OT eval  DVT prophylaxis:  Avoid anticoagulant because of recent ICH.  SCDs. Code Status:   Code Status: DNR per H&P Family Communication:  Admitting MD discussed plan with husband last night.  Will update him once more results come in Expected Discharge:  TEE on Monday/Tuesday.  PT eval pending as well.  Consultants:  Neurology, cardiology for TEE  Procedures:    Antimicrobials: Anti-infectives (From admission, onward)   Start     Dose/Rate Route Frequency Ordered Stop   03/11/19 2330  vancomycin (VANCOCIN) IVPB 750 mg/150 ml premix     750 mg 150 mL/hr over 60 Minutes Intravenous Every 12 hours 03/11/19 2316     03/11/19 2330  ceFEPIme (MAXIPIME) 2 g in sodium chloride 0.9 % 100 mL IVPB     2 g 200 mL/hr over 30 Minutes Intravenous Every 8 hours 03/11/19 2316        Diet Order            Diet heart healthy/carb modified Room service appropriate? Yes; Fluid consistency: Thin  Diet effective now              Infusions:  . ceFEPime (MAXIPIME) IV 2 g (03/12/19 6812)  . vancomycin 750 mg (03/12/19 0914)    Scheduled Meds: . amLODipine  10 mg Oral Daily  . insulin aspart  0-15 Units Subcutaneous TID WC  . labetalol  100 mg Oral Q8H  . losartan  50 mg Oral BID  . potassium chloride  20 mEq Oral TID    PRN meds: acetaminophen **OR** acetaminophen (TYLENOL) oral liquid 160 mg/5 mL **OR** acetaminophen, LORazepam **OR** LORazepam, senna-docusate   Objective: Vitals:   03/12/19 0617 03/12/19 0742  BP: 131/69 139/75  Pulse: 65 68  Resp: 20 20  Temp: 98.7 F (37.1 C) 98.7 F (37.1 C)  SpO2: 97% 99%   No intake or output data in the 24 hours ending 03/12/19 0946 There were no vitals filed for this visit. Weight change:  Body mass index is 24.47 kg/m.   Physical Exam: General exam: Appears calm and comfortable.  Skin: No rashes, lesions or ulcers. HEENT: Atraumatic, normocephalic, supple neck, no obvious bleeding Lungs: Clear to auscultation bilaterally CVS: Regular  rate and rhythm, no murmur GI/Abd soft, nontender, nondistended, bowel sound present CNS: Alert, awake, oriented to place.  Has significant memory and word finding difficulty with insight.  Psychiatry: Frustrated with memory deficit Extremities: No pedal edema, no calf tenderness  Data Review: I have personally reviewed the laboratory data and studies available.  Recent Labs  Lab 03/05/19 1826  03/06/19 0445 03/07/19 0427 03/08/19 0538 03/09/19 0428 03/11/19 1037  WBC 11.5*  --  12.3* 9.3 10.5 9.7 10.5  NEUTROABS 8.6*  --   --   --   --   --  9.5*  HGB 18.7*   < > 17.6* 16.3* 16.0* 15.5* 17.0*  HCT RESULTS UNAVAILABLE DUE TO INTERFERING SUBSTANCE   < > 48.4* 44.4 44.1 42.8 45.6  MCV RESULTS UNAVAILABLE DUE TO INTERFERING SUBSTANCE  --  104.3* 99.6 102.8* 100.9* 99.1  PLT 176  --  206 180 179 168 215   < > = values in this interval not displayed.   Recent Labs  Lab 03/06/19 1739 03/07/19 0427 03/08/19 0538 03/09/19 0428 03/11/19 1037 03/12/19 0324  NA  --  136 136 134* 132* 134*  K 3.6 3.2* 4.2 3.9 3.5 3.0*  CL  --  100 101 100 96* 102  CO2  --  _0 21* 20*  GLUCOSE  --  212* 146* 129* 170* 164*  BUN  --  6* _1 CREATININE  --  0.74 0.78 0.69 0.59 0.65  CALCIUM  --  8.5* 8.7* 8.7* 9.0 8.5*  MG 1.6*  --   --   --   --  1.7   Recent Labs  Lab 03/05/19 1826 03/11/19 1037 03/12/19 0324  AST 86* 63* 51*  ALT 100* 68* 60*  ALKPHOS 107 79 72  BILITOT 2.1* 3.1* 2.5*  PROT 8.0 7.3 6.3*  ALBUMIN 4.0 3.6 3.1*    Terrilee Croak, MD  [facility] 03/12/2019

## 2019-03-12 NOTE — Progress Notes (Signed)
PHARMACY - PHYSICIAN COMMUNICATION CRITICAL VALUE ALERT - BLOOD CULTURE IDENTIFICATION (BCID)  Belinda Banks is an 63 y.o. female who presented to [facility] on 03/11/2019 with a chief complaint of increasing confusion and aphasia following a recent admission for ICH. Found to have multifocal stroke bilateral acute to subacute cerebral infarcts, currently ruling out vessel vasculitis as well as bacterial endocarditis.  Assessment:  1/4 bottles, anaerobic, positive with GPC, likely contaminant   Name of physician (or Provider) Contacted: Dr. Pietro Cassis, MD  Current antibiotics: Vancomycin and cefepime   Changes to prescribed antibiotics recommended:  Recommendations accepted by provider  Continue current therapy  No results found for this or any previous visit.  Phillis Haggis 03/12/2019  3:20 PM

## 2019-03-12 NOTE — Progress Notes (Signed)
Occupational Therapy Evaluation Patient Details Name: Belinda Banks MRN: [id number] DOB: 02-Oct-1955 Today's Date: 03/12/2019    History of Present Illness Belinda H McBrayeris a 63 y.o.femalewith medical history significant ofrecent ICH, hypertension, Type 2 diabetes, hyperlipidemia. Patient presented to the ED on 03/11/2019 with increasing confusion and aphasia following a recent admission for [place] 11/14-11/18. MRI shows multiple acute to early subacute cerebral infarct bilaterally with largest in the posterior left MCA region.   Clinical Impression   PTA, pt was recently admitted for L basal ganglia infarct. 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 Pt presenting with decreased activity tolerance, balance, awareness, strength, coordination, R inattention. Pt also demonstrated poor STM, safety, following commands, sequencing, initiation, and problem solving. Pt performed oral care standing at sink with min A, LB dressing and functional transfers with min guard A. Pt would benefit from continued OT acutely to address deficits and increase safe performance of ADLs. Recommend dc with Outpatient OT to address cognitive deficits.     Follow Up Recommendations  Outpatient OT;Supervision/Assistance - 24 hour    Equipment Recommendations  None recommended by OT    Recommendations for Other Services PT consult     Precautions / Restrictions Precautions Precautions: None Restrictions Weight Bearing Restrictions: No      Mobility Bed Mobility Overal bed mobility: Modified Independent Bed Mobility: Supine to Sit           General bed mobility comments: Pt required increased time to come to sitting EOB, required no assist  Transfers Overall transfer level: Needs assistance Equipment used: None Transfers: Sit to/from Stand Sit to Stand: Min guard         General transfer comment: Pt requiring min guard A for  safety    Balance Overall balance assessment: No apparent balance deficits (not formally assessed)                                         ADL either performed or assessed with clinical judgement   ADL Overall ADL's : Needs assistance/impaired Eating/Feeding: Supervision/ safety;Sitting   Grooming: Oral care;Standing;Minimal assistance;Min guard Grooming Details (indicate cue type and reason): Pt required min guard A for balance standing at sink, and min A to open plastic package containing toothbrush.  Pt demonstrated difficulty coordinating RUE to open top of toothpaste, and to place toothpaste on toothbrush. Pt demonstrated R inattention with using cupped hands to rinse out mouth rather than water cup sitting on R side of sink.  Upper Body Bathing: Min guard;Sitting   Lower Body Bathing: Min guard;Sit to/from stand   Upper Body Dressing : Min guard;Sitting   Lower Body Dressing: Min guard;Sit to/from stand Lower Body Dressing Details (indicate cue type and reason): Pt performed figure 4 method for donning socks.  Toilet Transfer: Min guard;Ambulation;Regular Glass blower/designer Details (indicate cue type and reason): Pt required min guard A for safety and balance.  Toileting- Water quality scientist and Hygiene: Min guard;Sit to/from stand       Functional mobility during ADLs: Supervision/safety General ADL Comments: Pt performed oral care at sink  and toileting with min guard A. Pt demonstrated R inattention throughout, requiring increased cues to attend to objects on R side. Pt with decreased coordination of RUE.      Vision Baseline Vision/History: Wears glasses Wears Glasses: Reading only Patient Visual Report:  Blurring of vision;Other (comment)(blurry vision when reading, reports seeing aura in vision) Vision Assessment?: Yes Eye Alignment: Within Functional Limits Ocular Range of Motion: Within Functional Limits Alignment/Gaze Preference: Within  Defined Limits(Slightly to left) Tracking/Visual Pursuits: Decreased smoothness of eye movement to RIGHT inferior field;Requires cues, head turns, or add eye shifts to track Visual Fields: Right visual field deficit(R inattention, pt not reading words on R side of paper) Additional Comments: Pt reports blurry vision with reading, seeing aura, and demonstrates R inattention     Perception     Praxis      Pertinent Vitals/Pain Pain Assessment: No/denies pain     Hand Dominance Right   Extremity/Trunk Assessment Upper Extremity Assessment Upper Extremity Assessment: RUE deficits/detail RUE Deficits / Details: pt with 4/5 strength, and incoordination of RUE during functional fine motor tasks.  RUE Sensation: WNL RUE Coordination: decreased fine motor;decreased gross motor   Lower Extremity Assessment Lower Extremity Assessment: Defer to PT evaluation   Cervical / Trunk Assessment Cervical / Trunk Assessment: Normal   Communication Communication Communication: Expressive difficulties;Other (comment)(difficulty with word finding)   Cognition Arousal/Alertness: Awake/alert Behavior During Therapy: WFL for tasks assessed/performed Overall Cognitive Status: Impaired/Different from baseline Area of Impairment: Memory;Following commands;Safety/judgement;Awareness;Problem solving;Attention                   Current Attention Level: Sustained Memory: Decreased short-term memory Following Commands: Follows one step commands inconsistently;Follows one step commands with increased time Safety/Judgement: Decreased awareness of deficits;Decreased awareness of safety Awareness: Emergent Problem Solving: Slow processing;Decreased initiation;Difficulty sequencing;Requires verbal cues;Requires tactile cues General Comments: Pt required increased time and cues to initiate movement and participation in oral care. Pt with difficulty finding words throughout the session, became frustrated  that she was having difficutly. Pt reported feeling depressed.  When cued to go sit in the recliner, pt walked around the bed toward the recliner and then began to shake the bed rail asking for it to be moved so she could sit. Pt presenting with poor safety and poor STM as seen by asking "so what am I supposed to be doing?" ~ 2 minutes after being told she needed to sit up in the recliner for about an hour. Pt demonstrated R inattention with looking for call bell in the bed on L side, when it was placed on tray in front of pt in recliner.   General Comments  Provided pt with stroke signs and symptoms; pt verbalized understanding. Pt IV in right hand appeared bloody, notified RN.     Exercises     Shoulder Instructions      Home Living Family/patient expects to be discharged to:: Private residence Living Arrangements: Spouse/significant other Available Help at Discharge: Family;Available 24 hours/day Type of Home: House Home Access: Stairs to enter [employer] of Steps: 6 Entrance Stairs-Rails: Right Home Layout: One level     Bathroom Shower/Tub: Occupational psychologist: Handicapped height     Home Equipment: None      Lives With: Spouse    Prior Functioning/Environment Level of Independence: Independent        Comments: Pt reports independence with all BADLs, IADLs, driving. 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        OT Problem List: Decreased strength;Decreased range of motion;Decreased activity tolerance;Impaired balance (sitting and/or standing);Impaired vision/perception;Decreased coordination;Decreased cognition;Decreased safety awareness;Decreased knowledge of use of DME or AE;Decreased knowledge of precautions;Impaired UE functional use      OT Treatment/Interventions: Self-care/ADL training;Neuromuscular education;DME and/or AE  instruction;Therapeutic activities;Visual/perceptual remediation/compensation;Patient/family education;Balance  training    OT Goals(Current goals can be found in the care plan section) Acute Rehab OT Goals Patient Stated Goal: "get back to normal." OT Goal Formulation: With patient/family Time For Goal Achievement: 03/26/19 Potential to Achieve Goals: Good  OT Frequency: Min 3X/week   Barriers to D/C:            Co-evaluation              AM-PAC OT "6 Clicks" Daily Activity     Outcome Measure Help from another person eating meals?: None Help from another person taking care of personal grooming?: A Little Help from another person toileting, which includes using toliet, bedpan, or urinal?: A Little Help from another person bathing (including washing, rinsing, drying)?: A Little Help from another person to put on and taking off regular upper body clothing?: None Help from another person to put on and taking off regular lower body clothing?: A Little 6 Click Score: 20   End of Session Equipment Utilized During Treatment: Gait belt Nurse Communication: Mobility status;Other (comment)(IV in right hand appeared bloody. RN notified)  Activity Tolerance: Patient tolerated treatment well Patient left: in chair;with call bell/phone within reach;with chair alarm set  OT Visit Diagnosis: Muscle weakness (generalized) (M62.81);Low vision, both eyes (H54.2);Other symptoms and signs involving cognitive function;Cognitive communication deficit (R41.841) Symptoms and signs involving cognitive functions: Cerebral infarction                Time: 1430-1455 OT Time Calculation (min): 25 min Charges:  OT General Charges $OT Visit: 1 Visit OT Evaluation $OT Eval Moderate Complexity: 1 Mod OT Treatments $Self Care/Home Management : 8-22 mins  Gus Rankin, OT Student  Gus Rankin 03/12/2019, 3:37 PM

## 2019-03-12 NOTE — Evaluation (Signed)
Speech Language Pathology Evaluation Patient Details Name: Belinda Banks MRN: [id number] DOB: 06-30-1955 Today's Date: 03/12/2019 Time: 1650-1720 SLP Time Calculation (min) (ACUTE ONLY): 30 min  Problem List:  Patient Active Problem List   Diagnosis Date Noted  . CVA (cerebral vascular accident) ([place]) 03/11/2019  . Hypertensive emergency 03/09/2019  . Hyperlipidemia 03/09/2019  . DM (diabetes mellitus) ([place]) 03/07/2019  . HTN (hypertension) 03/07/2019  . Alcohol use 03/07/2019  . ICH (intracerebral hemorrhage) ([place]) - Hypertensive L Basal Ganglia ICH 03/05/2019   Past Medical History:  Past Medical History:  Diagnosis Date  . Hypertension    controlled   Past Surgical History:  Past Surgical History:  Procedure Laterality Date  . ABDOMINAL HYSTERECTOMY    . BACK SURGERY     HPI:  Belinda Banks is a 63 y.o. female with medical history significant of recent ICH, hypertension, Type 2 diabetes, hyperlipidemia. Patient presented to the ED on 03/11/2019 with increasing confusion and aphasia following a recent admission for [place] 11/14-11/18. MRI shows multiple acute to early subacute cerebral infarct bilaterally with largest in the posterior left MCA region.   Assessment / Plan / Recommendation Clinical Impression  Pt was seen for a cognitive-linguistic evaluation in the setting of a CVA.  Pt reported that she lives with her husband and that she was working full time prior to admission.  She stated that she is responsible for her IADLs (medications, finances, etc.).  She presents with expressive aphasia, c/b anomia and difficulty with confrontational naming.  Anomia was most prominent during conversational speech.  She benefited from extra time and cues to describe the object that she was thinking of.  She additionally presents with cognitive deficits in short-term memory and problem solving, likely impacted by language deficits.  Pt required extra processing time for receptive language  tasks; however, she completed them all with 100% accuracy independently.  No dysarthria was observed and speech was >95% intelligible to an unfamiliar listener.  Recommend additional ST targeting cognitive-linguistic deficits at time of discharge.   SLP will continue to f/u per POC.     SLP Assessment  SLP Recommendation/Assessment: Patient needs continued Speech Lanaguage Pathology Services SLP Visit Diagnosis: Cognitive communication deficit (R41.841);Aphasia (R47.01)    Follow Up Recommendations  Inpatient Rehab;Outpatient SLP    Frequency and Duration min 2x/week  2 weeks      SLP Evaluation Cognition  Overall Cognitive Status: Impaired/Different from baseline Arousal/Alertness: Awake/alert Orientation Level: Oriented X4 Attention: Sustained Immediate Memory Recall: Sock;Blue;Bed Memory Recall Sock: Not able to recall Memory Recall Blue: Not able to recall Memory Recall Bed: Not able to recall Awareness: Appears intact Problem Solving: Impaired Problem Solving Impairment: Verbal complex Executive Function: Reasoning Reasoning: Appears intact Safety/Judgment: Appears intact       Comprehension  Auditory Comprehension Yes/No Questions: Within Functional Limits Commands: Within Functional Limits Conversation: Complex Interfering Components: Processing speed EffectiveTechniques: Slowed speech;Stressing words;Repetition;Extra processing time Reading Comprehension Reading Status: Impaired Word level: Within functional limits Sentence Level: Impaired    Expression Expression Primary Mode of Expression: Verbal Verbal Expression Overall Verbal Expression: Impaired Level of Generative/Spontaneous Verbalization: Conversation Repetition: No impairment Naming: Impairment Responsive: 76-100% accurate Confrontation: Impaired Convergent: Not tested Divergent: Not tested Written Expression Dominant Hand: Right   Oral / Motor  Oral Motor/Sensory Function Overall Oral  Motor/Sensory Function: Mild impairment Facial ROM: Reduced right Facial Symmetry: Abnormal symmetry right Facial Strength: Within Functional Limits Facial Sensation: Within Functional Limits Lingual ROM: Within Functional Limits Lingual Symmetry: Within Functional  Limits Lingual Strength: Within Functional Limits Lingual Sensation: Within Functional Limits Motor Speech Overall Motor Speech: Appears within functional limits for tasks assessed                      Colin Mulders M.S., [place] Office: [phone]  [place] 03/12/2019, 6:02 PM

## 2019-03-12 NOTE — ED Notes (Signed)
Patient transported to MRI 

## 2019-03-12 NOTE — Evaluation (Signed)
Physical Therapy Evaluation Patient Details Name: Belinda Banks MRN: [id number] DOB: October 25, 1955 Today's Date: 03/12/2019   History of Present Illness  Belinda H McBrayeris a 63 y.o.femalewith medical history significant ofrecent ICH, hypertension, Type 2 diabetes, hyperlipidemia. Patient presented to the ED on 03/11/2019 with increasing confusion and aphasia following a recent admission for [place] 11/14-11/18. MRI shows multiple acute to early subacute cerebral infarct bilaterally with largest in the posterior left MCA region.  Clinical Impression  Pt admitted with above. Presents with decreased cognition and communication impairments, with difficulties noted in short term memory and word finding. From a mobility perspective, pt ambulating hallway distances without an assistive device independently. Negotiated 5 steps without railings at a supervision level. Scoring 21/24 on the Dynamic Gait Index, indicating she is not at risk for falls, however, has some dynamic balance impairments. Will continue to follow acutely. Do not anticipate need for PT follow up; please defer to occupational/speech therapy recommendations.     Follow Up Recommendations No PT follow up;Supervision/Assistance - 24 hour    Equipment Recommendations  None recommended by PT    Recommendations for Other Services       Precautions / Restrictions Precautions Precautions: None Restrictions Weight Bearing Restrictions: No      Mobility  Bed Mobility Overal bed mobility: Independent                Transfers Overall transfer level: Independent Equipment used: None                Ambulation/Gait Ambulation/Gait assistance: Independent Gait Distance (Feet): 300 Feet Assistive device: None Gait Pattern/deviations: WFL(Within Functional Limits)     General Gait Details: No apparent gait deficits or overt LOB  Stairs Stairs: Yes Stairs assistance: Supervision Stair Management: No rails Number of  Stairs: 5 General stair comments: step by step pattern, increased time  Wheelchair Mobility    Modified Rankin (Stroke Patients Only) Modified Rankin (Stroke Patients Only) Pre-Morbid Rankin Score: No symptoms Modified Rankin: Moderate disability     Balance Overall balance assessment: Independent                               Standardized Balance Assessment Standardized Balance Assessment : Dynamic Gait Index   Dynamic Gait Index Level Surface: Normal Change in Gait Speed: Normal Gait with Horizontal Head Turns: Normal Gait with Vertical Head Turns: Normal Gait and Pivot Turn: Mild Impairment Step Over Obstacle: Mild Impairment Step Around Obstacles: Normal Steps: Mild Impairment Total Score: 21       Pertinent Vitals/Pain Pain Assessment: No/denies pain    Home Living Family/patient expects to be discharged to:: Private residence Living Arrangements: Spouse/significant other Available Help at Discharge: Family;Available 24 hours/day Type of Home: House Home Access: Stairs to enter Entrance Stairs-Rails: Right Entrance Stairs-Number of Steps: 6 Home Layout: One level Home Equipment: None      Prior Function Level of Independence: Independent         Comments: 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 Dominance   Dominant Hand: Right    Extremity/Trunk Assessment   Upper Extremity Assessment Upper Extremity Assessment: Defer to OT evaluation    Lower Extremity Assessment Lower Extremity Assessment: Overall WFL for tasks assessed    Cervical / Trunk Assessment Cervical / Trunk Assessment: Normal  Communication   Communication: No difficulties  Cognition Arousal/Alertness: Awake/alert Behavior During Therapy: WFL for tasks assessed/performed Overall Cognitive Status: Impaired/Different  from baseline Area of Impairment: Memory;Awareness;Orientation                 Orientation Level: Disoriented to;Situation   Memory:  Decreased short-term memory     Awareness: Emergent   General Comments: Pt A&Ox3, oriented to self, place, and time; not oriented to day of week, stating it was "Sunday or Monday." Oriented to [facility], however, at a later point in session, stating, "Am I at [employer]?" Pt acknowledging difficulties with short term memory, often getting frustrated, indicative of some emerging awareness. Did not recall she had a stroke. Asking appropriate questions regarding recovery times.      General Comments      Exercises     Assessment/Plan    PT Assessment Patient needs continued PT services  PT Problem List Decreased strength;Decreased mobility;Decreased balance       PT Treatment Interventions Gait training;Stair training;Functional mobility training;Therapeutic activities;Therapeutic exercise;Balance training;Neuromuscular re-education;Patient/family education    PT Goals (Current goals can be found in the Care Plan section)  Acute Rehab PT Goals Patient Stated Goal: "get back to normal." PT Goal Formulation: With patient Time For Goal Achievement: 03/26/19 Potential to Achieve Goals: Good    Frequency Min 4X/week   Barriers to discharge        Co-evaluation               AM-PAC PT "6 Clicks" Mobility  Outcome Measure Help needed turning from your back to your side while in a flat bed without using bedrails?: None Help needed moving from lying on your back to sitting on the side of a flat bed without using bedrails?: None Help needed moving to and from a bed to a chair (including a wheelchair)?: None Help needed standing up from a chair using your arms (e.g., wheelchair or bedside chair)?: None Help needed to walk in hospital room?: None Help needed climbing 3-5 steps with a railing? : None 6 Click Score: 24    End of Session   Activity Tolerance: Patient tolerated treatment well Patient left: in bed;with call bell/phone within reach;with bed alarm set Nurse  Communication: Mobility status PT Visit Diagnosis: Other symptoms and signs involving the nervous system (R29.898);Unsteadiness on feet (R26.81)    Time: [id number] PT Time Calculation (min) (ACUTE ONLY): 26 min   Charges:   PT Evaluation $PT Eval Moderate Complexity: 1 Mod PT Treatments $Therapeutic Activity: 8-22 mins        Ellamae Sia, PT, DPT Acute Rehabilitation Services Pager [phone] Office [phone]   Willy Eddy 03/12/2019, 10:30 AM

## 2019-03-12 NOTE — Progress Notes (Signed)
STROKE TEAM PROGRESS NOTE   HISTORY OF PRESENT ILLNESS (per record) Belinda Banks is an 63 y.o. female  With [place] ICH ( 02/2019), HTN who presented to [facility] ED with c/o generalized weakness, nausea and slight confusion.  Per husband and chart review, the patient was admitted to ICU last week and was discharged home after diagnosis and management of a left basal ganglia hemorrhage, which was felt most likely to have been to hypertension. No MRI was obtained during prior admission, but CTA was negative for aneurysm or significant stenosis. Patient was not discharged on ASA d/t hemorrhage.   Yesterday (Thursday) after she was back home he noticed that she was tired and sleeping a lot. She had some confusion Thursday afternoon. She c/o HA and nausea. When she woke up today (Friday) she was sitting on the side of the bed and said "she was not feeling right". EMS was called.  Denies CP, SOB, fever, chills or trouble talking.   Here in the ED an MRI was obtained, revealing the recent hemorrhage, an old pontine microhemorrhage, a medium sized subacute left parietal lobe ischemic infarction that appears to be new relative to the CT head performed 5 days ago, and multiple acute small ischemic infarctions in the bilateral cerebral hemispheres in a distribution most consistent with either cardioembolic infarctions, infarctions from septic emboli or vasculitis.   ED course:  BP: 186/97 BG: 170  CTH: Grossly stable intraparenchymal hemorrhage is noted in the left basal ganglia compared to prior exam. New low density is noted involving the left posterior parietal cortex with sulcal effacement concerning for acute infarction.  MRI: multiple acute to early subacute cerebral infarcts bilaterally, chronic lacunar infarcts  Recent admission 03/05/2019: Presented with slurred speech and right side weakness. Found to have [place].   Modified Rankin: Rankin Score=1 NIHSS: 3   INTERVAL HISTORY  (subjective)  Patient is currently stable, still has some receptive aphasia but is able to communicate most things without difficulty. States she had a good night and is just concerned about her recovery. No events overnight    OBJECTIVE Vitals:   03/11/19 2055 03/12/19 0101 03/12/19 0102 03/12/19 0137  BP: (!) 179/99 (!) 159/80 (!) 159/80 (!) 173/87  Pulse: 72 66 65 72  Resp: '16 14 16 20  '$ Temp:    98.9 F (37.2 C)  TempSrc:    Oral  SpO2: 99% 94% 96% 98%  Height:        CBC:  Recent Labs  Lab 03/05/19 1826  03/09/19 0428 03/11/19 1037  WBC 11.5*   < > 9.7 10.5  NEUTROABS 8.6*  --   --  9.5*  HGB 18.7*   < > 15.5* 17.0*  HCT RESULTS UNAVAILABLE DUE TO INTERFERING SUBSTANCE   < > 42.8 45.6  MCV RESULTS UNAVAILABLE DUE TO INTERFERING SUBSTANCE   < > 100.9* 99.1  PLT 176   < > 168 215   < > = values in this interval not displayed.    Basic Metabolic Panel:  Recent Labs  Lab 03/06/19 1739  03/11/19 1037 03/12/19 0324  NA  --    < > 132* 134*  K 3.6   < > 3.5 3.0*  CL  --    < > 96* 102  CO2  --    < > 21* 20*  GLUCOSE  --    < > 170* 164*  BUN  --    < > 13 10  CREATININE  --    < >  0.59 0.65  CALCIUM  --    < > 9.0 8.5*  MG 1.6*  --   --   --    < > = values in this interval not displayed.    Lipid Panel:     Component Value Date/Time   CHOL 191 03/06/2019 0445   TRIG 200 (H) 03/06/2019 0445   HDL 45 03/06/2019 0445   CHOLHDL 4.2 03/06/2019 0445   VLDL 40 03/06/2019 0445   LDLCALC 106 (H) 03/06/2019 0445   HgbA1c:  Lab Results  Component Value Date   HGBA1C 7.6 (H) 03/06/2019   Urine Drug Screen:     Component Value Date/Time   LABOPIA NONE DETECTED 03/06/2019 1242   COCAINSCRNUR NONE DETECTED 03/06/2019 1242   LABBENZ NONE DETECTED 03/06/2019 1242   AMPHETMU NONE DETECTED 03/06/2019 1242   THCU NONE DETECTED 03/06/2019 1242   LABBARB NONE DETECTED 03/06/2019 1242    Alcohol Level     Component Value Date/Time   ETH <10 03/11/2019 1132     IMAGING  Ct Head Wo Contrast 03/11/2019 IMPRESSION:  Grossly stable intraparenchymal hemorrhage is noted in the left basal ganglia compared to prior exam. New low density is noted involving the left posterior parietal cortex with sulcal effacement concerning for acute infarction.   Mr Brain Wo Contrast 03/11/2019 IMPRESSION:  1. Multiple acute to early subacute cerebral infarcts bilaterally, largest in the posterior left MCA region.  2. Subacute hemorrhage in the left lentiform nucleus, unchanged in size from 03/05/2019. Mild edema without significant mass effect.  3. Moderate chronic small vessel ischemic disease with multiple chronic lacunar infarcts.  4. Chronic microhemorrhages likely related to hypertension.    Mr Jeri Cos Contrast 03/12/2019 IMPRESSION:  1. No pathologic enhancement within the brain.  2. Unchanged subacute hemorrhage positioned at the posterior left lentiform nucleus without significant regional mass effect.  3. Multiple additional acute to subacute cerebral infarcts, better assessed on recent noncontrast MRI.   Dg Chest Portable 1 View 03/11/2019 IMPRESSION:  Lingular/bilateral lower lobe atelectasis.  US Abdomen Limited Ruq 03/11/2019 IMPRESSION:  Hepatic steatosis, otherwise unremarkable right upper quadrant ultrasound.   CTA H&N  03/06/19  IMPRESSION: 1. No aneurysm, dissection or hemodynamically significant stenosis of the major cervical or intracranial arteries. 2. Unchanged size of hypertensive hemorrhage in left basal ganglia.  Transthoracic Echocardiogram  03/06/2019 IMPRESSIONS  1. Left ventricular ejection fraction, by visual estimation, is 55 to 60%. The left ventricle has normal function. There is mildly increased left ventricular hypertrophy.  2. Elevated left ventricular end-diastolic pressure.  3. Left ventricular diastolic parameters are consistent with Grade I diastolic dysfunction (impaired relaxation).  4. Global right  ventricle has normal systolic function.The right ventricular size is normal. No increase in right ventricular wall thickness.  5. Left atrial size was normal.  6. Right atrial size was normal.  7. The mitral valve is grossly normal. Trace mitral valve regurgitation.  8. The tricuspid valve is grossly normal. Tricuspid valve regurgitation is trivial.  9. The aortic valve is tricuspid. Aortic valve regurgitation is not visualized. No evidence of aortic valve sclerosis or stenosis. 10. The pulmonic valve was grossly normal. Pulmonic valve regurgitation is not visualized. 11. The inferior vena cava is normal in size with greater than 50% respiratory variability, suggesting right atrial pressure of 3 mmHg.   TEE - pending   ECG - SR rate 67 BPM. (See cardiology reading for complete details)   PHYSICAL EXAM Blood pressure (!) 173/87, pulse 72, temperature 98.9  F (37.2 C), temperature source Oral, resp. rate 20, height '5\' 8"'$  (1.727 m), SpO2 98 %. AAOx4 Expressive aphasia > receptive aphasia R facial droop No dysarthria Motor strength 5/5 in all extremities No sensory deficit R field cut PERRL, EMOI No cerebellar signs  Deep Tendon Reflexes: 3+ RUE, 2+ LUE biceps and triceps, 2+ patellar bilaterally Plantars: Right: upgoing                                Left: downgoing       ASSESSMENT/PLAN Belinda Banks is a 63 y.o. female with history of HTN, DM, and recent admission 03/05/2019 for left basal ganglia hemorrhage who presented to [facility] ED with c/o generalized weakness, nausea and slight confusion.. She did not receive IV t-PA due to [place].  Stroke:  Multiple additional acute to subacute bilateral cerebral infarcts - embolic - cardioembolic infarctions, infarctions from septic emboli or vasculitis.  Code Stroke CT Head - not ordered  CT head - Grossly stable intraparenchymal hemorrhage is noted in the left basal ganglia compared to prior exam  MRI head W&WO - Multiple acute to  early subacute cerebral infarcts bilaterally, largest in the posterior left MCA region. Subacute hemorrhage in the left lentiform nucleus, unchanged in size from 03/05/2019. Moderate chronic small vessel ischemic disease with multiple chronic lacunar infarcts.  MRA head - not ordered  CTA H&N - 03/06/19 - No aneurysm, dissection or hemodynamically significant stenosis of the major cervical or intracranial arteries. Unchanged size of hypertensive hemorrhage in left basal ganglia.  CT Perfusion - not ordered  Carotid Doppler - CTA neck performed - carotid dopplers not indicated.  2D Echo - 03/06/19 - EF 55 to 60%. No cardiac source of emboli identified.   TEE - pending  Lacey Jensen Virus 2 - negative  LDL - 106 (03/06/19)  HgbA1c - 7.6 (03/06/19)  UDS - not ordered - (negative 03/06/19)  VTE prophylaxis - SCDs Diet  Diet Order            Diet heart healthy/carb modified Room service appropriate? Yes; Fluid consistency: Thin  Diet effective now              No antithrombotic prior to admission, now on No antithrombotic  Ongoing aggressive stroke risk factor management  Therapy recommendations:  pending  Disposition:  Pending  Hypertension  Home BP meds: losartan, amlodipine, labetalol  Current BP meds: losartan, amlodipine, labetalol  Blood pressure somewhat high at times  - within post stroke/TIA parameters - but hx of recent [place] ICH . 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  TEE - pending  Vasculitis labs - pending  Maxipime and Vancomycin started 03/11/19 for  possible endocarditis  Elevated LFTs  Hepatitis panel -nonreactive  RUQ US - Hepatic steatosis, otherwise unremarkable right upper quadrant ultrasound.    Other Stroke Risk Factors  Advanced age  Former cigarette smoker - quit  ETOH use, advised to drink no more than 1 alcoholic beverage per day.  Hx stroke/TIA  Other Active Problems  Hypokalemia - 3.0 - supplement  Hypomagnesemia - 1.6 (03/06/19) - recheck in AM   Hospital day # 1  Patient remains afebrile Does not appear ill and to be bacteremic, awaiting Bcx TEE pending Continue empiric abx Would repeat CTA head and neck if concern for vasculitis but ESR is normal and no fever Cardioembolic is most likely the source, will need TEE and ILR placement Consider also LE [place] and hypercoagulable panel as strokes appear embolic in nature Can consider starting baby ASA for stroke prevention 10-14 days after her ICH, will need to confirm the date for her ICH  To contact Stroke Continuity provider, please refer to [email or web address] After hours, contact General Neurology

## 2019-03-13 ENCOUNTER — Encounter (HOSPITAL_COMMUNITY): Payer: Managed Care, Other (non HMO)

## 2019-03-13 LAB — GLUCOSE, CAPILLARY
Glucose-Capillary: 97 mg/dL (ref 70–99)
Glucose-Capillary: 129 mg/dL — ABNORMAL HIGH (ref 70–99)
Glucose-Capillary: 122 mg/dL — ABNORMAL HIGH (ref 70–99)
Glucose-Capillary: 132 mg/dL — ABNORMAL HIGH (ref 70–99)

## 2019-03-13 LAB — CBC
HCT: 39.3 % (ref 36.0–46.0)
Hemoglobin: 14.7 g/dL (ref 12.0–15.0)
MCH: 36.8 pg — ABNORMAL HIGH (ref 26.0–34.0)
MCHC: 37.4 g/dL — ABNORMAL HIGH (ref 30.0–36.0)
MCV: 98.5 fL (ref 80.0–100.0)
Platelets: 193 10*3/uL (ref 150–400)
RBC: 3.99 MIL/uL (ref 3.87–5.11)
RDW: 11.6 % (ref 11.5–15.5)
WBC: 8.5 10*3/uL (ref 4.0–10.5)
nRBC: 0 % (ref 0.0–0.2)

## 2019-03-13 LAB — BASIC METABOLIC PANEL
Anion gap: 10 (ref 5–15)
BUN: 8 mg/dL (ref 8–23)
CO2: 22 mmol/L (ref 22–32)
Calcium: 8.9 mg/dL (ref 8.9–10.3)
Chloride: 104 mmol/L (ref 98–111)
Creatinine, Ser: 0.61 mg/dL (ref 0.44–1.00)
GFR calc Af Amer: >60 mL/min (ref >60)
GFR calc non Af Amer: >60 mL/min (ref >60)
Glucose, Bld: 109 mg/dL — ABNORMAL HIGH (ref 70–99)
Potassium: 3.3 mmol/L — ABNORMAL LOW (ref 3.5–5.1)
Sodium: 136 mmol/L (ref 135–145)

## 2019-03-13 LAB — HEPATIC FUNCTION PANEL
ALT: 83 U/L — ABNORMAL HIGH (ref 0–44)
AST: 71 U/L — ABNORMAL HIGH (ref 15–41)
Albumin: 3 g/dL — ABNORMAL LOW (ref 3.5–5.0)
Alkaline Phosphatase: 65 U/L (ref 38–126)
Bilirubin, Direct: 0.6 mg/dL — ABNORMAL HIGH (ref 0.0–0.2)
Indirect Bilirubin: 1.6 mg/dL — ABNORMAL HIGH (ref 0.3–0.9)
Total Bilirubin: 2.2 mg/dL — ABNORMAL HIGH (ref 0.3–1.2)
Total Protein: 6.4 g/dL — ABNORMAL LOW (ref 6.5–8.1)

## 2019-03-13 LAB — URINE CULTURE: Culture: NO GROWTH

## 2019-03-13 LAB — LUPUS ANTICOAGULANT PANEL
DRVVT: 38.9 s (ref 0.0–47.0)
PTT Lupus Anticoagulant: 34.7 s (ref 0.0–51.9)

## 2019-03-13 LAB — VARICELLA-ZOSTER BY PCR

## 2019-03-13 LAB — MAGNESIUM: Magnesium: 1.7 mg/dL (ref 1.7–2.4)

## 2019-03-13 LAB — C4 COMPLEMENT: Complement C4, Body Fluid: 22 mg/dL (ref 12–38)

## 2019-03-13 LAB — C3 COMPLEMENT: C3 Complement: 137 mg/dL (ref 82–167)

## 2019-03-13 MED ORDER — POTASSIUM CHLORIDE CRYS ER 20 MEQ PO TBCR
20.0000 meq | EXTENDED_RELEASE_TABLET | Freq: Three times a day (TID) | ORAL | Status: AC
  Administered 2019-03-13 (×3): 20 meq via ORAL
  Filled 2019-03-13 (×3): qty 1

## 2019-03-13 NOTE — Progress Notes (Signed)
Occupational Therapy Treatment Patient Details Name: Belinda Banks MRN: [id number] DOB: 03-Apr-1956 Today's Date: 03/13/2019    History of present illness Belinda Banks is a 63 y.o. female with medical history significant of recent [place], hypertension, Type 2 diabetes, hyperlipidemia. Patient presented to the ED on11/20/2020 with increasing confusion and aphasia following a recent admission for [facility] 11/14-11/18. MRI shows multiple acute to early subacute cerebral infarct bilaterally with largest in the posterior left MCA region.   OT comments  Pt making steady progress towards OT goals this session. Pt completes functional mobility and standing grooming with supervision with no AD. Pt able to locate ADL items and obstacles in hallway on R side with no issues. Instructed pt on visual attention task where therapist placed  various letters on mirror in gym for pt to identify. Noted pt to neglect letter in R superior quadrant. Pt required increased time to recall birthday at start of session and again at end of session. Provided pt with strategies to assist with STM deficits. DC plan remains appropriate, will continue to follow acutely per POC.     Follow Up Recommendations  Outpatient OT;Supervision/Assistance - 24 hour    Equipment Recommendations  None recommended by OT    Recommendations for Other Services      Precautions / Restrictions Precautions Precautions: None Restrictions Weight Bearing Restrictions: No       Mobility Bed Mobility Overal bed mobility: Modified Independent Bed Mobility: Supine to Sit     Supine to sit: Modified independent (Device/Increase time)     General bed mobility comments: Pt required increased time to come to sitting EOB, required no assist  Transfers Overall transfer level: Needs assistance Equipment used: None Transfers: Sit to/from Stand Sit to Stand: Supervision         General transfer comment: Pt requiring supervision for  safety    Balance Overall balance assessment: No apparent balance deficits (not formally assessed)                                         ADL either performed or assessed with clinical judgement   ADL Overall ADL's : Needs assistance/impaired     Grooming: Brushing hair;Standing;Modified independent Grooming Details (indicate cue type and reason): pt able to brush through hair while standing at sink  with no AD. Able to locate items on R visual field with no issues             Lower Body Dressing: Supervision/safety;Sitting/lateral leans Lower Body Dressing Details (indicate cue type and reason): able to pull up socks from EOB Toilet Transfer: Supervision/safety;Ambulation Toilet Transfer Details (indicate cue type and reason): simulated via functional mobility with no AD, close supervision. pt able to attend to chair on R side         Functional mobility during ADLs: Supervision/safety General ADL Comments: pt performed standing grooming at sink able to locate items on R side with no issues. Pt completed functional mobility in hallway picking up on all safety hazards located in R visual field.     Vision Baseline Vision/History: Wears glasses Wears Glasses: Reading only Patient Visual Report: Blurring of vision;Other (comment)(blurry vision when reading, reports seeing aura in vision. Noted to neglect written letter in R superior quadrant, but when asked to read title on therapy handout no deficits noted)     Perception     Praxis  Cognition Arousal/Alertness: Awake/alert Behavior During Therapy: WFL for tasks assessed/performed Overall Cognitive Status: Impaired/Different from baseline Area of Impairment: Memory;Following commands;Awareness;Problem solving;Attention                   Current Attention Level: Sustained Memory: Decreased short-term memory Following Commands: Follows one step commands with increased time;Follows  multi-step commands with increased time   Awareness: Emergent Problem Solving: Slow processing General Comments: pt noted to require increased time to recall birthday at start of session and again at end of session. Pt reports STM deficits, education provided on STM strategies.        Exercises     Shoulder Instructions       General Comments pts husband present throughout session, supportive and helpful    Pertinent Vitals/ Pain       Pain Assessment: No/denies pain  Home Living                                          Prior Functioning/Environment              Frequency  Min 3X/week        Progress Toward Goals  OT Goals(current goals can now be found in the care plan section)  Progress towards OT goals: Progressing toward goals  Acute Rehab OT Goals Patient Stated Goal: "get back to normal." OT Goal Formulation: With patient/family Time For Goal Achievement: 03/26/19 Potential to Achieve Goals: Good  Plan Discharge plan remains appropriate    Co-evaluation                 AM-PAC OT "6 Clicks" Daily Activity     Outcome Measure   Help from another person eating meals?: None Help from another person taking care of personal grooming?: None Help from another person toileting, which includes using toliet, bedpan, or urinal?: None Help from another person bathing (including washing, rinsing, drying)?: A Little Help from another person to put on and taking off regular upper body clothing?: None Help from another person to put on and taking off regular lower body clothing?: None 6 Click Score: 23    End of Session    OT Visit Diagnosis: Muscle weakness (generalized) (M62.81);Low vision, both eyes (H54.2);Other symptoms and signs involving cognitive function;Cognitive communication deficit (R41.841) Symptoms and signs involving cognitive functions: Cerebral infarction Hemiplegia - Right/Left: Right Hemiplegia -  dominant/non-dominant: Dominant Hemiplegia - caused by: Other Nontraumatic intracranial hemorrhage   Activity Tolerance Patient tolerated treatment well   Patient Left in bed;with call bell/phone within reach;with family/visitor present   Nurse Communication          Time: [id number] OT Time Calculation (min): 23 min  Charges: OT General Charges $OT Visit: 1 Visit OT Treatments $Self Care/Home Management : 8-22 mins $Therapeutic Activity: 8-22 mins  Lanier Clam., COTA/L Acute Rehabilitation Services [phone]    Ihor Gully 03/13/2019, 4:13 PM

## 2019-03-13 NOTE — Progress Notes (Signed)
PROGRESS NOTE  Belinda Banks  DOB: [birth year]  PCP: Patient, No Pcp Per GGY:[id number]  DOA: 03/11/2019  LOS: 2 days   Chief Complaint  Patient presents with  . Weakness   Brief narrative: Belinda Banks is a 63 y.o. female with medical history significant of recent ICH, hypertension, Type 2 diabetes, hyperlipidemia. Patient presented to the ED on 03/11/2019 with increasing confusion and aphasia following a recent admission for ICH.  Patient was hospitalized from 11/14-11/18 for hypertensive emergency and hemorrhage in left basal ganglia requiring ICU admission and aggressive blood pressure control with Cleviprex.  She has right-sided droop and decreased grip strength due to the stroke.  She was discharged home with amlodipine, increased Cozarr and started on low dose Labetalol.  She was not discharged on aspirin because of hemorrhage.  Reportedly patient was compliant with medication.  On 11/19, patient was lethargic and stayed in bed all day.  In the night, she started to have increasing confusion and trouble word-finding.She also had vision changes and was unable to characterize because of word finding difficulty.  Next morning, symptoms did not improve and hence EMS was called and patient was brought to the ED.  On arrival, she was hypertensive up to 170/80s with normal HR and on room air. CBC showed no leukocytosis and had hemoglobin of 17.  CMP showed sodium of 132, glucose of 170, creatinine of 0.59, AST mildly elevated 63 and ALT of 68, total bilirubin of 3.1.  Lactic acid of 0.9.  Alcohol level less than 10. CT head shows grossly stable intraparenchymal hemorrhage on the left basal ganglia compared to prior exam. MRI shows multiple acute to early subacute cerebral infarct bilaterally with largest in the posterior left MCA region.  Subjective: Patient was seen and examined this morning.  Lying down in bed.  Not in distress.  Continues to have word finding difficulty and episodes of  anxiety.  Husband at bedside.    Assessment/Plan: Multifocal stroke bilateral acute to subacute cerebral infarcts Chronic lacunar infarcts Recent left basal ganglia area ICH -Presented with generalized weakness, confusion. -CT scan and MRI finding as above. -Neurology consultation appreciated.  Cardioembolic stroke is the likely cause.  However, need to rule out small vessel vasculitis as well as bacterial endocarditis multifocal strokes. -TTE in last admission was negative.  TEE planned for Monday.  To be considered on this admission.   -For vasculitis work-up, panel ordered including lupus antibody panel, lupus anticoagulant, C3, C4, ESR, CRP, serum cryoglobulins, hepatitis panel ANCA titers, anti-Jo IgG, RPR, HIV PCR and VZV PCR -In light of recent hemorrhage, will target for systolic blood pressure between 120 and 140. -Lipitor has been stopped because some studies suggest that it worsens outcome in patients with ICH. -PT/OT/ST evaluation. -Continue neurochecks and telemetry monitoring.  Gram-positive bacteremia -Coag negative staph aureus obtained on both aerobic and anaerobic bottles present on admission. -True infection versus contamination. -Currently on IV cefepime and IV vancomycin.  Repeat blood culture sent today. -Planned for TEE tomorrow.  Hypertension -Neurology recommends maintaining BP at 120-140 given recent Sasser  -will continue home amlodipine, Labetalol and losartan  Transaminitis/hyperbilirubinemia -Suspect secondary to alcohol use. -Asymptomatic with no abdominal findings -RUQ ultrasound showed hepatic steatosis. -Liver enzymes slightly up today.  Repeat tomorrow.  Chronic alcohol use -admits to several mixed drinks and wine nightly -CIWA protocol in place. -Counseled to quit alcohol.  Hyperlipidemia -Hold statin per neuro recs due to potential of worsening ICH  Type 2 diabetes -Continue to hold  Metformin -Moderate dose sliding scale  Body mass  index is 24.47 kg/m. Mobility: PT/OT eval DVT prophylaxis:  Avoid anticoagulant because of recent ICH.  SCDs. Code Status:   Code Status: DNR per H&P Family Communication:  Patient's husband at bedside.   Expected Discharge:  TEE tomorrow.  PT eval obtained.  Ultimate plan is to discharge to home. Consultants:  Neurology, cardiology for TEE  Procedures:    Antimicrobials: Anti-infectives (From admission, onward)   Start     Dose/Rate Route Frequency Ordered Stop   03/11/19 2330  vancomycin (VANCOCIN) IVPB 750 mg/150 ml premix     750 mg 150 mL/hr over 60 Minutes Intravenous Every 12 hours 03/11/19 2316     03/11/19 2330  ceFEPIme (MAXIPIME) 2 g in sodium chloride 0.9 % 100 mL IVPB     2 g 200 mL/hr over 30 Minutes Intravenous Every 8 hours 03/11/19 2316        Diet Order            Diet NPO time specified Except for: Sips with Meds  Diet effective midnight        Diet heart healthy/carb modified Room service appropriate? Yes; Fluid consistency: Thin  Diet effective now              Infusions:  . ceFEPime (MAXIPIME) IV Stopped (03/13/19 0744)  . vancomycin 750 mg (03/13/19 1001)    Scheduled Meds: . amLODipine  10 mg Oral Daily  . insulin aspart  0-15 Units Subcutaneous TID WC  . labetalol  100 mg Oral Q8H  . losartan  50 mg Oral BID  . potassium chloride  20 mEq Oral TID    PRN meds: acetaminophen **OR** acetaminophen (TYLENOL) oral liquid 160 mg/5 mL **OR** acetaminophen, LORazepam **OR** LORazepam, senna-docusate   Objective: Vitals:   03/13/19 0847 03/13/19 1155  BP: 125/70 (!) 144/80  Pulse: 69 63  Resp: 18 20  Temp: 98.6 F (37 C) 98.5 F (36.9 C)  SpO2: 97% 96%    Intake/Output Summary (Last 24 hours) at 03/13/2019 1439 Last data filed at 03/13/2019 1001 Gross per 24 hour  Intake 1750 ml  Output -  Net 1750 ml   There were no vitals filed for this visit. Weight change:  Body mass index is 24.47 kg/m.   Physical Exam: General exam:  Appears calm and comfortable.  Skin: No rashes, lesions or ulcers. HEENT: Atraumatic, normocephalic, supple neck, no obvious bleeding Lungs: Clear to auscultation bilaterally CVS: Regular rate and rhythm, no murmur GI/Abd soft, nontender, nondistended, bowel sound present CNS: Alert, awake, oriented to place.  Has significant memory and word finding difficulty with insight.  Psychiatry: Frustrated with memory deficit Extremities: No pedal edema, no calf tenderness  Data Review: I have personally reviewed the laboratory data and studies available.  Recent Labs  Lab 03/07/19 0427 03/08/19 0538 03/09/19 0428 03/11/19 1037 03/13/19 0310  WBC 9.3 10.5 9.7 10.5 8.5  NEUTROABS  --   --   --  9.5*  --   HGB 16.3* 16.0* 15.5* 17.0* 14.7  HCT 44.4 44.1 42.8 45.6 39.3  MCV 99.6 102.8* 100.9* 99.1 98.5  PLT 180 179 168 215 193   Recent Labs  Lab 03/06/19 1739  03/08/19 0538 03/09/19 0428 03/11/19 1037 03/12/19 0324 03/13/19 0310  NA  --    < > 136 134* 132* 134* 136  K 3.6   < > 4.2 3.9 3.5 3.0* 3.3*  CL  --    < >  101 100 96* 102 104  CO2  --    < > 23 22 21* 20* 22  GLUCOSE  --    < > 146* 129* 170* 164* 109*  BUN  --    < > _0 CREATININE  --    < > 0.78 0.69 0.59 0.65 0.61  CALCIUM  --    < > 8.7* 8.7* 9.0 8.5* 8.9  MG 1.6*  --   --   --   --  1.7 1.7   < > = values in this interval not displayed.   Recent Labs  Lab 03/11/19 1037 03/12/19 0324 03/13/19 0310  AST 63* 51* 71*  ALT 68* 60* 83*  ALKPHOS 79 72 65  BILITOT 3.1* 2.5* 2.2*  PROT 7.3 6.3* 6.4*  ALBUMIN 3.6 3.1* 3.0*    Terrilee Croak, MD  [facility] 03/13/2019

## 2019-03-13 NOTE — H&P (View-Only) (Signed)
PROGRESS NOTE  Belinda Banks  DOB: [birth year]  PCP: Patient, No Pcp Per GGY:[id number]  DOA: 03/11/2019  LOS: 2 days   Chief Complaint  Patient presents with  . Weakness   Brief narrative: Belinda Banks is a 63 y.o. female with medical history significant of recent ICH, hypertension, Type 2 diabetes, hyperlipidemia. Patient presented to the ED on 03/11/2019 with increasing confusion and aphasia following a recent admission for ICH.  Patient was hospitalized from 11/14-11/18 for hypertensive emergency and hemorrhage in left basal ganglia requiring ICU admission and aggressive blood pressure control with Cleviprex.  She has right-sided droop and decreased grip strength due to the stroke.  She was discharged home with amlodipine, increased Cozarr and started on low dose Labetalol.  She was not discharged on aspirin because of hemorrhage.  Reportedly patient was compliant with medication.  On 11/19, patient was lethargic and stayed in bed all day.  In the night, she started to have increasing confusion and trouble word-finding.She also had vision changes and was unable to characterize because of word finding difficulty.  Next morning, symptoms did not improve and hence EMS was called and patient was brought to the ED.  On arrival, she was hypertensive up to 170/80s with normal HR and on room air. CBC showed no leukocytosis and had hemoglobin of 17.  CMP showed sodium of 132, glucose of 170, creatinine of 0.59, AST mildly elevated 63 and ALT of 68, total bilirubin of 3.1.  Lactic acid of 0.9.  Alcohol level less than 10. CT head shows grossly stable intraparenchymal hemorrhage on the left basal ganglia compared to prior exam. MRI shows multiple acute to early subacute cerebral infarct bilaterally with largest in the posterior left MCA region.  Subjective: Patient was seen and examined this morning.  Lying down in bed.  Not in distress.  Continues to have word finding difficulty and episodes of  anxiety.  Husband at bedside.    Assessment/Plan: Multifocal stroke bilateral acute to subacute cerebral infarcts Chronic lacunar infarcts Recent left basal ganglia area ICH -Presented with generalized weakness, confusion. -CT scan and MRI finding as above. -Neurology consultation appreciated.  Cardioembolic stroke is the likely cause.  However, need to rule out small vessel vasculitis as well as bacterial endocarditis multifocal strokes. -TTE in last admission was negative.  TEE planned for Monday.  To be considered on this admission.   -For vasculitis work-up, panel ordered including lupus antibody panel, lupus anticoagulant, C3, C4, ESR, CRP, serum cryoglobulins, hepatitis panel ANCA titers, anti-Jo IgG, RPR, HIV PCR and VZV PCR -In light of recent hemorrhage, will target for systolic blood pressure between 120 and 140. -Lipitor has been stopped because some studies suggest that it worsens outcome in patients with ICH. -PT/OT/ST evaluation. -Continue neurochecks and telemetry monitoring.  Gram-positive bacteremia -Coag negative staph aureus obtained on both aerobic and anaerobic bottles present on admission. -True infection versus contamination. -Currently on IV cefepime and IV vancomycin.  Repeat blood culture sent today. -Planned for TEE tomorrow.  Hypertension -Neurology recommends maintaining BP at 120-140 given recent Sasser  -will continue home amlodipine, Labetalol and losartan  Transaminitis/hyperbilirubinemia -Suspect secondary to alcohol use. -Asymptomatic with no abdominal findings -RUQ ultrasound showed hepatic steatosis. -Liver enzymes slightly up today.  Repeat tomorrow.  Chronic alcohol use -admits to several mixed drinks and wine nightly -CIWA protocol in place. -Counseled to quit alcohol.  Hyperlipidemia -Hold statin per neuro recs due to potential of worsening ICH  Type 2 diabetes -Continue to hold  Metformin -Moderate dose sliding scale  Body mass  index is 24.47 kg/m. Mobility: PT/OT eval DVT prophylaxis:  Avoid anticoagulant because of recent ICH.  SCDs. Code Status:   Code Status: DNR per H&P Family Communication:  Patient's husband at bedside.   Expected Discharge:  TEE tomorrow.  PT eval obtained.  Ultimate plan is to discharge to home. Consultants:  Neurology, cardiology for TEE  Procedures:    Antimicrobials: Anti-infectives (From admission, onward)   Start     Dose/Rate Route Frequency Ordered Stop   03/11/19 2330  vancomycin (VANCOCIN) IVPB 750 mg/150 ml premix     750 mg 150 mL/hr over 60 Minutes Intravenous Every 12 hours 03/11/19 2316     03/11/19 2330  ceFEPIme (MAXIPIME) 2 g in sodium chloride 0.9 % 100 mL IVPB     2 g 200 mL/hr over 30 Minutes Intravenous Every 8 hours 03/11/19 2316        Diet Order            Diet NPO time specified Except for: Sips with Meds  Diet effective midnight        Diet heart healthy/carb modified Room service appropriate? Yes; Fluid consistency: Thin  Diet effective now              Infusions:  . ceFEPime (MAXIPIME) IV Stopped (03/13/19 0744)  . vancomycin 750 mg (03/13/19 1001)    Scheduled Meds: . amLODipine  10 mg Oral Daily  . insulin aspart  0-15 Units Subcutaneous TID WC  . labetalol  100 mg Oral Q8H  . losartan  50 mg Oral BID  . potassium chloride  20 mEq Oral TID    PRN meds: acetaminophen **OR** acetaminophen (TYLENOL) oral liquid 160 mg/5 mL **OR** acetaminophen, LORazepam **OR** LORazepam, senna-docusate   Objective: Vitals:   03/13/19 0847 03/13/19 1155  BP: 125/70 (!) 144/80  Pulse: 69 63  Resp: 18 20  Temp: 98.6 F (37 C) 98.5 F (36.9 C)  SpO2: 97% 96%    Intake/Output Summary (Last 24 hours) at 03/13/2019 1439 Last data filed at 03/13/2019 1001 Gross per 24 hour  Intake 1750 ml  Output -  Net 1750 ml   There were no vitals filed for this visit. Weight change:  Body mass index is 24.47 kg/m.   Physical Exam: General exam:  Appears calm and comfortable.  Skin: No rashes, lesions or ulcers. HEENT: Atraumatic, normocephalic, supple neck, no obvious bleeding Lungs: Clear to auscultation bilaterally CVS: Regular rate and rhythm, no murmur GI/Abd soft, nontender, nondistended, bowel sound present CNS: Alert, awake, oriented to place.  Has significant memory and word finding difficulty with insight.  Psychiatry: Frustrated with memory deficit Extremities: No pedal edema, no calf tenderness  Data Review: I have personally reviewed the laboratory data and studies available.  Recent Labs  Lab 03/07/19 0427 03/08/19 0538 03/09/19 0428 03/11/19 1037 03/13/19 0310  WBC 9.3 10.5 9.7 10.5 8.5  NEUTROABS  --   --   --  9.5*  --   HGB 16.3* 16.0* 15.5* 17.0* 14.7  HCT 44.4 44.1 42.8 45.6 39.3  MCV 99.6 102.8* 100.9* 99.1 98.5  PLT 180 179 168 215 193   Recent Labs  Lab 03/06/19 1739  03/08/19 0538 03/09/19 0428 03/11/19 1037 03/12/19 0324 03/13/19 0310  NA  --    < > 136 134* 132* 134* 136  K 3.6   < > 4.2 3.9 3.5 3.0* 3.3*  CL  --    < >  101 100 96* 102 104  CO2  --    < > 23 22 21* 20* 22  GLUCOSE  --    < > 146* 129* 170* 164* 109*  BUN  --    < > _0 CREATININE  --    < > 0.78 0.69 0.59 0.65 0.61  CALCIUM  --    < > 8.7* 8.7* 9.0 8.5* 8.9  MG 1.6*  --   --   --   --  1.7 1.7   < > = values in this interval not displayed.   Recent Labs  Lab 03/11/19 1037 03/12/19 0324 03/13/19 0310  AST 63* 51* 71*  ALT 68* 60* 83*  ALKPHOS 79 72 65  BILITOT 3.1* 2.5* 2.2*  PROT 7.3 6.3* 6.4*  ALBUMIN 3.6 3.1* 3.0*    Terrilee Croak, MD  [facility] 03/13/2019

## 2019-03-13 NOTE — Plan of Care (Signed)
  Problem: Clinical Measurements: Goal: Ability to maintain clinical measurements within normal limits will improve Outcome: Progressing   Problem: Health Behavior/Discharge Planning: Goal: Ability to manage health-related needs will improve Outcome: Progressing

## 2019-03-13 NOTE — Progress Notes (Signed)
STROKE TEAM PROGRESS NOTE   HISTORY OF PRESENT ILLNESS (per record) Belinda Banks is an 63 y.o. female  With [place] ICH ( 02/2019), HTN who presented to [facility] ED with c/o generalized weakness, nausea and slight confusion.  Per husband and chart review, the patient was admitted to ICU last week and was discharged home after diagnosis and management of a left basal ganglia hemorrhage, which was felt most likely to have been to hypertension. No MRI was obtained during prior admission, but CTA was negative for aneurysm or significant stenosis. Patient was not discharged on ASA d/t hemorrhage.   Yesterday (Thursday) after she was back home he noticed that she was tired and sleeping a lot. She had some confusion Thursday afternoon. She c/o HA and nausea. When she woke up today (Friday) she was sitting on the side of the bed and said "she was not feeling right". EMS was called.  Denies CP, SOB, fever, chills or trouble talking.   Here in the ED an MRI was obtained, revealing the recent hemorrhage, an old pontine microhemorrhage, a medium sized subacute left parietal lobe ischemic infarction that appears to be new relative to the CT head performed 5 days ago, and multiple acute small ischemic infarctions in the bilateral cerebral hemispheres in a distribution most consistent with either cardioembolic infarctions, infarctions from septic emboli or vasculitis.   ED course:  BP: 186/97 BG: 170  CTH: Grossly stable intraparenchymal hemorrhage is noted in the left basal ganglia compared to prior exam. New low density is noted involving the left posterior parietal cortex with sulcal effacement concerning for acute infarction.  MRI: multiple acute to early subacute cerebral infarcts bilaterally, chronic lacunar infarcts  Recent admission 03/05/2019: Presented with slurred speech and right side weakness. Found to have [place].   Modified Rankin: Rankin Score=1 NIHSS: 3   INTERVAL HISTORY  (subjective) Patient is resting comfortably in bed, states she feels like her speech has improved and has no complaints.    OBJECTIVE Vitals:   03/12/19 2300 03/12/19 2304 03/13/19 0409 03/13/19 0847  BP: 139/71 139/71 (!) 142/77 125/70  Pulse: 66 66 63 69  Resp: '16 16 16 18  '$ Temp: 98 F (36.7 C) 98 F (36.7 C) 98.5 F (36.9 C) 98.6 F (37 C)  TempSrc: Oral Oral Oral Oral  SpO2: 98% 98% 100% 97%  Height:        CBC:  Recent Labs  Lab 03/11/19 1037 03/13/19 0310  WBC 10.5 8.5  NEUTROABS 9.5*  --   HGB 17.0* 14.7  HCT 45.6 39.3  MCV 99.1 98.5  PLT 215 283    Basic Metabolic Panel:  Recent Labs  Lab 03/12/19 0324 03/13/19 0310  NA 134* 136  K 3.0* 3.3*  CL 102 104  CO2 20* 22  GLUCOSE 164* 109*  BUN 10 8  CREATININE 0.65 0.61  CALCIUM 8.5* 8.9  MG 1.7 1.7    Lipid Panel:     Component Value Date/Time   CHOL 191 03/06/2019 0445   TRIG 200 (H) 03/06/2019 0445   HDL 45 03/06/2019 0445   CHOLHDL 4.2 03/06/2019 0445   VLDL 40 03/06/2019 0445   LDLCALC 106 (H) 03/06/2019 0445   HgbA1c:  Lab Results  Component Value Date   HGBA1C 7.6 (H) 03/06/2019   Urine Drug Screen:     Component Value Date/Time   LABOPIA NONE DETECTED 03/06/2019 1242   COCAINSCRNUR NONE DETECTED 03/06/2019 1242   LABBENZ NONE DETECTED 03/06/2019 1242   AMPHETMU  NONE DETECTED 03/06/2019 [place] 03/06/2019 1242   LABBARB NONE DETECTED 03/06/2019 1242    Alcohol Level     Component Value Date/Time   ETH <10 03/11/2019 1132    IMAGING  Ct Head Wo Contrast 03/11/2019 IMPRESSION:  Grossly stable intraparenchymal hemorrhage is noted in the left basal ganglia compared to prior exam. New low density is noted involving the left posterior parietal cortex with sulcal effacement concerning for acute infarction.   Mr Brain Wo Contrast 03/11/2019 IMPRESSION:  1. Multiple acute to early subacute cerebral infarcts bilaterally, largest in the posterior left MCA  region.  2. Subacute hemorrhage in the left lentiform nucleus, unchanged in size from 03/05/2019. Mild edema without significant mass effect.  3. Moderate chronic small vessel ischemic disease with multiple chronic lacunar infarcts.  4. Chronic microhemorrhages likely related to hypertension.    Mr Jeri Cos Contrast 03/12/2019 IMPRESSION:  1. No pathologic enhancement within the brain.  2. Unchanged subacute hemorrhage positioned at the posterior left lentiform nucleus without significant regional mass effect.  3. Multiple additional acute to subacute cerebral infarcts, better assessed on recent noncontrast MRI.   Dg Chest Portable 1 View 03/11/2019 IMPRESSION:  Lingular/bilateral lower lobe atelectasis.  US Abdomen Limited Ruq 03/11/2019 IMPRESSION:  Hepatic steatosis, otherwise unremarkable right upper quadrant ultrasound.   CTA H&N  03/06/19  IMPRESSION: 1. No aneurysm, dissection or hemodynamically significant stenosis of the major cervical or intracranial arteries. 2. Unchanged size of hypertensive hemorrhage in left basal ganglia.  Transthoracic Echocardiogram  03/06/2019 IMPRESSIONS  1. Left ventricular ejection fraction, by visual estimation, is 55 to 60%. The left ventricle has normal function. There is mildly increased left ventricular hypertrophy.  2. Elevated left ventricular end-diastolic pressure.  3. Left ventricular diastolic parameters are consistent with Grade I diastolic dysfunction (impaired relaxation).  4. Global right ventricle has normal systolic function.The right ventricular size is normal. No increase in right ventricular wall thickness.  5. Left atrial size was normal.  6. Right atrial size was normal.  7. The mitral valve is grossly normal. Trace mitral valve regurgitation.  8. The tricuspid valve is grossly normal. Tricuspid valve regurgitation is trivial.  9. The aortic valve is tricuspid. Aortic valve regurgitation is not visualized. No evidence  of aortic valve sclerosis or stenosis. 10. The pulmonic valve was grossly normal. Pulmonic valve regurgitation is not visualized. 11. The inferior vena cava is normal in size with greater than 50% respiratory variability, suggesting right atrial pressure of 3 mmHg.   TEE - pending   ECG - SR rate 67 BPM. (See cardiology reading for complete details)   PHYSICAL EXAM Blood pressure 125/70, pulse 69, temperature 98.6 F (37 C), temperature source Oral, resp. rate 18, height '5\' 8"'$  (1.727 m), SpO2 97 %. AAOx4 Expressive aphasia > receptive aphasia, improved this morning  R facial droop No dysarthria Motor strength 5/5 in all extremities No sensory deficit R field cut PERRL, EMOI No cerebellar signs  Deep Tendon Reflexes: 3+ RUE, 2+ LUE biceps and triceps, 2+ patellar bilaterally Plantars: Right: upgoing                                Left: downgoing       ASSESSMENT/PLAN Belinda Banks is a 63 y.o. female with history of HTN, DM, and recent admission 03/05/2019 for left basal ganglia hemorrhage who presented to [facility] ED with c/o generalized weakness, nausea  and slight confusion.. She did not receive IV t-PA due to [place].  Stroke:  Multiple additional acute to subacute bilateral cerebral infarcts - embolic - cardioembolic infarctions, infarctions from septic emboli or vasculitis.  Code Stroke CT Head - not ordered  CT head - Grossly stable intraparenchymal hemorrhage is noted in the left basal ganglia compared to prior exam  MRI head W&WO - Multiple acute to early subacute cerebral infarcts bilaterally, largest in the posterior left MCA region. Subacute hemorrhage in the left lentiform nucleus, unchanged in size from 03/05/2019. Moderate chronic small vessel ischemic disease with multiple chronic lacunar infarcts.  MRA head - not ordered  CTA H&N - 03/06/19 - No aneurysm, dissection or hemodynamically significant stenosis of the major cervical or intracranial arteries.  Unchanged size of hypertensive hemorrhage in left basal ganglia.  CT Perfusion - not ordered  Carotid Doppler - CTA neck performed - carotid dopplers not indicated.  2D Echo - 03/06/19 - EF 55 to 60%. No cardiac source of emboli identified.   TEE - pending  Lacey Jensen Virus 2 - negative  LDL - 106 (03/06/19)  HgbA1c - 7.6 (03/06/19)  UDS - not ordered - (negative 03/06/19)  VTE prophylaxis - SCDs Diet  Diet Order            Diet heart healthy/carb modified Room service appropriate? Yes; Fluid consistency: Thin  Diet effective now              No antithrombotic prior to admission, now on No antithrombotic  Ongoing aggressive stroke risk factor management  Therapy recommendations:  Outpt OT - No f/u PT  Disposition:  Pending  Hypertension  Home BP meds: losartan, amlodipine, labetalol  Current BP meds: losartan, amlodipine, labetalol  Blood pressure somewhat high at times  - within post stroke/TIA parameters - but hx of recent [place] ICH . 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  TEE - pending  Vasculitis labs - pending  Maxipime and Vancomycin started 03/11/19 for possible endocarditis  Blood cultures - pending, prelim seem to be positive for GPC  Elevated LFTs  Hepatitis panel -nonreactive  RUQ US - Hepatic steatosis, otherwise unremarkable right upper quadrant ultrasound.    Other Stroke Risk Factors  Advanced age  Former cigarette smoker - quit  ETOH use, advised to drink no more than 1 alcoholic beverage per day.  Hx stroke/TIA  Other Active  Problems  Hypokalemia - 3.0 - supplement -> 3.3 -> supplement further  Hypomagnesemia - 1.6 (03/06/19) -> 1.7  ESR - 12  PLAN  TEE was ordered on admission by Dr Cheral Marker. I will try to get it scheduled for Monday and make pt NPO after midnight.  Pt will need loop implant as well if TEE is negative for vegetations    Hospital day # 2  Patient remains afebrile Prelim Bcx appear to be positive for GPC TEE pending Continue abx as this now appears to be septic emboli in nature  Cardioembolic is most likely the source, will need TEE and ILR placement to rule in/out vegetations  Consider also LE [place] and hypercoagulable panel as strokes appear embolic in  nature if TEE is negative  Would hold off on ASA as this may in septic emboli in nature and would have increased risk for hemorraghic transformation  To contact Stroke Continuity provider, please refer to [email or web address] After hours, contact General Neurology

## 2019-03-14 ENCOUNTER — Encounter (HOSPITAL_COMMUNITY)
Admission: EM | Disposition: A | Discharge status: Home / Self Care | Payer: Self-pay | Source: Home / Self Care | Attending: Internal Medicine

## 2019-03-14 ENCOUNTER — Encounter (HOSPITAL_COMMUNITY): Payer: Self-pay | Admitting: Emergency Medicine

## 2019-03-14 ENCOUNTER — Inpatient Hospital Stay (HOSPITAL_COMMUNITY): Payer: Managed Care, Other (non HMO)

## 2019-03-14 DIAGNOSIS — I639 Cerebral infarction, unspecified: Secondary | ICD-10-CM

## 2019-03-14 DIAGNOSIS — I6300 Cerebral infarction due to thrombosis of unspecified precerebral artery: Secondary | ICD-10-CM

## 2019-03-14 HISTORY — PX: TEE WITHOUT CARDIOVERSION: SHX5443

## 2019-03-14 HISTORY — PX: BUBBLE STUDY: SHX6837

## 2019-03-14 LAB — CULTURE, BLOOD (ROUTINE X 2): Special Requests: ADEQUATE

## 2019-03-14 LAB — COMPREHENSIVE METABOLIC PANEL
ALT: 90 U/L — ABNORMAL HIGH (ref 0–44)
AST: 61 U/L — ABNORMAL HIGH (ref 15–41)
Albumin: 3 g/dL — ABNORMAL LOW (ref 3.5–5.0)
Alkaline Phosphatase: 64 U/L (ref 38–126)
Anion gap: 9 (ref 5–15)
BUN: 8 mg/dL (ref 8–23)
CO2: 23 mmol/L (ref 22–32)
Calcium: 9 mg/dL (ref 8.9–10.3)
Chloride: 104 mmol/L (ref 98–111)
Creatinine, Ser: 0.57 mg/dL (ref 0.44–1.00)
GFR calc Af Amer: >60 mL/min (ref >60)
GFR calc non Af Amer: >60 mL/min (ref >60)
Glucose, Bld: 109 mg/dL — ABNORMAL HIGH (ref 70–99)
Potassium: 3.7 mmol/L (ref 3.5–5.1)
Sodium: 136 mmol/L (ref 135–145)
Total Bilirubin: 2.2 mg/dL — ABNORMAL HIGH (ref 0.3–1.2)
Total Protein: 6.5 g/dL (ref 6.5–8.1)

## 2019-03-14 LAB — GLUCOSE, CAPILLARY
Glucose-Capillary: 125 mg/dL — ABNORMAL HIGH (ref 70–99)
Glucose-Capillary: 129 mg/dL — ABNORMAL HIGH (ref 70–99)
Glucose-Capillary: 226 mg/dL — ABNORMAL HIGH (ref 70–99)
Glucose-Capillary: 165 mg/dL — ABNORMAL HIGH (ref 70–99)

## 2019-03-14 LAB — CBC
HCT: 39.8 % (ref 36.0–46.0)
Hemoglobin: 14.7 g/dL (ref 12.0–15.0)
MCH: 37 pg — ABNORMAL HIGH (ref 26.0–34.0)
MCHC: 36.9 g/dL — ABNORMAL HIGH (ref 30.0–36.0)
MCV: 100.3 fL — ABNORMAL HIGH (ref 80.0–100.0)
Platelets: 192 10*3/uL (ref 150–400)
RBC: 3.97 MIL/uL (ref 3.87–5.11)
RDW: 11.5 % (ref 11.5–15.5)
WBC: 8.6 10*3/uL (ref 4.0–10.5)
nRBC: 0 % (ref 0.0–0.2)

## 2019-03-14 LAB — ANCA TITERS
Atypical P-ANCA titer: <1:20 {titer}
C-ANCA: <1:20 {titer}
P-ANCA: <1:20 {titer}

## 2019-03-14 SURGERY — ECHOCARDIOGRAM, TRANSESOPHAGEAL
Anesthesia: Moderate Sedation

## 2019-03-14 MED ORDER — MIDAZOLAM HCL (PF) 10 MG/2ML IJ SOLN
INTRAMUSCULAR | Status: DC | PRN
  Administered 2019-03-14: 1 mg via INTRAVENOUS
  Administered 2019-03-14 (×2): 2 mg via INTRAVENOUS

## 2019-03-14 MED ORDER — FENTANYL CITRATE (PF) 100 MCG/2ML IJ SOLN
INTRAMUSCULAR | Status: AC
  Filled 2019-03-14: qty 2

## 2019-03-14 MED ORDER — MIDAZOLAM HCL (PF) 5 MG/ML IJ SOLN
INTRAMUSCULAR | Status: AC
  Filled 2019-03-14: qty 2

## 2019-03-14 MED ORDER — HYDRALAZINE HCL 20 MG/ML IJ SOLN: 10.0000 mg | Freq: Four times a day (QID) | INTRAMUSCULAR | Status: DC | PRN

## 2019-03-14 MED ORDER — SODIUM CHLORIDE 0.9 % IV SOLN: INTRAVENOUS | Status: DC

## 2019-03-14 MED ORDER — BUTAMBEN-TETRACAINE-BENZOCAINE 2-2-14 % EX AERO
INHALATION_SPRAY | CUTANEOUS | Status: DC | PRN
  Administered 2019-03-14: 2 via TOPICAL

## 2019-03-14 MED ORDER — ASPIRIN 81 MG PO CHEW
81.0000 mg | CHEWABLE_TABLET | Freq: Every day | ORAL | Status: DC
  Administered 2019-03-14 – 2019-03-15 (×2): 81 mg via ORAL
  Filled 2019-03-14 (×2): qty 1

## 2019-03-14 MED ORDER — FENTANYL CITRATE (PF) 100 MCG/2ML IJ SOLN
INTRAMUSCULAR | Status: DC | PRN
  Administered 2019-03-14 (×3): 25 ug via INTRAVENOUS

## 2019-03-14 NOTE — Plan of Care (Signed)
  Problem: Education: Goal: Knowledge of General Education information will improve Description: Including pain rating scale, medication(s)/side effects and non-pharmacologic comfort measures Outcome: Adequate for Discharge   Problem: Health Behavior/Discharge Planning: Goal: Ability to manage health-related needs will improve Outcome: Adequate for Discharge   Problem: Elimination: Goal: Will not experience complications related to bowel motility Outcome: Adequate for Discharge Goal: Will not experience complications related to urinary retention Outcome: Adequate for Discharge   Problem: Pain Managment: Goal: General experience of comfort will improve Outcome: Adequate for Discharge

## 2019-03-14 NOTE — Interval H&P Note (Signed)
History and Physical Interval Note:  03/14/2019 12:07 PM  Nihal H Brackins  has presented today for surgery, with the diagnosis of Stroke.  The various methods of treatment have been discussed with the patient and family. After consideration of risks, benefits and other options for treatment, the patient has consented to  Procedure(s): TRANSESOPHAGEAL ECHOCARDIOGRAM (TEE) (N/A) as a surgical intervention.  The patient's history has been reviewed, patient examined, no change in status, stable for surgery.  I have reviewed the patient's chart and labs.  Questions were answered to the patient's satisfaction.     Mertie Moores

## 2019-03-14 NOTE — Progress Notes (Signed)
  Echocardiogram Echocardiogram Transesophageal has been performed.  Belinda Banks 03/14/2019, 12:43 PM

## 2019-03-14 NOTE — Interval H&P Note (Signed)
History and Physical Interval Note:  03/14/2019 11:33 AM  Belinda Banks  has presented today for surgery, with the diagnosis of Stroke.  The various methods of treatment have been discussed with the patient and family. After consideration of risks, benefits and other options for treatment, the patient has consented to  Procedure(s): TRANSESOPHAGEAL ECHOCARDIOGRAM (TEE) (N/A) as a surgical intervention.  The patient's history has been reviewed, patient examined, no change in status, stable for surgery.  I have reviewed the patient's chart and labs.  Questions were answered to the patient's satisfaction.     Mertie Moores

## 2019-03-14 NOTE — Progress Notes (Signed)
VASCULAR LAB PRELIMINARY  PRELIMINARY  PRELIMINARY  PRELIMINARY  Bilateral lower extremity venous duplex completed.    Preliminary report:  See CV proc for preliminary results.   Jyden Kromer, RVT 03/14/2019, 4:44 PM

## 2019-03-14 NOTE — Progress Notes (Signed)
PROGRESS NOTE  Belinda Banks  DOB: [birth year]  PCP: Patient, No Pcp Per [id number]  DOA: 03/11/2019  LOS: 3 days   Chief Complaint  Patient presents with  . Weakness   Brief narrative: Belinda Banks is a 63 y.o. female with medical history significant of recent ICH, hypertension, Type 2 diabetes, hyperlipidemia. Patient presented to the ED on 03/11/2019 with increasing confusion and aphasia following a recent admission for ICH.  Patient was hospitalized from 11/14-11/18 for hypertensive emergency and hemorrhage in left basal ganglia requiring ICU admission and aggressive blood pressure control with Cleviprex.  She has right-sided droop and decreased grip strength due to the stroke.  She was discharged home with amlodipine, increased [place] and started on low dose Labetalol.  She was not discharged on aspirin because of hemorrhage.  Reportedly patient was compliant with medication.  On 11/19, patient was lethargic and stayed in bed all day.  In the night, she started to have increasing confusion and trouble word-finding.She also had vision changes and was unable to characterize because of word finding difficulty.  Next morning, symptoms did not improve and hence EMS was called and patient was brought to the ED.  On arrival, she was hypertensive up to 170/80s with normal HR and on room air. CBC showed no leukocytosis and had hemoglobin of 17.  CMP showed sodium of 132, glucose of 170, creatinine of 0.59, AST mildly elevated 63 and ALT of 68, total bilirubin of 3.1.  Lactic acid of 0.9.  Alcohol level less than 10. CT head shows grossly stable intraparenchymal hemorrhage on the left basal ganglia compared to prior exam. MRI shows multiple acute to early subacute cerebral infarct bilaterally with largest in the posterior left MCA region.  Subjective: Patient was seen and examined this morning.  Lying down in bed.  Not in distress. Continues to have word finding difficulty and episodes of  anxiety.  Husband at bedside. Patient was waiting for TEE this morning.  Underwent TEE this afternoon.  No evidence of vegetation.  Assessment/Plan: Multifocal stroke bilateral acute to subacute cerebral infarcts Chronic lacunar infarcts Recent left basal ganglia area ICH -Presented with generalized weakness, confusion. -CT scan and MRI finding as above. -Neurology consultation appreciated.  Cardioembolic stroke is the likely cause.  However, need to rule out small vessel vasculitis as well as bacterial endocarditis multifocal strokes. -TTE in last admission was negative.  TEE obtained today.  No vegetation.   -Awaiting for lab results vasculitis work-up, including lupus antibody panel, lupus anticoagulant, C3, C4, ESR, CRP, serum cryoglobulins, hepatitis panel ANCA titers, anti-Jo IgG, RPR, HIV PCR and VZV PCR  -In light of recent hemorrhage, will target for systolic blood pressure between 120 and 140. -Lipitor has been stopped because some studies suggest that it worsens outcome in patients with ICH. -PT/OT/ST evaluation. -Continue neurochecks and telemetry monitoring.  Gram-positive bacteremia -Coag negative staph aureus obtained on both aerobic and anaerobic bottles present on admission. -True infection versus contamination. -Currently on IV cefepime and IV vancomycin.  Repeat blood culture sent today. -Planned for TEE tomorrow.  Hypertension -Neurology recommends maintaining BP at 120-140 given recent [place]  -will continue home amlodipine, Labetalol and losartan.  Hydralazine as needed IV for blood pressure more than 786 systolic.  Transaminitis/hyperbilirubinemia -Suspect secondary to alcohol use. -Asymptomatic with no abdominal findings -RUQ ultrasound showed hepatic steatosis. -Liver enzymes slightly up today.  Repeat tomorrow.  Chronic alcohol use -admits to several mixed drinks and wine nightly -CIWA protocol in place. -Counseled  to quit alcohol.  Hyperlipidemia  -Hold statin per neuro recs due to potential of worsening ICH  Type 2 diabetes -Continue to hold Metformin -Moderate dose sliding scale  Body mass index is 24.47 kg/m. Mobility: PT/OT eval DVT prophylaxis:  Avoid anticoagulant because of recent ICH.  SCDs. Code Status:   Code Status: DNR per H&P Family Communication:  Patient's husband at bedside.   Expected Discharge:  TEE obtained today.  Waiting for repeat blood culture test to be negative.  PT eval obtained.  Ultimate plan is to discharge to home. Consultants:  Neurology, cardiology for TEE  Procedures:    Antimicrobials: Anti-infectives (From admission, onward)   Start     Dose/Rate Route Frequency Ordered Stop   03/11/19 2330  vancomycin (VANCOCIN) IVPB 750 mg/150 ml premix     750 mg 150 mL/hr over 60 Minutes Intravenous Every 12 hours 03/11/19 2316     03/11/19 2330  ceFEPIme (MAXIPIME) 2 g in sodium chloride 0.9 % 100 mL IVPB     2 g 200 mL/hr over 30 Minutes Intravenous Every 8 hours 03/11/19 2316        Diet Order            Diet Heart Room service appropriate? Yes; Fluid consistency: Thin  Diet effective now              Infusions:  . sodium chloride    . ceFEPime (MAXIPIME) IV 2 g (03/14/19 0923)  . vancomycin 750 mg (03/14/19 1052)    Scheduled Meds: . amLODipine  10 mg Oral Daily  . aspirin  81 mg Oral Daily  . insulin aspart  0-15 Units Subcutaneous TID WC  . labetalol  100 mg Oral Q8H  . losartan  50 mg Oral BID    PRN meds: acetaminophen **OR** acetaminophen (TYLENOL) oral liquid 160 mg/5 mL **OR** acetaminophen, hydrALAZINE, LORazepam **OR** LORazepam, senna-docusate   Objective: Vitals:   03/14/19 1306 03/14/19 1350  BP: 129/71 138/85  Pulse: 68 66  Resp: 15 18  Temp:  98.4 F (36.9 C)  SpO2: 95% 95%    Intake/Output Summary (Last 24 hours) at 03/14/2019 1619 Last data filed at 03/14/2019 1500 Gross per 24 hour  Intake 680 ml  Output -  Net 680 ml   Filed Weights    03/14/19 1157  Weight: 73 kg   Weight change:  Body mass index is 24.47 kg/m.   Physical Exam: General exam: Appears calm and comfortable.  Skin: No rashes, lesions or ulcers. HEENT: Atraumatic, normocephalic, supple neck, no obvious bleeding Lungs: Clear to auscultation bilaterally CVS: Regular rate and rhythm, no murmur GI/Abd soft, nontender, nondistended, bowel sound present CNS: Alert, awake, oriented to place.  Has significant memory and word finding difficulty with insight.  Psychiatry: Frustrated with memory deficit Extremities: No pedal edema, no calf tenderness  Data Review: I have personally reviewed the laboratory data and studies available.  Recent Labs  Lab 03/08/19 0538 03/09/19 0428 03/11/19 1037 03/13/19 0310 03/14/19 0335  WBC 10.5 9.7 10.5 8.5 8.6  NEUTROABS  --   --  9.5*  --   --   HGB 16.0* 15.5* 17.0* 14.7 14.7  HCT 44.1 42.8 45.6 39.3 39.8  MCV 102.8* 100.9* 99.1 98.5 100.3*  PLT 179 168 215 193 192   Recent Labs  Lab 03/09/19 0428 03/11/19 1037 03/12/19 0324 03/13/19 0310 03/14/19 0335  NA 134* 132* 134* 136 136  K 3.9 3.5 3.0* 3.3* 3.7  CL 100 96* 102  104 104  CO2 22 21* 20* 22 23  GLUCOSE 129* 170* 164* 109* 109*  BUN _0 CREATININE 0.69 0.59 0.65 0.61 0.57  CALCIUM 8.7* 9.0 8.5* 8.9 9.0  MG  --   --  1.7 1.7  --    Recent Labs  Lab 03/11/19 1037 03/12/19 0324 03/13/19 0310 03/14/19 0335  AST 63* 51* 71* 61*  ALT 68* 60* 83* 90*  ALKPHOS 79 72 65 64  BILITOT 3.1* 2.5* 2.2* 2.2*  PROT 7.3 6.3* 6.4* 6.5  ALBUMIN 3.6 3.1* 3.0* 3.0*    Terrilee Croak, MD  [facility] 03/14/2019

## 2019-03-14 NOTE — Progress Notes (Signed)
SLP Cancellation Note  Patient Details Name: Belinda Banks MRN: [id number] DOB: 1955/07/29   Cancelled treatment:       Reason Eval/Treat Not Completed: Patient at procedure or test/unavailable. Pt leaving unit for TEE. Will continue efforts to provide cog/com treatment.  Carmela Rima, [place] Speech Language Pathologist Office: [phone] Pager: [phone]  Shonna Chock 03/14/2019, 11:42 AM

## 2019-03-14 NOTE — Progress Notes (Signed)
PT Cancellation Note  Patient Details Name: Belinda Banks MRN: [id number] DOB: 02/20/1956   Cancelled Treatment:    Reason Eval/Treat Not Completed: Patient at procedure or test/unavailable.  Pt at TEE.  PT to check back later as time allows.   Thanks,  Verdene Lennert, PT, DPT  Acute Rehabilitation [phone] pager [phone] office  @ [facility]: [phone]     Harvie Heck 03/14/2019, 12:47 PM

## 2019-03-14 NOTE — Progress Notes (Signed)
    [facility] has been requested to perform a transesophageal echocardiogram on [place] for stroke.  After careful review of history and examination, the risks and benefits of transesophageal echocardiogram have been explained including risks of esophageal damage, perforation (1:10,000 risk), bleeding, pharyngeal hematoma as well as other potential complications associated with conscious sedation including aspiration, arrhythmia, respiratory failure and death. Alternatives to treatment were discussed, questions were answered. Patient is willing to proceed.   Tyshon Fanning Ninfa Meeker, PA-C  03/14/2019 9:15 AM

## 2019-03-14 NOTE — Progress Notes (Signed)
STROKE TEAM PROGRESS NOTE   INTERVAL HISTORY (subjective) I reviewed patient's history of presenting illness in details with her.  She was admitted with hemorrhagic left basal ganglia infarct versus primary hemorrhage a few weeks ago and now has been admitted with embolic right MCA infarcts.  Blood cultures are pending.  TEE is scheduled to rule out endocarditis.  She gives history of snoring and feels tired during the day but has not been evaluated for sleep apnea yet  OBJECTIVE Vitals:   03/13/19 1921 03/13/19 2334 03/14/19 0430 03/14/19 0830  BP: 127/75 138/75 (!) 155/86 138/79  Pulse: 63 69 71 63  Resp: 15 16 16 18   Temp: 99 F (37.2 C) 98.2 F (36.8 C) 98.3 F (36.8 C) 98.3 F (36.8 C)  TempSrc: Oral Oral Oral Oral  SpO2: 95% 97% 96% 96%  Height:        CBC:  Recent Labs  Lab 03/11/19 1037 03/13/19 0310 03/14/19 0335  WBC 10.5 8.5 8.6  NEUTROABS 9.5*  --   --   HGB 17.0* 14.7 14.7  HCT 45.6 39.3 39.8  MCV 99.1 98.5 100.3*  PLT 215 193 [id number]    Basic Metabolic Panel:  Recent Labs  Lab 03/12/19 0324 03/13/19 0310 03/14/19 0335  NA 134* 136 136  K 3.0* 3.3* 3.7  CL 102 104 104  CO2 20* 22 23  GLUCOSE 164* 109* 109*  BUN 10 8 8   CREATININE 0.65 0.61 0.57  CALCIUM 8.5* 8.9 9.0  MG 1.7 1.7  --     Lipid Panel:     Component Value Date/Time   CHOL 191 03/06/2019 0445   TRIG 200 (H) 03/06/2019 0445   HDL 45 03/06/2019 0445   CHOLHDL 4.2 03/06/2019 0445   VLDL 40 03/06/2019 0445   LDLCALC 106 (H) 03/06/2019 0445   HgbA1c:  Lab Results  Component Value Date   HGBA1C 7.6 (H) 03/06/2019   Urine Drug Screen:     Component Value Date/Time   LABOPIA NONE DETECTED 03/06/2019 1242   COCAINSCRNUR NONE DETECTED 03/06/2019 1242   LABBENZ NONE DETECTED 03/06/2019 1242   AMPHETMU NONE DETECTED 03/06/2019 1242   THCU NONE DETECTED 03/06/2019 1242   LABBARB NONE DETECTED 03/06/2019 1242    Alcohol Level     Component Value Date/Time   ETH <10 03/11/2019 1132     IMAGING  Ct Head Wo Contrast 03/11/2019 IMPRESSION:  Grossly stable intraparenchymal hemorrhage is noted in the left basal ganglia compared to prior exam. New low density is noted involving the left posterior parietal cortex with sulcal effacement concerning for acute infarction.   Mr Brain Wo Contrast 03/11/2019 IMPRESSION:  1. Multiple acute to early subacute cerebral infarcts bilaterally, largest in the posterior left MCA region.  2. Subacute hemorrhage in the left lentiform nucleus, unchanged in size from 03/05/2019. Mild edema without significant mass effect.  3. Moderate chronic small vessel ischemic disease with multiple chronic lacunar infarcts.  4. Chronic microhemorrhages likely related to hypertension.   Mr Jeri Cos Contrast 03/12/2019 IMPRESSION:  1. No pathologic enhancement within the brain.  2. Unchanged subacute hemorrhage positioned at the posterior left lentiform nucleus without significant regional mass effect.  3. Multiple additional acute to subacute cerebral infarcts, better assessed on recent noncontrast MRI.   Dg Chest Portable 1 View 03/11/2019 IMPRESSION:  Lingular/bilateral lower lobe atelectasis.  US Abdomen Limited Ruq 03/11/2019 IMPRESSION:  Hepatic steatosis, otherwise unremarkable right upper quadrant ultrasound.   CTA H&N  03/06/19  IMPRESSION: 1. No  aneurysm, dissection or hemodynamically significant stenosis of the major cervical or intracranial arteries. 2. Unchanged size of hypertensive hemorrhage in left basal ganglia.  Transthoracic Echocardiogram  03/06/2019 IMPRESSIONS  1. Left ventricular ejection fraction, by visual estimation, is 55 to 60%. The left ventricle has normal function. There is mildly increased left ventricular hypertrophy.  2. Elevated left ventricular end-diastolic pressure.  3. Left ventricular diastolic parameters are consistent with Grade I diastolic dysfunction (impaired relaxation).  4. Global right  ventricle has normal systolic function.The right ventricular size is normal. No increase in right ventricular wall thickness.  5. Left atrial size was normal.  6. Right atrial size was normal.  7. The mitral valve is grossly normal. Trace mitral valve regurgitation.  8. The tricuspid valve is grossly normal. Tricuspid valve regurgitation is trivial.  9. The aortic valve is tricuspid. Aortic valve regurgitation is not visualized. No evidence of aortic valve sclerosis or stenosis. 10. The pulmonic valve was grossly normal. Pulmonic valve regurgitation is not visualized. 11. The inferior vena cava is normal in size with greater than 50% respiratory variability, suggesting right atrial pressure of 3 mmHg.  TEE -pending  ECG - SR rate 67 BPM. (See cardiology reading for complete details)  PHYSICAL EXAM  Pleasant middle-aged Caucasian lady not in distress. . Afebrile. Head is nontraumatic. Neck is supple without bruit.    Cardiac exam no murmur or gallop. Lungs are clear to auscultation. Distal pulses are well felt. Neurological Exam : Awake alert oriented to time place and person.  Speech is fluent with only occasional word hesitancy.  Extraocular movements are full range without nystagmus.  Right peripheral partial field loss on bedside confrontational testing.  Mild right lower facial asymmetry when she smiles.  Tongue midline.  Motor system exam symmetric upper and lower extremity strength but diminished fine finger movements on the right.  Orbits left over right upper extremity.  Mild weakness of right grip.  Mild right hip flexor ankle dorsiflexor weakness but no right lower extremity drift.  Sensation is intact bilaterally.  Deep tendon reflexes symmetric.  Plantars downgoing.  Gait not tested. NIH stroke scale 3. Premorbid modified Rankin scale 2  ASSESSMENT/PLAN [name] is a 63 y.o. female with history of HTN, DM, and recent admission 03/05/2019 for left basal ganglia hemorrhage  who presented to [facility] ED with c/o generalized weakness, nausea and slight confusion.. She did not receive IV t-PA due to [place].  Stroke:  Multiple additional acute to subacute bilateral cerebral infarcts - embolic - cardioembolic infarctions, infarctions from septic emboli or vasculitis.  Code Stroke CT Head - not ordered  CT head - Grossly stable intraparenchymal hemorrhage is noted in the left basal ganglia compared to prior exam  MRI head W&WO - Multiple acute to early subacute cerebral infarcts bilaterally, largest in the posterior left MCA region. Subacute hemorrhage in the left lentiform nucleus, unchanged in size from 03/05/2019. Moderate chronic small vessel ischemic disease with multiple chronic lacunar infarcts.  MRA head - not ordered  CTA H&N - 03/06/19 - No aneurysm, dissection or hemodynamically significant stenosis of the major cervical or intracranial arteries. Unchanged size of hypertensive hemorrhage in left basal ganglia.  CT Perfusion - not ordered  Carotid Doppler - CTA neck performed - carotid dopplers not indicated.  2D Echo - 03/06/19 - EF 55 to 60%. No cardiac source of emboli identified.   TEE - pending   Lacey Jensen Virus 2 - negative  LDL - 106 (03/06/19)  HgbA1c -  7.6 (03/06/19)  UDS - not ordered - (negative 03/06/19)  VTE prophylaxis - SCDs  No antithrombotic prior to admission, now on No antithrombotic  Therapy recommendations: Pending  Disposition:  Pending  Possible Endocarditis  Possible cardioembolic infarctions, infarctions from septic emboli or vasculitis.  TEE - pending  Vasculitis labs - pending  Maxipime and Vancomycin started 03/11/19 for possible endocarditis  Hypertension  Home BP meds: losartan, amlodipine, labetalol  Current BP meds: losartan, amlodipine, labetalol  Blood pressure somewhat high at times  - within post stroke/TIA parameters - but hx of recent BG ICH    . Long-term BP goal  normotensive  Hyperlipidemia  Home Lipid lowering medication: Lipitor 20 mg daily  LDL 106, goal < 70  Current lipid lowering medication: none (d/t elevated LFTs, consider resuming Lipitor if LFTs improve)    Diabetes  Home diabetic meds: glucophage  Current diabetic meds: SSI  HgbA1c 7.6, goal < 7.0     Elevated LFTs  Hepatitis panel -nonreactive  RUQ US - Hepatic steatosis, otherwise unremarkable right upper quadrant ultrasound.  Other Stroke Risk Factors  Advanced age  Former cigarette smoker - quit  ETOH abuse, advised to drink no more than 1 alcoholic beverage per day.  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  Now she is present with by cerebral embolic infarcts with fever raising concerns for endocarditis.  This makes me wonder whether the initial infarct was hemorrhagic rather than a primary intracerebral hemorrhage as well.  Check TEE.  Start aspirin for stroke prevention.  Mobilize out of bed.  Therapy consults.  Patient may not be a good long-term anticoagulation candidate due to recent primary brain hemorrhage hence will hold off on loop recorder at the present time but may reconsider in the future.  Patient appears to be at risk for sleep apnea and may benefit by considering possible participation in the sleep smart study and will give her information to review and decide.  Discussed with Dr. Pietro Cassis.  Greater than 50% time during this 35-minute visit was spent on counseling and coordination of care about her embolic strokes and hemorrhagic infarct and discussion about sleep apnea and answered questions. Antony Contras, MD Medical Director [facility]  Stroke Center Pager: [phone] 03/14/2019 1:38 PMTo contact Stroke Continuity provider, please refer to [email or web address] After hours, contact General Neurology

## 2019-03-14 NOTE — TOC Transition Note (Signed)
Transition of Care [facility]) - CM/SW Discharge Note   Patient Details  Name: Belinda Banks MRN: [id number] Date of Birth: Aug 19, 1955  Transition [facility] Care [facility] CM/SW Contact:  Pollie Friar, RN Phone Number: 03/14/2019, 2:26 PM   Clinical Narrative:    CM met with the patients spouse. He is interested in [facility] for the patients outpatient therapy. Orders in Epic and information on the AVS.  Spouse can provide needed supervision. Spouse can also provide transport home.     Barriers to Discharge: Continued Medical Work up   Patient Goals and CMS Choice     Choice offered to / list presented to : Spouse  Discharge Placement                       Discharge Plan and Services   Discharge Planning Services: CM Consult                                 Social Determinants of Health (SDOH) Interventions     Readmission Risk Interventions No flowsheet data found.

## 2019-03-14 NOTE — CV Procedure (Signed)
    Transesophageal Echocardiogram Note  Belinda Banks [id number] July 02, 1955  Procedure: Transesophageal Echocardiogram Indications:  CVA   Procedure Details Consent: Obtained Time Out: Verified patient identification, verified procedure, site/side was marked, verified correct patient position, special equipment/implants available, Radiology Safety Procedures followed,  medications/allergies/relevent history reviewed, required imaging and test results available.  Performed  Medications:  During this procedure the patient is administered a total of Versed 5  mg and Fentanyl  75  mcg  to achieve and maintain moderate conscious sedation.  The patient's heart rate, blood pressure, and oxygen saturation are monitored continuously during the procedure. The period of conscious sedation is  30  minutes, of which I was present face-to-face 100% of this time.  Left Ventrical:  Normal LV function.    Mitral Valve: trace - mild MR   Aortic Valve: normal 3 leaflet valve   Tricuspid Valve: normal .  Trivial TR   Pulmonic Valve: normal   Left Atrium/ Left atrial appendage: no thrombi   Atrial septum: in evidence fo PFO or ASD by bubble study or color doppler   Aorta: trivial aortic plaque   Complications: No apparent complications Patient did tolerate procedure well.   [name], Brooke Bonito., MD, [facility] 03/14/2019, 12:37 PM

## 2019-03-15 LAB — CBC
HCT: 40.7 % (ref 36.0–46.0)
Hemoglobin: 15.2 g/dL — ABNORMAL HIGH (ref 12.0–15.0)
MCH: 36.8 pg — ABNORMAL HIGH (ref 26.0–34.0)
MCHC: 37.3 g/dL — ABNORMAL HIGH (ref 30.0–36.0)
MCV: 98.5 fL (ref 80.0–100.0)
Platelets: 199 10*3/uL (ref 150–400)
RBC: 4.13 MIL/uL (ref 3.87–5.11)
RDW: 11.4 % — ABNORMAL LOW (ref 11.5–15.5)
WBC: 10.1 10*3/uL (ref 4.0–10.5)
nRBC: 0 % (ref 0.0–0.2)

## 2019-03-15 LAB — COMPREHENSIVE METABOLIC PANEL
ALT: 87 U/L — ABNORMAL HIGH (ref 0–44)
AST: 51 U/L — ABNORMAL HIGH (ref 15–41)
Albumin: 3 g/dL — ABNORMAL LOW (ref 3.5–5.0)
Alkaline Phosphatase: 71 U/L (ref 38–126)
Anion gap: 10 (ref 5–15)
BUN: 9 mg/dL (ref 8–23)
CO2: 23 mmol/L (ref 22–32)
Calcium: 9.1 mg/dL (ref 8.9–10.3)
Chloride: 102 mmol/L (ref 98–111)
Creatinine, Ser: 0.59 mg/dL (ref 0.44–1.00)
GFR calc Af Amer: >60 mL/min (ref >60)
GFR calc non Af Amer: >60 mL/min (ref >60)
Glucose, Bld: 118 mg/dL — ABNORMAL HIGH (ref 70–99)
Potassium: 3.6 mmol/L (ref 3.5–5.1)
Sodium: 135 mmol/L (ref 135–145)
Total Bilirubin: 1.9 mg/dL — ABNORMAL HIGH (ref 0.3–1.2)
Total Protein: 6.6 g/dL (ref 6.5–8.1)

## 2019-03-15 LAB — GLUCOSE, CAPILLARY
Glucose-Capillary: 124 mg/dL — ABNORMAL HIGH (ref 70–99)
Glucose-Capillary: 227 mg/dL — ABNORMAL HIGH (ref 70–99)

## 2019-03-15 MED ORDER — ASPIRIN 81 MG PO CHEW: 81.0000 mg | CHEWABLE_TABLET | Freq: Every day | ORAL | 0 refills | Status: DC

## 2019-03-15 NOTE — TOC Transition Note (Signed)
Transition of Care [facility]) - CM/SW Discharge Note   Patient Details  Name: Belinda Banks MRN: [id number] Date of Birth: [birth year]  Transition of Care [facility]) CM/SW Contact:  Pollie Friar, RN Phone Number: 03/15/2019, 10:33 AM   Clinical Narrative:    Pt has new PCP appt. Outpatient rehab arranged.  Pt has transportation home.   Final next level of care: OP Rehab Barriers to Discharge: No Barriers Identified   Patient Goals and CMS Choice     Choice offered to / list presented to : Spouse  Discharge Placement                       Discharge Plan and Services   Discharge Planning Services: CM Consult                                 Social Determinants of Health (SDOH) Interventions     Readmission Risk Interventions No flowsheet data found.

## 2019-03-15 NOTE — Discharge Summary (Addendum)
Physician Discharge Summary  Belinda Banks DIY:[id number] DOB: 31-Jul-1955 DOA: 03/11/2019  PCP: Patient, No Pcp Per  Admit date: 03/11/2019 Discharge date: 03/15/2019  Admitted From: Home Discharge disposition: Home with outpatient occupational therapy   Code Status: DNR  Diet Recommendation: Cardiac/medium calorie controlled diet.   Recommendations for Outpatient Follow-Up:   1. Follow-up with PCP as an outpatient 2. Follow-up with outpatient outpatient therapy. 3. Follow-up with neurology as an outpatient.  Discharge Diagnosis:   Principal Problem:   CVA (cerebral vascular accident) ([place]) Active Problems:   ICH (intracerebral hemorrhage) ([place]) - Hypertensive L Basal Ganglia ICH   DM (diabetes mellitus) ([place])   HTN (hypertension)   Alcohol use   Hyperlipidemia  History of Present Illness / Brief narrative:  KASSAUNDRA HAIR is a 63 y.o. female with medical history significant of recent ICH, hypertension, Type 2 diabetes, hyperlipidemia. Patient presented to the ED on 03/11/2019 with increasing confusion and aphasia following a recent admission for ICH.  Patient was hospitalized from 11/14-11/18 for hypertensive emergency and hemorrhage in left basal ganglia requiring ICU admission and aggressive blood pressure control with Cleviprex.  She has right-sided droop and decreased grip strength due to the stroke.  She was discharged home with amlodipine, increased Cozarr and started on low dose Labetalol.  She was not discharged on aspirin because of hemorrhage.  Reportedly patient was compliant with medication.  On 11/19, patient was lethargic and stayed in bed all day.  In the night, she started to have increasing confusion and trouble word-finding.She also had vision changes and was unable to characterize because of word finding difficulty.  Next morning, symptoms did not improve and hence EMS was called and patient was brought to the ED.   On arrival, she was hypertensive up to  170/80s with normal HR and on room air. CBC showed no leukocytosis and had hemoglobin of 17.  CMP showed sodium of 132, glucose of 170, creatinine of 0.59, AST mildly elevated 63 and ALT of 68, total bilirubin of 3.1.  Lactic acid of 0.9.  Alcohol level less than 10. CT head shows grossly stable intraparenchymal hemorrhage on the left basal ganglia compared to prior exam. MRI shows multiple acute to early subacute cerebral infarct bilaterally with largest in the posterior left MCA region.  Hospital Course:  Multifocal stroke bilateral acute to subacute cerebral infarcts Chronic lacunar infarcts Recent left basal ganglia area ICH -Presented with generalized weakness, confusion. -CT scan and MRI finding as above. -Neurology consultation appreciated.  Cardioembolic stroke is the likely cause.  However, need to rule out small vessel vasculitis as well as bacterial endocarditis multifocal strokes. -TTE in last admission was negative.  TEE obtained on 11/23.  No vegetation.   -Labs sent for vasculitis work-up, including lupus antibody panel, lupus anticoagulant, C3, C4, ESR, CRP, serum cryoglobulins, hepatitis panel ANCA titers, anti-Jo IgG, RPR, HIV PCR and VZV PCR -pending report.  Patient will follow-up with neurology as an outpatient. -Lipitor has been stopped because per neurology, some studies suggest that it worsens outcome in patients with ICH. -Aspirin 81 mg daily started per neurology recommendation.. -PT/OT/ST evaluation obtained.  Outpatient Occupational Therapy advised.   Gram-positive bacteremia -Coag negative staph aureus obtained on both aerobic and anaerobic bottles present on admission. -Kept on broad-spectrum antibiotics.  Repeat blood culture did not show any growth. -Negative TEE for vegetations.   Hypertension - continue home amlodipine, Labetalol and losartan.     Transaminitis/hyperbilirubinemia -Suspect secondary to alcohol use. -Asymptomatic with no  abdominal  findings -RUQ ultrasound showed hepatic steatosis. -Liver enzymes trending down.   Chronic alcohol use -admits to several mixed drinks and wine nightly -Did not have withdrawal symptoms in the hospital. -Counseled to quit alcohol.   Hyperlipidemia -statin kept on hold per neuro recs due to potential of worsening ICH   Type 2 diabetes -Resume metformin post discharge.  Stable for discharge home today.  Subjective:  Seen and examined this morning.  Pleasant middle-aged Caucasian female.  Lying down in bed.  Not in distress.  No new symptoms.  Continues to have fragmentation of her vision.  Will follow up with outpatient therapy as an outpatient.  Discharge Exam:   Vitals:   03/14/19 1925 03/14/19 2334 03/15/19 0350 03/15/19 0823  BP: (!) 149/87 125/66 (!) 154/89 (!) 151/96  Pulse: 65 63 62 63  Resp: _0 Temp: 98.4 F (36.9 C) 98.7 F (37.1 C) 98.1 F (36.7 C) 98.8 F (37.1 C)  TempSrc: Oral Oral Oral Oral  SpO2: 94% 97% 98% 99%  Weight:      Height:        Body mass index is 24.47 kg/m.  General exam: Appears calm and comfortable.  Skin: No rashes, lesions or ulcers. HEENT: Atraumatic, normocephalic, supple neck, no obvious bleeding Lungs: Clear to auscultation bilaterally CVS: Regular rate and rhythm, no murmur GI/Abd soft, nontender, nondistended, bowel sound present CNS: Alert, awake, oriented x3, Psychiatry: Mood appropriate Extremities: No pedal edema, no calf tenderness  Discharge Instructions:  Wound care: None Discharge Instructions    Ambulatory referral to Occupational Therapy   Complete by: As directed    Ambulatory referral to Speech Therapy   Complete by: As directed    Diet - low sodium heart healthy   Complete by: As directed    Diet Carb Modified   Complete by: As directed    Increase activity slowly   Complete by: As directed      Follow-up Information    [facility].    Specialty: Rehabilitation Why: The outpatient rehab will contact you for the first appointment Contact information: [address] [place]       Garvin Fila, MD Follow up.   Specialties: Neurology, Radiology Contact information: [address] [place] [address] [phone]          Allergies as of 03/15/2019      Reactions   Codeine Other (See Comments)   Made her loopy      Medication List    STOP taking these medications   atorvastatin 20 MG tablet Commonly known as: Lipitor     TAKE these medications   amLODipine 10 MG tablet Commonly known as: NORVASC Take 1 tablet (10 mg total) by mouth daily.   aspirin 81 MG chewable tablet Chew 1 tablet (81 mg total) by mouth daily. Start taking on: March 16, 2019   blood glucose meter kit and supplies Dispense based on patient and insurance preference. Use up to four times daily as directed. (FOR ICD-10 E10.9, E11.9).   DRY EYES OP Place 1 drop into both eyes daily as needed (dry eyes).   labetalol 100 MG tablet Commonly known as: NORMODYNE Take 1 tablet (100 mg total) by mouth 3 (three) times daily.   losartan 50 MG tablet Commonly known as: COZAAR Take 1 tablet (50 mg total) by mouth 2 (two) times daily.   metFORMIN 500 MG tablet  Commonly known as: GLUCOPHAGE Take 1 tablet (500 mg total) by mouth 2 (two) times daily with a meal.       Time coordinating discharge: 35 minutes  The results of significant diagnostics from this hospitalization (including imaging, microbiology, ancillary and laboratory) are listed below for reference.    Procedures and Diagnostic Studies:   Ct Head Wo Contrast  Result Date: 03/11/2019 CLINICAL DATA:  Altered mental status. EXAM: CT HEAD WITHOUT CONTRAST TECHNIQUE: Contiguous axial images were obtained from the base of the skull through the vertex without intravenous contrast. COMPARISON:   March 05, 2019. FINDINGS: Brain: 29 x 22 x 12 mm intraparenchymal hemorrhage is noted in the left basal ganglia which is not significantly changed compared to prior exam. Ventricular size is within normal limits. No midline shift is noted. New low density is seen involving the left posterior parietal cortex with some degree of sulcal effacement suggesting acute infarction. Vascular: No hyperdense vessel or unexpected calcification. Skull: Normal. Negative for fracture or focal lesion. Sinuses/Orbits: No acute finding. Other: None. IMPRESSION: Grossly stable intraparenchymal hemorrhage is noted in the left basal ganglia compared to prior exam. New low density is noted involving the left posterior parietal cortex with sulcal effacement concerning for acute infarction. Electronically Signed   By: Marijo Conception M.D.   On: 03/11/2019 11:43   Mr Brain Wo Contrast  Result Date: 03/11/2019 CLINICAL DATA:  Stroke follow-up. Generalized weakness, nausea, and confusion. Recent left basal ganglia hemorrhage. EXAM: MRI HEAD WITHOUT CONTRAST TECHNIQUE: Multiplanar, multiecho pulse sequences of the brain and surrounding structures were obtained without intravenous contrast. COMPARISON:  Head CT 03/11/2019 FINDINGS: Brain: There is a small to moderate-sized acute to early subacute posterior left MCA infarct involving the parietal lobe and lateral occipital lobe. Additional small acute to early subacute infarcts are noted in the left insula and operculum, right occipital lobe, right parietal lobe, and bilateral frontal lobes. A subacute hemorrhage in the posterior left lentiform nucleus measures 2.0 x 1.4 cm, unchanged from 03/05/2019 and with mild surrounding edema again noted without significant mass effect. Scattered chronic microhemorrhages are noted in both cerebral hemispheres, cerebellum, and pons likely reflecting chronic hypertension. Chronic lacunar infarcts are noted in the right basal ganglia, cerebral white  matter, pons, and cerebellum. Additional T2 hyperintensities in the cerebral white matter and pons are nonspecific but compatible with moderate chronic small vessel ischemic disease. There is no midline shift or extra-axial fluid collection. The ventricles and sulci are within normal limits for age. Vascular: Major intracranial vascular flow voids are preserved. Skull and upper cervical spine: Unremarkable bone marrow signal. Sinuses/Orbits: Bilateral cataract extraction. Minimal right maxillary sinus mucosal thickening. Clear mastoid air cells. Other: None. IMPRESSION: 1. Multiple acute to early subacute cerebral infarcts bilaterally, largest in the posterior left MCA region. 2. Subacute hemorrhage in the left lentiform nucleus, unchanged in size from 03/05/2019. Mild edema without significant mass effect. 3. Moderate chronic small vessel ischemic disease with multiple chronic lacunar infarcts. 4. Chronic microhemorrhages likely related to hypertension. Electronically Signed   By: Logan Bores M.D.   On: 03/11/2019 17:42   Mr Brain W Contrast  Result Date: 03/12/2019 CLINICAL DATA:  Initial evaluation for possible CNS vasculitis. EXAM: MRI HEAD WITH CONTRAST TECHNIQUE: Multiplanar, multiecho pulse sequences of the brain and surrounding structures were obtained with intravenous contrast. CONTRAST:  33m GADAVIST GADOBUTROL 1 MMOL/ML IV SOLN COMPARISON:  Previous noncontrast brain MRI from 03/11/2019 as well as earlier studies. FINDINGS: Brain: Subacute intraparenchymal hemorrhage centered  at the posterior left lentiform nucleus again seen, stable in size and morphology measuring 2.2 x 1.4 cm. Gyral swelling and edema related to the posterior left MCA distribution infarct noted as well, also unchanged. No associated significant pathologic enhancement seen within the brain to suggest CNS vasculitis or other abnormality. Faint leptomeningeal enhancement seen about the posterior left MCA distribution infarct, within  normal limits for normal expected reactive changes. Age-related cerebral atrophy with a few scattered remote lacunar infarcts again noted. No mass lesion, midline shift, or mass effect. No hydrocephalus. No extra-axial fluid collection. Pituitary gland suprasellar region normal. Midline structures intact. Vascular: Normal enhancement seen throughout the intracranial vascular structures. Skull and upper cervical spine: Craniocervical junction within normal limits. Upper cervical spine normal. Bone marrow signal intensity within normal limits. No scalp soft tissue abnormality. Sinuses/Orbits: Globes and orbital soft tissues within normal limits. Mild mucosal thickening noted within the right maxillary sinus. Paranasal sinuses are otherwise largely clear. No significant mastoid effusion. Other: None. IMPRESSION: 1. No pathologic enhancement within the brain. 2. Unchanged subacute hemorrhage positioned at the posterior left lentiform nucleus without significant regional mass effect. 3. Multiple additional acute to subacute cerebral infarcts, better assessed on recent noncontrast MRI. Electronically Signed   By: Jeannine Boga M.D.   On: 03/12/2019 00:29   Dg Chest Portable 1 View  Result Date: 03/11/2019 CLINICAL DATA:  Weakness, nausea, confusion EXAM: PORTABLE CHEST 1 VIEW COMPARISON:  08/21/2004 FINDINGS: Mild lingular/bilateral lower lobe opacities, likely atelectasis. No pleural effusion or pneumothorax. Cardiomegaly. IMPRESSION: Lingular/bilateral lower lobe atelectasis. Electronically Signed   By: Julian Hy M.D.   On: 03/11/2019 10:40   US Abdomen Limited Ruq  Result Date: 03/11/2019 CLINICAL DATA:  Increasing bilirubin EXAM: ULTRASOUND ABDOMEN LIMITED RIGHT UPPER QUADRANT COMPARISON:  None. FINDINGS: Gallbladder: No gallstones or wall thickening visualized. No sonographic Murphy sign noted by sonographer. Common bile duct: Diameter: 2.5 mm proximally, 3.6 mm distally.  Nondilated. Liver:  Diffusely increased hepatic echogenicity with loss of definition of the portal triads and diminished posterior through transmission compatible with hepatic steatosis. No focal liver lesions. Portal vein is patent on color Doppler imaging with normal direction of blood flow towards the liver. Other: None. IMPRESSION: Hepatic steatosis, otherwise unremarkable right upper quadrant ultrasound. Electronically Signed   By: Lovena Le M.D.   On: 03/11/2019 20:39     Labs:   Basic Metabolic Panel: Recent Labs  Lab 03/11/19 1037 03/12/19 0324 03/13/19 0310 03/14/19 0335 03/15/19 0338  NA 132* 134* 136 136 135  K 3.5 3.0* 3.3* 3.7 3.6  CL 96* 102 104 104 102  CO2 21* 20* '22 23 23  '$ GLUCOSE 170* 164* 109* 109* 118*  BUN '13 10 8 8 9  '$ CREATININE 0.59 0.65 0.61 0.57 0.59  CALCIUM 9.0 8.5* 8.9 9.0 9.1  MG  --  1.7 1.7  --   --    GFR Estimated Creatinine Clearance: 73.6 mL/min (by C-G formula based on SCr of 0.59 mg/dL). Liver Function Tests: Recent Labs  Lab 03/11/19 1037 03/12/19 0324 03/13/19 0310 03/14/19 0335 03/15/19 0338  AST 63* 51* 71* 61* 51*  ALT 68* 60* 83* 90* 87*  ALKPHOS 79 72 65 64 71  BILITOT 3.1* 2.5* 2.2* 2.2* 1.9*  PROT 7.3 6.3* 6.4* 6.5 6.6  ALBUMIN 3.6 3.1* 3.0* 3.0* 3.0*   No results for input(s): LIPASE, AMYLASE in the last 168 hours. Recent Labs  Lab 03/11/19 1132  AMMONIA 20   Coagulation profile No results for input(s):  INR, PROTIME in the last 168 hours.  CBC: Recent Labs  Lab 03/09/19 0428 03/11/19 1037 03/13/19 0310 03/14/19 0335 03/15/19 0338  WBC 9.7 10.5 8.5 8.6 10.1  NEUTROABS  --  9.5*  --   --   --   HGB 15.5* 17.0* 14.7 14.7 15.2*  HCT 42.8 45.6 39.3 39.8 40.7  MCV 100.9* 99.1 98.5 100.3* 98.5  PLT 168 215 193 192 199   Cardiac Enzymes: No results for input(s): CKTOTAL, CKMB, CKMBINDEX, TROPONINI in the last 168 hours. BNP: Invalid input(s): POCBNP CBG: Recent Labs  Lab 03/14/19 0612 03/14/19 1118 03/14/19 1657  03/14/19 2111 03/15/19 0626  GLUCAP 125* 129* 226* 165* 124*   D-Dimer No results for input(s): DDIMER in the last 72 hours. Hgb A1c No results for input(s): HGBA1C in the last 72 hours. Lipid Profile No results for input(s): CHOL, HDL, LDLCALC, TRIG, CHOLHDL, LDLDIRECT in the last 72 hours. Thyroid function studies No results for input(s): TSH, T4TOTAL, T3FREE, THYROIDAB in the last 72 hours.  Invalid input(s): FREET3 Anemia work up No results for input(s): VITAMINB12, FOLATE, FERRITIN, TIBC, IRON, RETICCTPCT in the last 72 hours. Microbiology Recent Results (from the past 240 hour(s))  SARS CORONAVIRUS 2 (TAT 6-24 HRS) Nasopharyngeal Nasopharyngeal Swab     Status: None   Collection Time: 03/05/19  7:00 PM   Specimen: Nasopharyngeal Swab  Result Value Ref Range Status   SARS Coronavirus 2 NEGATIVE NEGATIVE Final    Comment: (NOTE) SARS-CoV-2 target nucleic acids are NOT DETECTED. The SARS-CoV-2 RNA is generally detectable in upper and lower respiratory specimens during the acute phase of infection. Negative results do not preclude SARS-CoV-2 infection, do not rule out co-infections with other pathogens, and should not be used as the sole basis for treatment or other patient management decisions. Negative results must be combined with clinical observations, patient history, and epidemiological information. The expected result is Negative. Fact Sheet for Patients: SugarRoll.be Fact Sheet for Healthcare Providers: https://www.woods-mathews.com/ This test is not yet approved or cleared by the [place] FDA and  has been authorized for detection and/or diagnosis of SARS-CoV-2 by FDA under an Emergency Use Authorization (EUA). This EUA will remain  in effect (meaning this test can be used) for the duration of the COVID-19 declaration under Section 56 4(b)(1) of the Act, 21 U.S.C. section 360bbb-3(b)(1), unless the authorization is  terminated or revoked sooner. Performed at [facility], [place] [address], [place], [place] [address]   MRSA PCR Screening     Status: None   Collection Time: 03/05/19  8:09 PM   Specimen: Nasal Mucosa; Nasopharyngeal  Result Value Ref Range Status   MRSA by PCR NEGATIVE NEGATIVE Final    Comment:        The GeneXpert MRSA Assay (FDA approved for NASAL specimens only), is one component of a comprehensive MRSA colonization surveillance program. It is not intended to diagnose MRSA infection nor to guide or monitor treatment for MRSA infections. Performed at [facility], [place] [address] [place], [place] [address]   Urine culture     Status: None   Collection Time: 03/11/19  2:42 PM   Specimen: Urine, Random  Result Value Ref Range Status   Specimen Description URINE, RANDOM  Final   Special Requests NONE  Final   Culture   Final    NO GROWTH Performed at [facility], [place] [address] [place], [place] [address]    Report Status 03/13/2019 FINAL  Final  Culture, blood (routine x 2)     Status: None (Preliminary result)   Collection Time: 03/11/19 10:56 PM   Specimen: BLOOD RIGHT HAND  Result Value Ref Range Status   Specimen Description BLOOD RIGHT HAND  Final   Special Requests   Final    BOTTLES DRAWN AEROBIC AND ANAEROBIC Blood Culture results may not be optimal due to an inadequate volume of blood received in culture bottles   Culture   Final    NO GROWTH 3 DAYS Performed at [facility], [place] [address] [place], [place] [address]    Report Status PENDING  Incomplete  Culture, blood (routine x 2)     Status: Abnormal   Collection Time: 03/11/19 10:56 PM   Specimen: BLOOD  Result Value Ref Range Status   Specimen Description BLOOD LEFT ANTECUBITAL  Final   Special Requests   Final    BOTTLES DRAWN AEROBIC AND ANAEROBIC Blood Culture adequate volume   Culture  Setup Time   Final    GRAM POSITIVE COCCI IN BOTH AEROBIC AND ANAEROBIC  BOTTLES CRITICAL RESULT CALLED TO, READ BACK BY AND VERIFIED WITH: PHARMD T BAUMEISTER [id number] AT 1448 BY CM    Culture (A)  Final    STAPHYLOCOCCUS SPECIES (COAGULASE NEGATIVE) THE SIGNIFICANCE OF ISOLATING THIS ORGANISM FROM A SINGLE SET OF BLOOD CULTURES WHEN MULTIPLE SETS ARE DRAWN IS UNCERTAIN. PLEASE NOTIFY THE MICROBIOLOGY DEPARTMENT WITHIN ONE WEEK IF SPECIATION AND SENSITIVITIES ARE REQUIRED. Performed at [facility], [place] [address] [place], [place] [address]    Report Status 03/14/2019 FINAL  Final  Culture, blood (routine x 2)     Status: None (Preliminary result)   Collection Time: 03/13/19  9:14 AM   Specimen: BLOOD  Result Value Ref Range Status   Specimen Description BLOOD RIGHT ANTECUBITAL  Final   Special Requests   Final    BOTTLES DRAWN AEROBIC AND ANAEROBIC Blood Culture adequate volume   Culture   Final    NO GROWTH < 24 HOURS Performed at [facility], [place] [address] [place], [place] [address]    Report Status PENDING  Incomplete  Culture, blood (routine x 2)     Status: None (Preliminary result)   Collection Time: 03/13/19  9:15 AM   Specimen: BLOOD LEFT HAND  Result Value Ref Range Status   Specimen Description BLOOD LEFT HAND  Final   Special Requests   Final    BOTTLES DRAWN AEROBIC AND ANAEROBIC Blood Culture results may not be optimal due to an excessive volume of blood received in culture bottles   Culture   Final    NO GROWTH < 24 HOURS Performed at [facility], [place] [address] [place], [place] [address]    Report Status PENDING  Incomplete    Please note: You were cared for by a hospitalist during your hospital stay. Once you are discharged, your primary care physician will handle any further medical issues. Please note that NO REFILLS for any discharge medications will be authorized once you are discharged, as it is imperative that you return to your primary care physician (or establish a relationship with a primary care  physician if you do not have one) for your post hospital discharge needs so that they can reassess your need for medications and monitor your lab values.  Signed: Terrilee Croak  [facility] 03/15/2019, 10:38 AM

## 2019-03-15 NOTE — Progress Notes (Signed)
STROKE TEAM PROGRESS NOTE   INTERVAL HISTORY (subjective) Patient had TEE which did not show any vegetations or cardiac source of embolism.  She decided not to participate in the sleep smart trial.  She is awaiting discharge home today.  No new complaints.  Vital signs stable.  OBJECTIVE Vitals:   03/14/19 1925 03/14/19 2334 03/15/19 0350 03/15/19 0823  BP: (!) 149/87 125/66 (!) 154/89 (!) 151/96  Pulse: 65 63 62 63  Resp: 16 16 16 16   Temp: 98.4 F (36.9 C) 98.7 F (37.1 C) 98.1 F (36.7 C) 98.8 F (37.1 C)  TempSrc: Oral Oral Oral Oral  SpO2: 94% 97% 98% 99%  Weight:      Height:        CBC:  Recent Labs  Lab 03/11/19 1037  03/14/19 0335 03/15/19 0338  WBC 10.5   < > 8.6 10.1  NEUTROABS 9.5*  --   --   --   HGB 17.0*   < > 14.7 15.2*  HCT 45.6   < > 39.8 40.7  MCV 99.1   < > 100.3* 98.5  PLT 215   < > 192 199   < > = values in this interval not displayed.    Basic Metabolic Panel:  Recent Labs  Lab 03/12/19 0324 03/13/19 0310 03/14/19 0335 03/15/19 0338  NA 134* 136 136 135  K 3.0* 3.3* 3.7 3.6  CL 102 104 104 102  CO2 20* 22 23 23   GLUCOSE 164* 109* 109* 118*  BUN 10 8 8 9   CREATININE 0.65 0.61 0.57 0.59  CALCIUM 8.5* 8.9 9.0 9.1  MG 1.7 1.7  --   --     Lipid Panel:     Component Value Date/Time   CHOL 191 03/06/2019 0445   TRIG 200 (H) 03/06/2019 0445   HDL 45 03/06/2019 0445   CHOLHDL 4.2 03/06/2019 0445   VLDL 40 03/06/2019 0445   LDLCALC 106 (H) 03/06/2019 0445   HgbA1c:  Lab Results  Component Value Date   HGBA1C 7.6 (H) 03/06/2019   Urine Drug Screen:     Component Value Date/Time   LABOPIA NONE DETECTED 03/06/2019 1242   COCAINSCRNUR NONE DETECTED 03/06/2019 1242   LABBENZ NONE DETECTED 03/06/2019 1242   AMPHETMU NONE DETECTED 03/06/2019 1242   THCU NONE DETECTED 03/06/2019 1242   LABBARB NONE DETECTED 03/06/2019 1242    Alcohol Level     Component Value Date/Time   ETH <10 03/11/2019 1132    IMAGING  Ct Head Wo  Contrast 03/11/2019 IMPRESSION:  Grossly stable intraparenchymal hemorrhage is noted in the left basal ganglia compared to prior exam. New low density is noted involving the left posterior parietal cortex with sulcal effacement concerning for acute infarction.   Mr Brain Wo Contrast 03/11/2019 IMPRESSION:  1. Multiple acute to early subacute cerebral infarcts bilaterally, largest in the posterior left MCA region.  2. Subacute hemorrhage in the left lentiform nucleus, unchanged in size from 03/05/2019. Mild edema without significant mass effect.  3. Moderate chronic small vessel ischemic disease with multiple chronic lacunar infarcts.  4. Chronic microhemorrhages likely related to hypertension.   Mr Jeri Cos Contrast 03/12/2019 IMPRESSION:  1. No pathologic enhancement within the brain.  2. Unchanged subacute hemorrhage positioned at the posterior left lentiform nucleus without significant regional mass effect.  3. Multiple additional acute to subacute cerebral infarcts, better assessed on recent noncontrast MRI.   Dg Chest Portable 1 View 03/11/2019 IMPRESSION:  Lingular/bilateral lower lobe atelectasis.  US  Abdomen Limited Ruq 03/11/2019 IMPRESSION:  Hepatic steatosis, otherwise unremarkable right upper quadrant ultrasound.   CTA H&N  03/06/19  IMPRESSION: 1. No aneurysm, dissection or hemodynamically significant stenosis of the major cervical or intracranial arteries. 2. Unchanged size of hypertensive hemorrhage in left basal ganglia.  Transthoracic Echocardiogram  03/06/2019 IMPRESSIONS  1. Left ventricular ejection fraction, by visual estimation, is 55 to 60%. The left ventricle has normal function. There is mildly increased left ventricular hypertrophy.  2. Elevated left ventricular end-diastolic pressure.  3. Left ventricular diastolic parameters are consistent with Grade I diastolic dysfunction (impaired relaxation).  4. Global right ventricle has normal systolic  function.The right ventricular size is normal. No increase in right ventricular wall thickness.  5. Left atrial size was normal.  6. Right atrial size was normal.  7. The mitral valve is grossly normal. Trace mitral valve regurgitation.  8. The tricuspid valve is grossly normal. Tricuspid valve regurgitation is trivial.  9. The aortic valve is tricuspid. Aortic valve regurgitation is not visualized. No evidence of aortic valve sclerosis or stenosis. 10. The pulmonic valve was grossly normal. Pulmonic valve regurgitation is not visualized. 11. The inferior vena cava is normal in size with greater than 50% respiratory variability, suggesting right atrial pressure of 3 mmHg.  TEE -no vegetations or cardiac source of embolism.  ECG - SR rate 67 BPM. (See cardiology reading for complete details)  PHYSICAL EXAM  Pleasant middle-aged Caucasian lady not in distress. . Afebrile. Head is nontraumatic. Neck is supple without bruit.    Cardiac exam no murmur or gallop. Lungs are clear to auscultation. Distal pulses are well felt. Neurological Exam : Awake alert oriented to time place and person.  Speech is fluent with only occasional word hesitancy.  Extraocular movements are full range without nystagmus.  Right peripheral partial field loss on bedside confrontational testing.  Mild right lower facial asymmetry when she smiles.  Tongue midline.  Motor system exam symmetric upper and lower extremity strength but diminished fine finger movements on the right.  Orbits left over right upper extremity.  Mild weakness of right grip.  Mild right hip flexor ankle dorsiflexor weakness but no right lower extremity drift.  Sensation is intact bilaterally.  Deep tendon reflexes symmetric.  Plantars downgoing.  Gait not tested. NIH stroke scale 3. Premorbid modified Rankin scale 2  ASSESSMENT/PLAN Ms. ARNEL HAMBLET is a 63 y.o. female with history of HTN, DM, and recent admission 03/05/2019 for left basal ganglia  hemorrhage who presented to [facility] ED with c/o generalized weakness, nausea and slight confusion.. She did not receive IV t-PA due to [place].  Stroke:  Multiple additional acute to subacute bilateral cerebral infarcts - embolic - cardioembolic infarctions, infarctions from septic emboli or vasculitis.  Code Stroke CT Head - not ordered  CT head - Grossly stable intraparenchymal hemorrhage is noted in the left basal ganglia compared to prior exam  MRI head W&WO - Multiple acute to early subacute cerebral infarcts bilaterally, largest in the posterior left MCA region. Subacute hemorrhage in the left lentiform nucleus, unchanged in size from 03/05/2019. Moderate chronic small vessel ischemic disease with multiple chronic lacunar infarcts.  MRA head - not ordered  CTA H&N - 03/06/19 - No aneurysm, dissection or hemodynamically significant stenosis of the major cervical or intracranial arteries. Unchanged size of hypertensive hemorrhage in left basal ganglia.  CT Perfusion - not ordered  Carotid Doppler - CTA neck performed - carotid dopplers not indicated.  2D Echo - 03/06/19 -  EF 55 to 60%. No cardiac source of emboli identified.   TEE - no vegetations. Clot or PFO  [employer] Virus 2 - negative  LDL - 106 (03/06/19)  HgbA1c - 7.6 (03/06/19)  UDS - not ordered - (negative 03/06/19)  VTE prophylaxis - SCDs  No antithrombotic prior to admission, now on aspirin 81 mg dailyTherapy recommendations: Pending  Disposition:  Pending  Possible Endocarditis  Possible cardioembolic infarctions, infarctions from septic emboli or vasculitis.  TEE - pending  Vasculitis labs - pending  Maxipime and Vancomycin started 03/11/19 for possible endocarditis  Hypertension  Home BP meds: losartan, amlodipine, labetalol  Current BP meds: losartan, amlodipine, labetalol  Blood pressure somewhat high at times  - within post stroke/TIA parameters - but hx of recent BG ICH    . Long-term BP goal  normotensive  Hyperlipidemia  Home Lipid lowering medication: Lipitor 20 mg daily  LDL 106, goal < 70  Current lipid lowering medication: none (d/t elevated LFTs, consider resuming Lipitor if LFTs improve)    Diabetes  Home diabetic meds: glucophage  Current diabetic meds: SSI  HgbA1c 7.6, goal < 7.0     Elevated LFTs  Hepatitis panel -nonreactive  RUQ US - Hepatic steatosis, otherwise unremarkable right upper quadrant ultrasound.  Other Stroke Risk Factors  Advanced age  Former cigarette smoker - quit  ETOH abuse, advised to drink no more than 1 alcoholic beverage per day.  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  Now she has presented with by cerebral embolic infarcts with fever raising concerns for endocarditis.  This makes me wonder whether the initial infarct was hemorrhagic rather than a primary intracerebral hemorrhage as well.     Start aspirin 81 mg dailyfor stroke prevention.   Patient may not be a good long-term anticoagulation candidate due to recent primary brain hemorrhage hence will hold off on loop recorder at the present time but may reconsider in the future.   Follow up as outpatient in stroke clinic in 6 weeks. D/w Dr Luna Kitchens, MD Medical Director [place] Pager: [phone] 03/15/2019 11:02 AMTo contact Stroke Continuity provider, please refer to [email or web address] After hours, contact General Neurology

## 2019-03-15 NOTE — Progress Notes (Signed)
Occupational Therapy Treatment Patient Details Name: Belinda Banks MRN: [id number] DOB: 01/13/1956 Today's Date: 03/15/2019    History of present illness Belinda Banks is a 63 y.o. female with medical history significant of recent [place], hypertension, Type 2 diabetes, hyperlipidemia. Patient presented to the ED on11/20/2020 with increasing confusion and aphasia following a recent admission for [facility] 11/14-11/18. MRI shows multiple acute to early subacute cerebral infarct bilaterally with largest in the posterior left MCA region.   OT comments  Pt progressing towards established OT goals. Focused session on cognitive and visual deficits. Providing pt with education on visual scanning techniques and other visual compensatory techniques to adjust for visual field deficits and blurry vision. Pt reporting during reading tasks that she cannot see right side of words as well as certain words being in "the wrong place" (both laterally and vertically). Challenging problem solving, awareness, and use of visual compensatory techniques through oral care, mobility, IADL, and letter cancellation. Pt performing ADLs and mobility at Supervision. Continue to recommend dc to OP OT and will continue to follow acutely as admitted.    Follow Up Recommendations  Outpatient OT;Supervision/Assistance - 24 hour    Equipment Recommendations  None recommended by OT    Recommendations for Other Services PT consult    Precautions / Restrictions Precautions Precautions: None Restrictions Weight Bearing Restrictions: No       Mobility Bed Mobility Overal bed mobility: Modified Independent             General bed mobility comments: Pt required increased time to come to sitting EOB, required no assist  Transfers Overall transfer level: Needs assistance Equipment used: None Transfers: Sit to/from Stand Sit to Stand: Supervision         General transfer comment: Supervision for safety    Balance  Overall balance assessment: No apparent balance deficits (not formally assessed) Sitting-balance support: Feet supported Sitting balance-Leahy Scale: Good Sitting balance - Comments: static sitting WFL; able to shift weight anteriorly and left to right to adjust her gown and reach feet to floor with no imbalance   Standing balance support: No upper extremity supported;During functional activity Standing balance-Leahy Scale: Good                             ADL either performed or assessed with clinical judgement   ADL Overall ADL's : Needs assistance/impaired     Grooming: Oral care;Supervision/safety;Standing Grooming Details (indicate cue type and reason): Pt performing oral care at the sink with Supervision. Noting poor FM skills and requiring increased time for opening containers. Pt also presenting with visual field defcitis. Providing education on scanning techniques for locating objects as pt reporting right visual field cut as well as "jumbled" vision. Pt requiring Mod cues for problem solving scanning.                  Toilet Transfer: Supervision/safety;Ambulation Toilet Transfer Details (indicate cue type and reason): simulated to recliner         Functional mobility during ADLs: Supervision/safety General ADL Comments: Focused session on cognition and vision deficits. Using oral care tasks, mobility in hallway, IADL (making coffee), and letter cancellation tasks to challenge cognition (awareness and problem solving) and vision techniques     Vision   Vision Assessment?: Yes Eye Alignment: Within Functional Limits Ocular Range of Motion: Within Functional Limits Alignment/Gaze Preference: Within Defined Limits(Slightly to left) Tracking/Visual Pursuits: Decreased smoothness of eye movement to RIGHT  inferior field;Requires cues, head turns, or add eye shifts to track Visual Fields: Right visual field deficit(R inattention, pt not reading words on R side  of paper) Additional Comments: Pt reporting she has both "jumbled" vision where objects are moved vertically and laterally as well as right visual field cuts as seen during reading task of menu and oral care. During Letter cancellation, pt only crossing out 2/16 "Bs". Required Max cues to problem solving using her finger and right<>left visual scanning to locate move "Bs". Pt then able to corss out 9/16 "Bs".   Perception     Praxis      Cognition Arousal/Alertness: Awake/alert Behavior During Therapy: WFL for tasks assessed/performed Overall Cognitive Status: Impaired/Different from baseline Area of Impairment: Memory;Following commands;Awareness;Problem solving;Attention                 Orientation Level: Disoriented to;Situation Current Attention Level: Sustained Memory: Decreased short-term memory Following Commands: Follows one step commands with increased time;Follows multi-step commands with increased time Safety/Judgement: Decreased awareness of deficits;Decreased awareness of safety Awareness: Emergent Problem Solving: Slow processing;Requires verbal cues General Comments: Focused session on visual deficits, awareness, and problem solving. Pt requiring Mod cues for problem solving techniques to ocmpensate for visual deficits. Pt with increased awareness of problem areas but difficulty creating and problem solving soluations requiring Mod-Max cues.         Exercises Exercises: Other exercises Other Exercises Other Exercises: Visual letter cancellation for visual scanning.    Shoulder Instructions       General Comments Notified RN that pt's breakfast has not arrived    Pertinent Vitals/ Pain       Pain Assessment: No/denies pain  Home Living                                          Prior Functioning/Environment              Frequency  Min 3X/week        Progress Toward Goals  OT Goals(current goals can now be found in the care  plan section)  Progress towards OT goals: Progressing toward goals  Acute Rehab OT Goals Patient Stated Goal: "get back to normal." OT Goal Formulation: With patient/family Time For Goal Achievement: 03/26/19 Potential to Achieve Goals: Good ADL Goals Pt Will Perform Grooming: with supervision;standing Pt Will Perform Upper Body Bathing: with modified independence;sitting Pt Will Perform Lower Body Bathing: with modified independence;sit to/from stand Pt Will Perform Upper Body Dressing: with modified independence;sitting Pt Will Perform Lower Body Dressing: with modified independence;sit to/from stand Pt Will Transfer to Toilet: with modified independence;ambulating;regular height toilet Pt Will Perform Toileting - Clothing Manipulation and hygiene: with modified independence;sit to/from stand Pt Will Perform Tub/Shower Transfer: Shower transfer;ambulating Pt/caregiver will Perform Home Exercise Program: With written HEP provided;Independently Additional ADL Goal #1: Pt will recall and perform 3/3 grooming tasks at sink with min VCs. Additional ADL Goal #2: Pt will complete simple path finding task with min VCs.  Plan Discharge plan remains appropriate    Co-evaluation                 AM-PAC OT "6 Clicks" Daily Activity     Outcome Measure   Help from another person eating meals?: None Help from another person taking care of personal grooming?: None Help from another person toileting, which includes using toliet, bedpan, or urinal?: None  Help from another person bathing (including washing, rinsing, drying)?: A Little Help from another person to put on and taking off regular upper body clothing?: None Help from another person to put on and taking off regular lower body clothing?: None 6 Click Score: 23    End of Session    OT Visit Diagnosis: Muscle weakness (generalized) (M62.81);Low vision, both eyes (H54.2);Other symptoms and signs involving cognitive  function;Cognitive communication deficit (R41.841) Symptoms and signs involving cognitive functions: Cerebral infarction Hemiplegia - Right/Left: Right Hemiplegia - dominant/non-dominant: Dominant Hemiplegia - caused by: Other Nontraumatic intracranial hemorrhage   Activity Tolerance Patient tolerated treatment well   Patient Left with call bell/phone within reach;in chair;with chair alarm set   Nurse Communication Mobility status        Time: [phone] OT Time Calculation (min): 30 min  Charges: OT General Charges $OT Visit: 1 Visit OT Treatments $Self Care/Home Management : 8-22 mins $Therapeutic Activity: 8-22 mins  [place], OTR/L Acute Rehab Pager: [phone] Office: [place] 03/15/2019, 10:19 AM

## 2019-03-16 LAB — CULTURE, BLOOD (ROUTINE X 2): Culture: NO GROWTH

## 2019-03-16 LAB — CRYOGLOBULIN

## 2019-03-17 LAB — VARICELLA-ZOSTER BY PCR: Varicella-Zoster, PCR: NEGATIVE

## 2019-03-18 LAB — CULTURE, BLOOD (ROUTINE X 2)
Culture: NO GROWTH
Culture: NO GROWTH
Special Requests: ADEQUATE

## 2019-03-21 ENCOUNTER — Ambulatory Visit: Payer: Self-pay

## 2019-03-21 NOTE — Telephone Encounter (Signed)
See note, please advise if patient can be worked in sooner.

## 2019-03-21 NOTE — Telephone Encounter (Signed)
Pt. And son. Belinda Banks, calling to see if she could be seen sooner than 05/06/19. Recent hospitalization after a stroke. No changes since her discharge. Still has headaches that come and go and vision changes.Has not heard from PT yet. Encouraged them to call them and get set up. Verbalizes understanding. Please advise pt. About being seen sooner.  Answer Assessment - Initial Assessment Questions 1. LOCATION: "Where does it hurt?"      Hurts at the temple 2. ONSET: "When did the headache start?" (Minutes, hours or days)      03/11/19 3. PATTERN: "Does the pain come and go, or has it been constant since it started?"     Comes and goes 4. SEVERITY: "How bad is the pain?" and "What does it keep you from doing?"  (e.g., Scale 1-10; mild, moderate, or severe)   - MILD (1-3): doesn't interfere with normal activities    - MODERATE (4-7): interferes with normal activities or awakens from sleep    - SEVERE (8-10): excruciating pain, unable to do any normal activities        3-5 5. RECURRENT SYMPTOM: "Have you ever had headaches before?" If so, ask: "When was the last time?" and "What happened that time?"      Yes 6. CAUSE: "What do you think is causing the headache?"     Stroke 03/11/19 7. MIGRAINE: "Have you been diagnosed with migraine headaches?" If so, ask: "Is this headache similar?"      No 8. HEAD INJURY: "Has there been any recent injury to the head?"      No - Had a stroke 03/11/19 9. OTHER SYMPTOMS: "Do you have any other symptoms?" (fever, stiff neck, eye pain, sore throat, cold symptoms)     Vision changes 10. PREGNANCY: "Is there any chance you are pregnant?" "When was your last menstrual period?"       No  Protocols used: HEADACHE-A-AH

## 2019-03-22 NOTE — Telephone Encounter (Signed)
Patient is set up to see neurology.

## 2019-03-23 ENCOUNTER — Ambulatory Visit: Payer: Managed Care, Other (non HMO) | Attending: Neurology | Admitting: Occupational Therapy

## 2019-03-23 ENCOUNTER — Encounter: Payer: Self-pay | Admitting: Speech Pathology

## 2019-03-23 ENCOUNTER — Ambulatory Visit: Payer: Managed Care, Other (non HMO) | Admitting: Speech Pathology

## 2019-03-23 ENCOUNTER — Other Ambulatory Visit: Payer: Self-pay

## 2019-03-23 DIAGNOSIS — R41842 Visuospatial deficit: Secondary | ICD-10-CM | POA: Insufficient documentation

## 2019-03-23 DIAGNOSIS — M6281 Muscle weakness (generalized): Secondary | ICD-10-CM | POA: Insufficient documentation

## 2019-03-23 DIAGNOSIS — R278 Other lack of coordination: Secondary | ICD-10-CM

## 2019-03-23 DIAGNOSIS — R41840 Attention and concentration deficit: Secondary | ICD-10-CM | POA: Diagnosis present

## 2019-03-23 DIAGNOSIS — R4701 Aphasia: Secondary | ICD-10-CM

## 2019-03-23 DIAGNOSIS — R41841 Cognitive communication deficit: Secondary | ICD-10-CM | POA: Insufficient documentation

## 2019-03-23 DIAGNOSIS — R41844 Frontal lobe and executive function deficit: Secondary | ICD-10-CM | POA: Diagnosis present

## 2019-03-23 NOTE — Patient Instructions (Signed)
   Neuro-opthamologist   Large wall calendar - [place] cross of the days, use this to check if you have taken meds or not   Try some easier chores, such as folding, unloading the silverware from the dishwasher  . Recap - check you both understand . Repeat what you have heard to make sure you have processed information correctly . Use shorter sentences and pauses to allow Jolana to process what is said  Aphasia does not affect intelligence, only language. The person with aphasia can still: make decisions, have opinions, and socialize.   Describing words  What group does it belong to?  What do I use it for?  Where can I find it?  What does it LOOK like?  What other words go with it?  What is the 1st sound of the word?          Many Ways to Communicate  Describe it Write it Draw it Gesture it Use related words   Provided by: Barnabas Lister Strawn, [phone]

## 2019-03-23 NOTE — Therapy (Signed)
[facility] [address] [place], [place], [address] Phone: [phone]   Fax:  [phone]  Speech Language Pathology Evaluation  Patient Details  Name: Belinda Banks MRN: [id number] Date of Birth: 08-Sep-1955 Referring Provider (SLP): Dr. Rosalin Hawking   Encounter Date: 03/23/2019  End of Session - 03/23/19 1253    Visit Number  1    Number of Visits  17    Date for SLP Re-Evaluation  05/18/19    SLP Start Time  [id number]    SLP Stop Time   1235    SLP Time Calculation (min)  50 min    Activity Tolerance  Patient tolerated treatment well       Past Medical History:  Diagnosis Date  . Hypertension    controlled    Past Surgical History:  Procedure Laterality Date  . ABDOMINAL HYSTERECTOMY    . BACK SURGERY    . BUBBLE STUDY  03/14/2019   Procedure: BUBBLE STUDY;  Surgeon: [name], MD;  Location: [facility];  Service: Cardiovascular;;  . TEE WITHOUT CARDIOVERSION N/A 03/14/2019   Procedure: TRANSESOPHAGEAL ECHOCARDIOGRAM (TEE);  Surgeon: [name] [name], MD;  Location: [facility] ENDOSCOPY;  Service: Cardiovascular;  Laterality: N/A;    There were no vitals filed for this visit.  Subjective Assessment - 03/23/19 1243    Subjective  My memory is affected, but vision is my worst problem"    Patient is accompained by:  Family member   spouse, [name]   Currently in Pain?  Yes    Pain Score  2     Pain Location  Head    Pain Descriptors / Indicators  Aching    Pain Type  Acute pain    Pain Onset  1 to 4 weeks ago    Aggravating Factors   IDK    Pain Relieving Factors  IDK    Multiple Pain Sites  No         SLP Evaluation [facility] - 03/23/19 1243      SLP Visit Information   SLP Received On  03/23/19    Referring Provider (SLP)  Dr. Rosalin Hawking    Onset Date  03/05/19    Medical Diagnosis  ICH      Subjective   Patient/Family Stated Goal  To get back to normal, go back to work and [employer]  Information   HPI  Belinda Banks is a 63 y.o. female with medical history significant of  2 recent ICH's, hypertension, Type 2 diabetes, hyperlipidemia. Patient presented to the ED on 03/11/2019 with increasing confusion and aphasia following a recent admission for [place] 11/14-11/18. MRI shows multiple acute to early subacute cerebral infarct bilaterally with largest in the posterior left MCA region. Hospitalized for 2nd [place] 03/11/19 to 03/15/19.    Mobility Status  walks independently      Balance Screen   Has the patient had a decrease in activity level because of a fear of falling?   No    Is the patient reluctant to leave their home because of a fear of falling?   No      Prior Functional Status   Cognitive/Linguistic Baseline  Within functional limits    Type of Home  House     Lives With  Spouse    Available Support  Family    Vocation  Full time employment      Cognition   Overall Cognitive Status  Impaired/Different from baseline    Area of Impairment  Orientation;Attention;Memory;Following commands;Awareness;Problem solving    Orientation Level  --   date   Current Attention Level  Sustained    Attention  Selective;Alternating;Divided    Sustained Attention  Impaired    Sustained Attention Impairment  Verbal complex;Functional complex    Selective Attention  Impaired    Selective Attention Impairment  Verbal basic;Functional basic    Alternating Attention  Impaired    Alternating Attention Impairment  Verbal basic;Functional basic    Memory  Impaired    Memory Impairment  Decreased short term memory;Prospective memory;Decreased recall of new information    Decreased Short Term Memory  Verbal basic;Functional basic    Awareness  Impaired    Awareness Impairment  Intellectual impairment    Problem Solving  Impaired    Problem Solving Impairment  Verbal basic;Functional basic    Executive Function  Sequencing;Organizing;Decision Making;Self Monitoring;Initiating;Self  Correcting      Auditory Comprehension   Overall Auditory Comprehension  Impaired    Yes/No Questions  Within Functional Limits    Commands  Within Functional Limits    Conversation  Simple    Interfering Components  Attention;Visual impairments;Processing speed;Working Building control surveyor  Not tested      Reading Comprehension   Reading Status  Unable to assess (comment)   visual impairment     Expression   Primary Mode of Expression  Verbal      Verbal Expression   Overall Verbal Expression  Impaired    Initiation  No impairment    Automatic Speech  --   WFL   Level of Generative/Spontaneous Verbalization  Conversation    Repetition  No impairment    Naming  Impairment    Responsive  76-100% accurate    Confrontation  75-100% accurate    Convergent  Not tested    Divergent  50-74% accurate    Verbal Errors  Aware of errors    Pragmatics  No impairment    Interfering Components  Attention    Effective Techniques  Phonemic cues;Sentence completion;Semantic cues      Written Expression   Dominant Hand  Right    Written Expression  Exceptions to [facility]    Dictation Ability  Sentence    Self Formulation Ability  Phrase      Oral Motor/Sensory Function   Overall Oral Motor/Sensory Function  Appears within functional limits for tasks assessed      Motor Speech   Overall Motor Speech  Appears within functional limits for tasks assessed                      SLP Education - 03/23/19 1252    Education Details  compensations for med management, attention, memory       SLP Short Term Goals - 03/23/19 1314      SLP SHORT TERM GOAL #1   Title  Pt will utilize calendar and med Environmental education officer to recall if she has taken her meds or not, twice a day with min A from spouse over 2 sessions    Time  4    Period  Weeks    Status  New      SLP  SHORT TERM GOAL #2   Title  Pt will verbally sequence 4-5 steps in chores and meals that she did prior to CVA with rare min  A over 2 sessions    Time  4    Period  Weeks    Status  New      SLP SHORT TERM GOAL #3   Title  Pt will complete mildly complex naming tasks with occasional min A and 90% accuracy    Time  4    Period  Weeks    Status  New      SLP SHORT TERM GOAL #4   Title  Pt and spouse will carryover 2 strategies for processing conversation and instructions outside of therapy per their report over 2 sessions    Time  4    Period  Weeks    Status  New       SLP Long Term Goals - 03/23/19 1317      SLP LONG TERM GOAL #1   Title  Pt will manage medications 2/3x a day she takes meds with external aids and rare min A from spouse over 3 sessions    Time  8    Period  Weeks    Status  New      SLP LONG TERM GOAL #2   Title  Pt will carryover 3 compensations for attention to complete IADL's with occasional min A from spouse over 2 sessions    Time  8    Period  Weeks    Status  New      SLP LONG TERM GOAL #3   Title  Pt will utilize compensations for aphasia in mildly complex 15 minute conversation with rare min A over 3 sessions    Time  [place] - 03/23/19 1258    Clinical Impression Statement  Foster Stogsdill is referred for aphasia and cognitive communication impairments following 2 ICH's. 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 She managed household finances and enjoyed cooking. Today she presents with moderate cognitive communication impairment and mild aphasia. Bhavika demonstrates difficulty organizing her thoughts for conversation. Her description of her job was vague with empty words and word finding diffiuclties. She required max questioning cues to communicate her job and responsibilities. Confrontation naming for basic objects 10/10. She named 6 animals in a minute.  Attemtpted the Cognitive Linguistic Quick Test, however this was aborted due to severe visual impairments from the strokes. Haylyn was unable to perfrom serial 7 subtractions. [place] demonstrated moderate attention and processing when I gave her simple time word problem, she stated you already lost me, after 1 sentence. She affirms that she has to ask her family to repeat themselves as she is having difficulty processing speech. Anaiah enjoys cooking, but was unable to tell me specifically what dishes she cooks, stating "anything, I fix food." She required max questioning cues to name 1 dish she cooks. Inella exhibited reduced sequencing and attention to details when I asked her how she cooked eggplant parmesean. Markiya verbalizes memory impairment and is aware that she is having trouble getting her words out, however emergent awarenes is impaired as she is not attempting strategies for these impairments. When she can't find a word, Yanellie states that she gives up. She is distracted also by her visual impairments. Loany did not know the month or date. She did not recall the date of Christmas. At this time, her spouse, [name] is managing her meds. He and Ortensia report that Josiah repeatedly asks  if she has taken her meds as she is fearful of forgetting them.  Overall, Michelene's affect is flat. Her spouse verbalizes concern for depression. I  recommend skilled ST to maximize cognitive communication skills and train pt and spouse in compensations for cognition and communication for improved independence and QOL. Mackynzi states she wishes to return to work.    Speech Therapy Frequency  2x / week    Duration  --   8 weeks or 17 visits   Treatment/Interventions  Environmental controls;Language facilitation;Compensatory techniques;Cognitive reorganization;Functional tasks;SLP instruction and feedback;Compensatory strategies;Patient/family education;Multimodal communcation approach;Internal/external aids    Potential to Achieve Goals  Good     Potential Considerations  Ability to learn/carryover information    Consulted and Agree with Plan of Care  Patient;Family member/caregiver       Patient will benefit from skilled therapeutic intervention in order to improve the following deficits and impairments:   Cognitive communication deficit  Aphasia    Problem List Patient Active Problem List   Diagnosis Date Noted  . CVA (cerebral vascular accident) ([place]) 03/11/2019  . Hypertensive emergency 03/09/2019  . Hyperlipidemia 03/09/2019  . DM (diabetes mellitus) ([place]) 03/07/2019  . HTN (hypertension) 03/07/2019  . Alcohol use 03/07/2019  . 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

## 2019-03-23 NOTE — Therapy (Signed)
[facility] [address] [place], [place], [address] Phone: [phone]   Fax:  [phone]  Occupational Therapy Evaluation  Patient Details  Name: Belinda Banks MRN: [id number] Date of Birth: [birth year] Referring Provider (OT): Dr. Erlinda Hong   Encounter Date: 03/23/2019  OT End of Session - 03/23/19 1722    Visit Number  1    Number of Visits  25    Date for OT Re-Evaluation  06/21/19    Authorization Type  [employer]    Authorization Time Period  60 days    Authorization - Visit Number  1    Authorization - Number of Visits  30    OT Start Time  1022    OT Stop Time  1100    OT Time Calculation (min)  38 min    Activity Tolerance  Patient tolerated treatment well    Behavior During Therapy  Restless       Past Medical History:  Diagnosis Date  . Hypertension    controlled    Past Surgical History:  Procedure Laterality Date  . ABDOMINAL HYSTERECTOMY    . BACK SURGERY    . BUBBLE STUDY  03/14/2019   Procedure: BUBBLE STUDY;  Surgeon: [name], MD;  Location: [facility];  Service: Cardiovascular;;  . TEE WITHOUT CARDIOVERSION N/A 03/14/2019   Procedure: TRANSESOPHAGEAL ECHOCARDIOGRAM (TEE);  Surgeon: [name] [name], MD;  Location: [facility] ENDOSCOPY;  Service: Cardiovascular;  Laterality: N/A;    There were no vitals filed for this visit.  Subjective Assessment - 03/23/19 1026    Subjective   I want miracles    Patient Stated Goals  improve vision    Currently in Pain?  Yes    Pain Score  2     Pain Location  Head    Pain Descriptors / Indicators  Aching    Pain Type  Acute pain    Pain Onset  1 to 4 weeks ago    Pain Frequency  Intermittent    Aggravating Factors   unknown    Pain Relieving Factors  unknown        [facility] OT Assessment - 03/23/19 1723      Assessment   Medical Diagnosis  ICH     Referring Provider (OT)  Dr. Erlinda Hong    Onset Date/Surgical Date  03/11/19      Precautions    Precautions  Fall      Balance Screen   Has the patient fallen in the past 6 months  No    Has the patient had a decrease in activity level because of a fear of falling?   No    Is the patient reluctant to leave their home because of a fear of falling?   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, computer      ADL   Eating/Feeding  Modified independent   needs assist cutting food at times   Grooming  Modified independent    Upper Body Bathing  Supervision/safety    Lower Body Bathing  Supervision/safety    Upper Body  Dressing  Minimal assistance    Lower Body Dressing  Supervision/safety;Set up    [place] Transfer  Min guard      IADL   Shopping  Needs to be accompanied on any shopping trip    Light Housekeeping  Needs help with all home maintenance tasks    Meal Prep  Needs to have meals prepared and served    Medication Management  Takes responsibility if medication is prepared in advance in seperate dosage    Financial Management  Requires assistance      Mobility   Mobility Status  --   supervision     Vision Assessment   Vision Assessment  Vision tested    Ocular Range of Motion  Within Functional Limits    Visual Fields  Right visual field deficit   complaints of blurry vision and unable to see   Patient has diffculty with activities due to visual impairment  Reading bills   watching TV   Comment  Pt was unable to perform number cancellation task due to severe visual and cognitive deficits, pt to bring in readers next visit . Pt is unable to read fine print at sentence level.     Cognition   Overall Cognitive Status  Impaired/Different from  baseline    Area of Impairment  Orientation;Attention;Memory;Following commands;Awareness;Problem solving    Current Attention Level  Sustained    Attention  Selective;Alternating;Divided    Sustained Attention  Impaired    Selective Attention  Impaired    Memory  Impaired    Memory Impairment  Decreased short term memory;Prospective memory;Decreased recall of new information    Awareness  Impaired    Awareness Impairment  Intellectual impairment    Problem Solving  Impaired    Problem Solving Impairment  Verbal basic;Functional basic    Executive Function  Organizing;Sequencing;Decision Making      Coordination   Gross Motor Movements are Fluid and Coordinated  No    Fine Motor Movements are Fluid and Coordinated  No    9 Hole Peg Test  Right;Left    Right 9 Hole Peg Test  59.94    Left 9 Hole Peg Test  47.37      ROM / Strength   AROM / PROM / Strength  AROM;Strength      AROM   Overall AROM   Within functional limits for tasks performed    Overall AROM Comments  Pt demonstrates decreased RUE control      Strength   Overall Strength  Deficits    Overall Strength Comments  RUE grossly 3+/5 proximally 4/5 distally, LUE 4+/5      Hand Function   Right Hand Grip (lbs)  29.9 lbs    Left Hand Grip (lbs)  50.9 lbs                        OT Short Term Goals - 03/23/19 1730      OT SHORT TERM GOAL #1   Title  Pt/ caregiver will be I with inital HEP.due 05/07/2019    Time  6    Period  Weeks    Status  New    Target Date  05/07/19      OT SHORT TERM GOAL #2   Title  Pt./caregiver will verbalize understanding of visual compensation strategies    Time  6    Period  Weeks  OT SHORT TERM GOAL #3   Title  Pt will perform tabletop scanning activities with 75% accuracy and min v.c    Time  6    Period  Weeks    Status  New      OT SHORT TERM GOAL #4   Title  Pt will demonstrate improved fine motor coordination for ADLS as evidenced by decreasing 9 hole  peg test scaore to 50 secs or less.    Time  6    Period  Weeks    Status  New      OT SHORT TERM GOAL #5   Title  Pt will navigate a  minimally distracting environment and locate items with 75% or better accuracy.    Time  6    Period  Weeks    Status  New      Additional Short Term Goals   Additional Short Term Goals  Yes      OT SHORT TERM GOAL #6   Title  Pt will demonstrate ability to read a large print sentence with no more than min v.c    Time  6    Period  Weeks    Status  New      OT SHORT TERM GOAL #7   Title  Pt will perform simple familiar home managment tasks with no more than min A.    Time  6    Period  Weeks    Status  New        OT Long Term Goals - 03/23/19 1735      OT LONG TERM GOAL #1   Title  Pt will be modified indpendent with all basic ADLs.-06/21/2019    Time  12    Period  Weeks    Status  New    Target Date  06/21/19      OT LONG TERM GOAL #2   Title  Pt will perform mod complex home management  with supervision, min v.c    Time  12    Period  Weeks    Status  New      OT LONG TERM GOAL #3   Title  Pt will perfrom basic cooking with min A    Time  12    Period  Weeks    Status  New      OT LONG TERM GOAL #4   Title  Pt will demonstrate ability to read a short large print paragraph with no more than min v.c    Time  12    Period  Weeks    Status  New      OT LONG TERM GOAL #5   Title  Pt will perform tabletop scanning activities with 90% or better accuracy.    Time  12    Period  Weeks    Status  New      OT LONG TERM GOAL #6   Title  Pt will perform environmental scanning with 90% or better accuracy in a mod distracting environment.    Time  12    Period  Weeks    Status  New            Plan - 03/23/19 1718    Clinical Impression Statement  Belinda Banks is a 63 y.o. female with medical history significant of  2 recent ICH's, hypertension, Type 2 diabetes, hyperlipidemia. Patient presented to the ED on 03/11/2019  with increasing confusion and aphasia following a recent admission  for ICH 11/14-11/18. MRI shows multiple acute to early subacute cerebral infarct bilaterally with largest in the posterior left MCA region. Hospitalized for 2nd [place] 03/11/19 to 03/15/19. Pt presents to occupational therapy with the following deficits: decreased RUE strength and coordination, visual impairments, cognitive deficits which impede perfromance of ADLS/ IADLS. Pt can benefit from skilled occupational therapy to maximize safety and independence with ADLs/ IADLS.    OT Occupational Profile and History  Detailed Assessment- Review of Records and additional review of physical, cognitive, psychosocial history related to current functional performance    Occupational performance deficits (Please refer to evaluation for details):  ADL's;IADL's;Work;Leisure;Social Participation    Body Structure / Function / Physical Skills  ADL;UE functional use;Flexibility;Vision;FMC;Cardiopulmonary status limiting activity;Balance;Continence;Gait;ROM;GMC;Coordination;Decreased knowledge of precautions;IADL;Decreased knowledge of use of DME;Dexterity;Mobility;Strength    Rehab Potential  Good    Clinical Decision Making  Several treatment options, min-mod task modification necessary    Comorbidities Affecting Occupational Performance:  May have comorbidities impacting occupational performance    Modification or Assistance to Complete Evaluation   Min-Moderate modification of tasks or assist with assess necessary to complete eval   due to visual and cognitive deficits   OT Frequency  2x / week    OT Duration  12 weeks    OT Treatment/Interventions  Self-care/ADL training;Visual/perceptual remediation/compensation;Patient/family education;Aquatic Therapy;DME and/or AE instruction;Paraffin;Passive range of motion;Balance training;Cryotherapy;Fluidtherapy;Functional Mobility Training;Moist Heat;Therapeutic exercise;Manual Therapy;Therapeutic  activities;Neuromuscular education;Cognitive remediation/compensation    Plan  further assess vision and cognition in a functional context, assess environmental scanning, education regarding visual compensations.    Consulted and Agree with Plan of Care  Patient;Family member/caregiver    Family Member Consulted  husband       Patient will benefit from skilled therapeutic intervention in order to improve the following deficits and impairments:   Body Structure / Function / Physical Skills: ADL, UE functional use, Flexibility, Vision, FMC, Cardiopulmonary status limiting activity, Balance, Continence, Gait, ROM, GMC, Coordination, Decreased knowledge of precautions, IADL, Decreased knowledge of use of DME, Dexterity, Mobility, Strength       Visit Diagnosis: Visuospatial deficit - Plan: Ot plan of care cert/re-cert  Other lack of coordination - Plan: Ot plan of care cert/re-cert  Muscle weakness (generalized) - Plan: Ot plan of care cert/re-cert  Frontal lobe and executive function deficit - Plan: Ot plan of care cert/re-cert  Attention and concentration deficit - Plan: Ot plan of care cert/re-cert    Problem List Patient Active Problem List   Diagnosis Date Noted  . CVA (cerebral vascular accident) ([facility]) 03/11/2019  . Hypertensive emergency 03/09/2019  . Hyperlipidemia 03/09/2019  . DM (diabetes mellitus) ([facility]) 03/07/2019  . HTN (hypertension) 03/07/2019  . Alcohol use 03/07/2019  . 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

## 2019-03-24 ENCOUNTER — Telehealth: Payer: Self-pay | Admitting: General Practice

## 2019-03-24 NOTE — Telephone Encounter (Signed)
Called and spoke with pt's husband, let him know that we were not able to schedule pt for a sooner appt. but she is on the cancellation list so we would give them a call if we were able to move her appt up. Patient's husband said thank you.

## 2019-03-24 NOTE — Telephone Encounter (Signed)
She does not need to be triaged.You can schedule a earlier date.

## 2019-03-24 NOTE — Telephone Encounter (Signed)
When can I work them in? Dr. Jerline Pain only sees 1 NP/TOC per session and they are supposed to be 40 minutes IN office. Please let me know a day/time that it would be OK for me to move pt to. Thank you!

## 2019-03-24 NOTE — Telephone Encounter (Signed)
Sorry Dr. Jerline Pain is booked for new patients she is on the cancellation list,also I can not triage a patient that we have not seen before. Disregard last message

## 2019-03-24 NOTE — Telephone Encounter (Signed)
Copied from [place] [id number]. Topic: Appointment Scheduling - Scheduling Inquiry for Clinic >> Mar 24, 2019  1:34 PM Belinda Banks wrote: Reason for CRM:pt is on cancellation list and pt son  [name] is calling to see if his mother can be seen sooner. Pt has had a stroke . Pt schedule for 05/06/2019  Please advise, I am not sure if this patient would need to be triaged or not since she is not established with Dr. Jerline Pain yet.

## 2019-03-28 ENCOUNTER — Ambulatory Visit: Payer: Managed Care, Other (non HMO) | Admitting: Speech Pathology

## 2019-03-28 ENCOUNTER — Encounter: Payer: Self-pay | Admitting: Speech Pathology

## 2019-03-28 ENCOUNTER — Other Ambulatory Visit: Payer: Self-pay

## 2019-03-28 DIAGNOSIS — R4701 Aphasia: Secondary | ICD-10-CM

## 2019-03-28 DIAGNOSIS — R41842 Visuospatial deficit: Secondary | ICD-10-CM | POA: Diagnosis not present

## 2019-03-28 DIAGNOSIS — R41841 Cognitive communication deficit: Secondary | ICD-10-CM

## 2019-03-28 NOTE — Patient Instructions (Addendum)
   Weighted sleep masks or eye masks   When you are working, set a timer for 15 minutes, then take a break in a quiet, dark room with eyes closed for 15 minutes   Focus on your breath - if your mind spins - say "peace" and focus on your breathing    Try to pay attention to your body when you feel a headache coming on, take a break before the headache gets bad  Cognitive fatigue results from processing lots of information and sensory processing (lights, sounds, smells, conversation)  Limit visits and conversation to 10 to 15 minutes - group conversations will be hard to process - tell your friends "the Doctor said I can only be on the phone or only visit for 15 minutes."  Brain fog  Try a colorful placemat under your plate   Love the idea of a shower chair so you can rest in the shower  Listen to your body!   Think about covering the remote buttons except the volume and channel up and down to help her visual processing of the remote

## 2019-03-28 NOTE — Therapy (Signed)
[facility] [address] [place], [place], [address] Phone: [phone]   Fax:  [phone]  Speech Language Pathology Treatment  Patient Details  Name: Belinda Banks MRN: [id number] Date of Birth: 05/21/1955 Referring Provider (SLP): Dr. Rosalin Hawking   Encounter Date: 03/28/2019  End of Session - 03/28/19 1216    Visit Number  2    Number of Visits  17    Date for SLP Re-Evaluation  05/18/19    SLP Start Time  1105    SLP Stop Time   1146    SLP Time Calculation (min)  41 min    Activity Tolerance  Patient tolerated treatment well       Past Medical History:  Diagnosis Date  . Hypertension    controlled    Past Surgical History:  Procedure Laterality Date  . ABDOMINAL HYSTERECTOMY    . BACK SURGERY    . BUBBLE STUDY  03/14/2019   Procedure: BUBBLE STUDY;  Surgeon: [name], MD;  Location: [facility];  Service: Cardiovascular;;  . TEE WITHOUT CARDIOVERSION N/A 03/14/2019   Procedure: TRANSESOPHAGEAL ECHOCARDIOGRAM (TEE);  Surgeon: [name] [name], MD;  Location: [facility] ENDOSCOPY;  Service: Cardiovascular;  Laterality: N/A;    There were no vitals filed for this visit.         ADULT SLP TREATMENT - 03/28/19 1152      General Information   Behavior/Cognition  Alert;Cooperative;Pleasant mood      Treatment Provided   Treatment provided  Cognitive-Linquistic      Cognitive-Linquistic Treatment   Treatment focused on  Cognition;Patient/family/caregiver education    Skilled Treatment  Pt enters with note from her son explaining that her vision is better when she is not having to process information or busy patterns. We generated strategy of taking sensory breaks when she has to focus on a task, use a timer for 15 minutes, then take a break with her eyes closed in quiet room. Explained that she may have to take these breaks throughout the day to reduce headaches. Pt reports she got fatigue while  showering and had to crawl out of the shower and lay on the bed for a while. Instructed them to use a shower chair, so Taniya can rest even in the shower as needed, as she reports reduced balance when she gets fatigued. Initiated training in auditory comprehension/processing strategies of spouse using shorter phrases with pauses and Varie repeating back what she has heard. She demonstrated this with me with occasional questioning and verbal cues. Educated [place] and spouse that processing group conversations or long conversation can result in cognitive fatigue as well. Jamira affirms this and has avoided talking to her friends who call her. We discussed limiting conversations or visits to 15 minutes, then taking a break afterwards. Delilia does report that she had family over for dinner and had to get up and leave the table due to fatigue. She is also having difficulty finding her food on her plate. She will try to use a bright placemat to help locate her plate and utensils.       Assessment / Recommendations / Plan   Plan  Continue with current plan of care       SLP Education - 03/28/19 1210    Education Details  compensations for auditory comprehension/processing; compensations for sensory hyperstimulation; compensations for visual processing    Person(s) Educated  Patient;Spouse    Methods  Explanation;Handout;Verbal cues;Demonstration    Comprehension  Verbalized understanding;Returned demonstration;Need further instruction       SLP Short Term Goals - 03/28/19 1214      SLP SHORT TERM GOAL #1   Title  Pt will utilize calendar and med Environmental education officer to recall if she has taken her meds or not, twice a day with min A from spouse over 2 sessions    Time  4    Period  Weeks    Status  On-going      SLP SHORT TERM GOAL #2   Title  Pt will verbally sequence 4-5 steps in chores and meals that she did prior to CVA with rare min A over 2 sessions    Time  4    Period  Weeks    Status  On-going      SLP  SHORT TERM GOAL #3   Title  Pt will complete mildly complex naming tasks with occasional min A and 90% accuracy    Time  4    Period  Weeks    Status  On-going      SLP SHORT TERM GOAL #4   Title  Pt and spouse will carryover 2 strategies for processing conversation and instructions outside of therapy per their report over 2 sessions    Time  4    Period  Weeks       SLP Long Term Goals - 03/28/19 1215      SLP LONG TERM GOAL #1   Title  Pt will manage medications 2/3x a day she takes meds with external aids and rare min A from spouse over 3 sessions    Time  8    Period  Weeks    Status  On-going      SLP LONG TERM GOAL #2   Title  Pt will carryover 3 compensations for attention to complete IADL's with occasional min A from spouse over 2 sessions    Time  8    Period  Weeks    Status  On-going      SLP LONG TERM GOAL #3   Title  Pt will utilize compensations for aphasia in mildly complex 15 minute conversation with rare min A over 3 sessions    Time  8    Period  Weeks    Status  On-going       Plan - 03/28/19 1211    Clinical Impression [place] continues to present with 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 Visual impairments greatly affecting attention and processing. She continues to report cognitive fatigue resulting in headaches. Initiated compensations for attention and cognitive fatigue incluidng using a timer and working for shorter periods of time, taking breaks, and repeating back what she has heard. Continue skilled ST to maximize cognition and communication for improved QOL, independence and safety.    Speech Therapy Frequency  2x / week    Duration  --   8 weeks or 17 visits   Treatment/Interventions  Environmental controls;Language facilitation;Compensatory techniques;Cognitive reorganization;Functional tasks;SLP instruction and feedback;Compensatory strategies;Patient/family education;Multimodal communcation  approach;Internal/external aids    Potential Considerations  Ability to learn/carryover information       Patient will benefit from skilled therapeutic intervention in order to improve the following deficits and impairments:   Cognitive communication deficit  Aphasia    Problem List Patient Active Problem List   Diagnosis Date Noted  . CVA (cerebral vascular accident) ([place]) 03/11/2019  . Hypertensive emergency 03/09/2019  . Hyperlipidemia 03/09/2019  . DM (diabetes mellitus) ([place])  03/07/2019  . HTN (hypertension) 03/07/2019  . Alcohol use 03/07/2019  . 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 [place], [place], [address] Phone: [phone]   Fax:  [phone]   Name: VERVA KROTZER MRN: [id number] Date of Birth: Jan 29, 1956

## 2019-03-30 ENCOUNTER — Ambulatory Visit: Payer: Managed Care, Other (non HMO) | Admitting: Speech Pathology

## 2019-03-30 ENCOUNTER — Other Ambulatory Visit: Payer: Self-pay

## 2019-03-30 ENCOUNTER — Encounter: Payer: Self-pay | Admitting: Speech Pathology

## 2019-03-30 DIAGNOSIS — R4701 Aphasia: Secondary | ICD-10-CM

## 2019-03-30 DIAGNOSIS — R41841 Cognitive communication deficit: Secondary | ICD-10-CM

## 2019-03-30 DIAGNOSIS — R41842 Visuospatial deficit: Secondary | ICD-10-CM | POA: Diagnosis not present

## 2019-03-30 NOTE — Therapy (Signed)
[facility] [address] [place], [place], [address] Phone: [phone]   Fax:  [phone]  Speech Language Pathology Treatment  Patient Details  Name: Belinda Banks MRN: [id number] Date of Birth: [birth year] Referring Provider (SLP): Dr. Rosalin Hawking   Encounter Date: 03/30/2019  End of Session - 03/30/19 1135    Visit Number  3    Number of Visits  17    Date for SLP Re-Evaluation  05/18/19    SLP Start Time  0935    SLP Stop Time   [id number]    SLP Time Calculation (min)  39 min    Activity Tolerance  Patient tolerated treatment well       Past Medical History:  Diagnosis Date  . Hypertension    controlled    Past Surgical History:  Procedure Laterality Date  . ABDOMINAL HYSTERECTOMY    . BACK SURGERY    . BUBBLE STUDY  03/14/2019   Procedure: BUBBLE STUDY;  Surgeon: [name], MD;  Location: [facility];  Service: Cardiovascular;;  . TEE WITHOUT CARDIOVERSION N/A 03/14/2019   Procedure: TRANSESOPHAGEAL ECHOCARDIOGRAM (TEE);  Surgeon: [name] [name], MD;  Location: [facility] ENDOSCOPY;  Service: Cardiovascular;  Laterality: N/A;    There were no vitals filed for this visit.  Subjective Assessment - 03/30/19 0939    Subjective  "I've been taking the breaks and I think they help"    Patient is accompained by:  Family member   spouse, [name]   Currently in Pain?  No/denies            ADULT SLP TREATMENT - 03/30/19 0940      General Information   Behavior/Cognition  Alert;Cooperative;Pleasant mood      Treatment Provided   Treatment provided  Cognitive-Linquistic      Cognitive-Linquistic Treatment   Treatment focused on  Cognition;Patient/family/caregiver education    Skilled Treatment  Pt continues to report hallucinations. She is using weighted sleep mask during her sensory breaks and feels it helps her relax her brain. They bought a shower chair as well. Mildly complex divergent naming tasks  re: cooking which is pt's hobby. She named 4 types of cheese and required semantic and 1st letter cues to name 6 more. She named 5 spices/herbs and required frequent mod A to name 12; She named types of oils and vinegars with naming 3 independenlty and required semantic and phonemic cues to name 6. Initiated training in compensations for word fidning, generating descriptions for objects (concrete animals and veggies; abstract: Chirstmas words) with questioning cues required usually. Trained spouse to use questioning cues to A pt in communicating at home. No headache at the end of session. Lights dimmed, blinds closed      Assessment / Recommendations / Plan   Plan  Continue with current plan of care      Progression Toward Goals   Progression toward goals  Progressing toward goals       SLP Education - 03/30/19 1132    Education Details  compensations for aphasia; caregiver cueing for word finding and communication    Person(s) Educated  Patient;Spouse    Methods  Explanation;Demonstration;Verbal cues;Handout    Comprehension  Verbalized understanding       SLP Short Term Goals - 03/30/19 1135      SLP SHORT TERM GOAL #1   Title  Pt will utilize calendar and med Environmental education officer to recall if she has taken her meds or not, twice a day  with min A from spouse over 2 sessions    Time  4    Period  Weeks    Status  On-going      SLP SHORT TERM GOAL #2   Title  Pt will verbally sequence 4-5 steps in chores and meals that she did prior to CVA with rare min A over 2 sessions    Time  4    Period  Weeks    Status  On-going      SLP SHORT TERM GOAL #3   Title  Pt will complete mildly complex naming tasks with occasional min A and 90% accuracy    Time  4    Period  Weeks    Status  On-going      SLP SHORT TERM GOAL #4   Title  Pt and spouse will carryover 2 strategies for processing conversation and instructions outside of therapy per their report over 2 sessions    Time  4    Period  Weeks        SLP Long Term Goals - 03/30/19 1135      SLP LONG TERM GOAL #1   Title  Pt will manage medications 2/3x a day she takes meds with external aids and rare min A from spouse over 3 sessions    Time  8    Period  Weeks    Status  On-going      SLP LONG TERM GOAL #2   Title  Pt will carryover 3 compensations for attention to complete IADL's with occasional min A from spouse over 2 sessions    Time  8    Period  Weeks    Status  On-going      SLP LONG TERM GOAL #3   Title  Pt will utilize compensations for aphasia in mildly complex 15 minute conversation with rare min A over 3 sessions    Time  8    Period  Weeks    Status  On-going       Plan - 03/30/19 1134    Clinical Impression Statement  Belinda Banks continues to present with 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 Ongoing training for compensations for word finding impairments.  Visual impairments greatly affecting attention and processing. She continues to report cognitive fatigue resulting in headaches. Initiated compensations for attention and cognitive fatigue incluidng using a timer and working for shorter periods of time, taking breaks, and repeating back what she has heard. Continue skilled ST to maximize cognition and communication for improved QOL, independence and safety.    Speech Therapy Frequency  2x / week    Duration  --   8 weeks or 17 visits   Treatment/Interventions  Environmental controls;Language facilitation;Compensatory techniques;Cognitive reorganization;Functional tasks;SLP instruction and feedback;Compensatory strategies;Patient/family education;Multimodal communcation approach;Internal/external aids    Potential to Achieve Goals  Good    Potential Considerations  Ability to learn/carryover information       Patient will benefit from skilled therapeutic intervention in order to improve the following deficits and impairments:   Aphasia  Cognitive communication deficit    Problem  List Patient Active Problem List   Diagnosis Date Noted  . CVA (cerebral vascular accident) ([place]) 03/11/2019  . Hypertensive emergency 03/09/2019  . Hyperlipidemia 03/09/2019  . DM (diabetes mellitus) ([place]) 03/07/2019  . HTN (hypertension) 03/07/2019  . Alcohol use 03/07/2019  . 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

## 2019-03-30 NOTE — Patient Instructions (Signed)
  Tips for Talking with People who have Aphasia  . Say one thing at a time . Don't  rush - slow down, be patient . Talk face to face . Reduce background noise . Relax - be natural . Ask what helps . Recap - check you both understand . Be a partner, not a therapist   Aphasia does not affect intelligence, only language. The person with aphasia can still: make decisions, have opinions, and socialize.   Describing words  What group does it belong to?  What do I use it for?  Where can I find it?  What does it LOOK like?  What other words go with it?  What is the 1st sound of the word?  What does it remind you of?   Many Ways to Communicate  Describe it Write it Draw it Gesture it Use related words  Doristine Devoid job taking the breaks when you need it - listen to your body - keep it up   Provided by: Barnabas Lister [place], [phone]

## 2019-04-04 ENCOUNTER — Other Ambulatory Visit: Payer: Self-pay

## 2019-04-04 ENCOUNTER — Ambulatory Visit: Payer: Managed Care, Other (non HMO)

## 2019-04-04 DIAGNOSIS — R41842 Visuospatial deficit: Secondary | ICD-10-CM | POA: Diagnosis not present

## 2019-04-04 DIAGNOSIS — R41841 Cognitive communication deficit: Secondary | ICD-10-CM

## 2019-04-04 DIAGNOSIS — R4701 Aphasia: Secondary | ICD-10-CM

## 2019-04-04 NOTE — Therapy (Signed)
[facility] [address] [place], [place], [address] Phone: [phone]   Fax:  [phone]  Speech Language Pathology Treatment  Patient Details  Name: Belinda Banks MRN: [id number] Date of Birth: 11-09-1955 Referring Provider (SLP): Dr. Rosalin Hawking   Encounter Date: 04/04/2019  End of Session - 04/04/19 2335    Visit Number  4    Number of Visits  17    Date for SLP Re-Evaluation  05/18/19    SLP Start Time  [id number]    SLP Stop Time   1445    SLP Time Calculation (min)  40 min    Activity Tolerance  Patient tolerated treatment well       Past Medical History:  Diagnosis Date  . Hypertension    controlled    Past Surgical History:  Procedure Laterality Date  . ABDOMINAL HYSTERECTOMY    . BACK SURGERY    . BUBBLE STUDY  03/14/2019   Procedure: BUBBLE STUDY;  Surgeon: [name], MD;  Location: [facility];  Service: Cardiovascular;;  . TEE WITHOUT CARDIOVERSION N/A 03/14/2019   Procedure: TRANSESOPHAGEAL ECHOCARDIOGRAM (TEE);  Surgeon: [name] [name], MD;  Location: [facility] ENDOSCOPY;  Service: Cardiovascular;  Laterality: N/A;    There were no vitals filed for this visit.  Subjective Assessment - 04/04/19 1403    Subjective  "all the time." (SLP: "give an example of when word finding was frustrating for you.")    Patient is accompained by:  Family member   Don-husband   Currently in Pain?  No/denies            ADULT SLP TREATMENT - 04/04/19 1412      General Information   Behavior/Cognition  Alert;Cooperative;Pleasant mood      Treatment Provided   Treatment provided  Cognitive-Linquistic      Cognitive-Linquistic Treatment   Treatment focused on  Aphasia    Skilled Treatment  (Aphasia - speech tx) Pt seen with lights off and blinds closed. Pt with mod complex conversation re: deficits WFL/WNL today. With cooking divergent naming, pt named average 4.5 items prior to requirng cues.  (cognition 18 minutes) SLP discussed strategies for pt to recall taking meds - pt to leave meds on table and husband to cue pt if she gets 2/3 of meal gone and hasn't taken any meds yet. SLP and pt/husband problem solved how pt could track her own appointments with pt's decr'd vision, however husband, pt and SLP could not think of feasible option other than pt asking husband. SLP suggested [place] speaker (which pt has) but she was not interested.       Assessment / Recommendations / Plan   Plan  Continue with current plan of care      Progression Toward Goals   Progression toward goals  Progressing toward goals       SLP Education - 04/04/19 2330    Education Details  incr independence with med adminstration, compensations for appointmetnt tracking but pt was not interested.    Person(s) Educated  Patient;Spouse    Methods  Explanation    Comprehension  Verbalized understanding       SLP Short Term Goals - 04/04/19 1429      SLP SHORT TERM GOAL #1   Title  Pt will utilize calendar and med Environmental education officer to recall if she has taken her meds or not, twice a day with min A from spouse over 2 sessions    Time  3  Period  Weeks    Status  On-going      SLP SHORT TERM GOAL #2   Title  Pt will verbally sequence 4-5 steps in chores and meals that she did prior to CVA with rare min A over 2 sessions    Time  3    Period  Weeks    Status  On-going      SLP SHORT TERM GOAL #3   Title  Pt will complete mildly complex naming tasks with occasional min A and 90% accuracy    Time  3    Period  Weeks    Status  On-going      SLP SHORT TERM GOAL #4   Title  Pt and spouse will carryover 2 strategies for processing conversation and instructions outside of therapy per their report over 2 sessions    Time  3    Period  Weeks       SLP Long Term Goals - 04/04/19 1435      SLP LONG TERM GOAL #1   Title  Pt will manage medications 2/3x a day she takes meds with external aids and rare min A  from spouse over 3 sessions    Time  7    Period  Weeks    Status  On-going      SLP LONG TERM GOAL #2   Title  Pt will carryover 3 compensations for attention to complete IADL's with occasional min A from spouse over 2 sessions    Time  7    Period  Weeks    Status  On-going      SLP LONG TERM GOAL #3   Title  Pt will utilize compensations for aphasia in mildly complex 15 minute conversation with rare min A over 3 sessions    Time  7    Period  Weeks    Status  On-going       Plan - 04/04/19 2335    Clinical Impression Statement  Myrtle continues to present with 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 She was functional today in mod complex conversation. Ongoing training for compensations for word finding impairments.  Visual impairments greatly affecting attention and processing. She continues to report cognitive fatigue resulting in headaches. Initiated compensations for attention and cognitive fatigue incluidng using a timer and working for shorter periods of time, taking breaks, and repeating back what she has heard. Continue skilled ST to maximize cognition and communication for improved QOL, independence and safety.    Speech Therapy Frequency  2x / week    Duration  --   8 weeks or 17 visits   Treatment/Interventions  Environmental controls;Language facilitation;Compensatory techniques;Cognitive reorganization;Functional tasks;SLP instruction and feedback;Compensatory strategies;Patient/family education;Multimodal communcation approach;Internal/external aids    Potential to Achieve Goals  Good    Potential Considerations  Ability to learn/carryover information       Patient will benefit from skilled therapeutic intervention in order to improve the following deficits and impairments:   Aphasia  Cognitive communication deficit    Problem List Patient Active Problem List   Diagnosis Date Noted  . CVA (cerebral vascular accident) ([place]) 03/11/2019  .  Hypertensive emergency 03/09/2019  . Hyperlipidemia 03/09/2019  . DM (diabetes mellitus) ([place]) 03/07/2019  . HTN (hypertension) 03/07/2019  . Alcohol use 03/07/2019  . 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

## 2019-04-04 NOTE — Patient Instructions (Signed)
  Please complete the assigned speech therapy homework prior to your next session and return it to the speech therapist at your next visit.  

## 2019-04-06 ENCOUNTER — Ambulatory Visit: Payer: Managed Care, Other (non HMO) | Admitting: Occupational Therapy

## 2019-04-06 ENCOUNTER — Other Ambulatory Visit: Payer: Self-pay

## 2019-04-06 ENCOUNTER — Ambulatory Visit: Payer: Managed Care, Other (non HMO)

## 2019-04-06 DIAGNOSIS — R41844 Frontal lobe and executive function deficit: Secondary | ICD-10-CM

## 2019-04-06 DIAGNOSIS — R41842 Visuospatial deficit: Secondary | ICD-10-CM

## 2019-04-06 DIAGNOSIS — R41841 Cognitive communication deficit: Secondary | ICD-10-CM

## 2019-04-06 DIAGNOSIS — R4701 Aphasia: Secondary | ICD-10-CM

## 2019-04-06 DIAGNOSIS — R41840 Attention and concentration deficit: Secondary | ICD-10-CM

## 2019-04-06 NOTE — Patient Instructions (Signed)
1. Look for the edge of objects (to the left and/or right) so that you make sure you are seeing all of an object, particularly to the right side 2. Turn your head when walking, scan from side to side, particularly in busy environments 3. Use an organized scanning pattern. It's usually easier to scan from top to bottom, and left to right (like you are reading) 4. Double check yourself 5. Use a line guide (like a blank piece of paper) or your finger when reading 6. If necessary, place brightly colored tape at end of table or work area as a reminder to always look until you see the tape.     Activities to try at home to encourage visual scanning:   1. Word searches 2. Mazes 3. Puzzles 4. Card games 5. Computer games and/or searches 6. Connect-the-dots  Activities for environmental (larger) scanning:  1. With supervision, scan for items in grocery store or drugstore.  Begin with a familiar store, then progress to a new store you've never been in before. Make sure you have supervision with this.  2. With supervision, tell a family member or caregiver when it is safe to cross a street after looking all directions and any side streets. However, do NOT cross street unless family member or caregiver is with you and says it is OK

## 2019-04-06 NOTE — Therapy (Signed)
[facility] [address] [place], [place], [address] Phone: [phone]   Fax:  [phone]  Speech Language Pathology Treatment  Patient Details  Name: Belinda Banks MRN: [id number] Date of Birth: Apr 17, 1956 Referring Provider (SLP): Dr. Rosalin Hawking   Encounter Date: 04/06/2019  End of Session - 04/06/19 1237    Visit Number  5    Number of Visits  17    Date for SLP Re-Evaluation  05/18/19    SLP Start Time  1104    SLP Stop Time   1145    SLP Time Calculation (min)  41 min    Activity Tolerance  Patient tolerated treatment well       Past Medical History:  Diagnosis Date  . Hypertension    controlled    Past Surgical History:  Procedure Laterality Date  . ABDOMINAL HYSTERECTOMY    . BACK SURGERY    . BUBBLE STUDY  03/14/2019   Procedure: BUBBLE STUDY;  Surgeon: [name], MD;  Location: [facility];  Service: Cardiovascular;;  . TEE WITHOUT CARDIOVERSION N/A 03/14/2019   Procedure: TRANSESOPHAGEAL ECHOCARDIOGRAM (TEE);  Surgeon: [name] [name], MD;  Location: [facility] ENDOSCOPY;  Service: Cardiovascular;  Laterality: N/A;    There were no vitals filed for this visit.  Subjective Assessment - 04/06/19 1108    Subjective  "Still frustrating  - still not getting them all. But it is what it is."    Patient is accompained by:  Family member   Don-husband   Currently in Pain?  No/denies            ADULT SLP TREATMENT - 04/06/19 1108      General Information   Behavior/Cognition  Alert;Cooperative;Pleasant mood      Treatment Provided   Treatment provided  Cognitive-Linquistic      Cognitive-Linquistic Treatment   Treatment focused on  Cognition    Skilled Treatment  Husband: "It's been a while since you've had a headache." Pt's husband (and pt as well, when thinking more on it) states pt is self regulating when she needs breaks much better than ~2 weeks ago when she would push herself, and  then "crash" with a headache that would debilitate her. SLP had pt perform route-finding tasks for sequential organization - pt with min-mod cues necessary for sequencing and details in her route from home to rehab, however husbnad and pt agreed pt would not have known how to navigate downtown prior to hospitalization. With routes to nearest grocery store and to their [employer], pt was indpendent with route finding. In verbal sequencing of common tasks pt req'd min extra time, rarely.      Assessment / Recommendations / Plan   Plan  Continue with current plan of care      Progression Toward Goals   Progression toward goals  Progressing toward goals         SLP Short Term Goals - 04/06/19 1238      SLP SHORT TERM GOAL #1   Title  Pt will utilize calendar and med Environmental education officer to recall if she has taken her meds or not, twice a day with min A from spouse over 2 sessions    Time  3    Period  Weeks    Status  On-going      SLP SHORT TERM GOAL #2   Title  Pt will verbally sequence 4-5 steps in chores and meals that she did prior to CVA with  rare min A over 2 sessions    Time  3    Period  Weeks    Status  On-going      SLP SHORT TERM GOAL #3   Title  Pt will complete mildly complex naming tasks with occasional min A and 90% accuracy    Time  3    Period  Weeks    Status  On-going      SLP SHORT TERM GOAL #4   Title  Pt and spouse will carryover 2 strategies for processing conversation and instructions outside of therapy per their report over 2 sessions    Time  3    Period  Weeks       SLP Long Term Goals - 04/06/19 1238      SLP LONG TERM GOAL #1   Title  Pt will manage medications 2/3x a day she takes meds with external aids and rare min A from spouse over 3 sessions    Time  7    Period  Weeks    Status  On-going      SLP LONG TERM GOAL #2   Title  Pt will carryover 3 compensations for attention to complete IADL's with occasional min A from spouse over 2  sessions    Time  7    Period  Weeks    Status  On-going      SLP LONG TERM GOAL #3   Title  Pt will utilize compensations for aphasia in mildly complex 15 minute conversation with rare min A over 3 sessions    Time  7    Period  Weeks    Status  On-going       Plan - 04/06/19 1238    Clinical Impression Statement  Belinda Banks continues to present with 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 She was functional today in mod complex conversation. Ongoing training for compensations for word finding impairments.  Visual impairments greatly affecting attention and processing. She continues to report cognitive fatigue resulting in headaches. Initiated compensations for attention and cognitive fatigue incluidng using a timer and working for shorter periods of time, taking breaks, and repeating back what she has heard. Continue skilled ST to maximize cognition and communication for improved QOL, independence and safety.    Speech Therapy Frequency  2x / week    Duration  --   8 weeks or 17 visits   Treatment/Interventions  Environmental controls;Language facilitation;Compensatory techniques;Cognitive reorganization;Functional tasks;SLP instruction and feedback;Compensatory strategies;Patient/family education;Multimodal communcation approach;Internal/external aids    Potential to Achieve Goals  Good    Potential Considerations  Ability to learn/carryover information       Patient will benefit from skilled therapeutic intervention in order to improve the following deficits and impairments:   Aphasia  Cognitive communication deficit    Problem List Patient Active Problem List   Diagnosis Date Noted  . CVA (cerebral vascular accident) ([facility]) 03/11/2019  . Hypertensive emergency 03/09/2019  . Hyperlipidemia 03/09/2019  . DM (diabetes mellitus) ([facility]) 03/07/2019  . HTN (hypertension) 03/07/2019  . Alcohol use 03/07/2019  . 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 [place], [place], [address] Phone: [phone]   Fax:  [phone]   Name: Belinda Banks MRN: [id number] Date of Birth: February 26, 1956

## 2019-04-06 NOTE — Therapy (Signed)
[facility] [address] [place], [place], [address] Phone: [phone]   Fax:  [phone]  Occupational Therapy Treatment  Patient Details  Name: Belinda Banks MRN: [id number] Date of Birth: 21-Dec-1955 Referring Provider (OT): Dr. Erlinda Hong   Encounter Date: 04/06/2019  OT End of Session - 04/06/19 1355    Visit Number  2    Number of Visits  25    Date for OT Re-Evaluation  06/21/19    Authorization Type  [employer]    Authorization Time Period  60 days    Authorization - Visit Number  2    Authorization - Number of Visits  30    OT Start Time  1151    OT Stop Time  1230    OT Time Calculation (min)  39 min    Activity Tolerance  Patient tolerated treatment well    Behavior During Therapy  --       Past Medical History:  Diagnosis Date  . Hypertension    controlled    Past Surgical History:  Procedure Laterality Date  . ABDOMINAL HYSTERECTOMY    . BACK SURGERY    . BUBBLE STUDY  03/14/2019   Procedure: BUBBLE STUDY;  Surgeon: [name], MD;  Location: [facility];  Service: Cardiovascular;;  . TEE WITHOUT CARDIOVERSION N/A 03/14/2019   Procedure: TRANSESOPHAGEAL ECHOCARDIOGRAM (TEE);  Surgeon: [name] [name], MD;  Location: [facility] ENDOSCOPY;  Service: Cardiovascular;  Laterality: N/A;    There were no vitals filed for this visit.  Subjective Assessment - 04/06/19 1402    Subjective   Pt denies pain    Currently in Pain?  No/denies    Pain Onset  1 to 4 weeks ago          Treatment: Pt performed 25M number cancellation with min v.c Then telephone copying task was completed accurately Pt read several paragraphs of larger print info with min v.c prior to fatigue.                   OT Education - 04/06/19 1358    Education Details  Pt and husband were educated in compensatory strategies for visual perceptual deficits, and vision HEP-see pt instructions    Person(s) Educated   Patient;Spouse    Methods  Explanation;Demonstration;Verbal cues;Handout    Comprehension  Verbalized understanding;Returned demonstration;Verbal cues required       OT Short Term Goals - 03/23/19 1730      OT SHORT TERM GOAL #1   Title  Pt/ caregiver will be I with inital HEP.due 05/07/2019    Time  6    Period  Weeks    Status  New    Target Date  05/07/19      OT SHORT TERM GOAL #2   Title  Pt./caregiver will verbalize understanding of visual compensation strategies    Time  6    Period  Weeks      OT SHORT TERM GOAL #3   Title  Pt will perform tabletop scanning activities with 75% accuracy and min v.c    Time  6    Period  Weeks    Status  New      OT SHORT TERM GOAL #4   Title  Pt will demonstrate improved fine motor coordination for ADLS as evidenced by decreasing 9 hole peg test scaore to 50 secs or less.    Time  6    Period  Weeks  Status  New      OT SHORT TERM GOAL #5   Title  Pt will navigate a  minimally distracting environment and locate items with 75% or better accuracy.    Time  6    Period  Weeks    Status  New      Additional Short Term Goals   Additional Short Term Goals  Yes      OT SHORT TERM GOAL #6   Title  Pt will demonstrate ability to read a large print sentence with no more than min v.c    Time  6    Period  Weeks    Status  New      OT SHORT TERM GOAL #7   Title  Pt will perform simple familiar home managment tasks with no more than min A.    Time  6    Period  Weeks    Status  New        OT Long Term Goals - 03/23/19 1735      OT LONG TERM GOAL #1   Title  Pt will be modified indpendent with all basic ADLs.-06/21/2019    Time  12    Period  Weeks    Status  New    Target Date  06/21/19      OT LONG TERM GOAL #2   Title  Pt will perform mod complex home management  with supervision, min v.c    Time  12    Period  Weeks    Status  New      OT LONG TERM GOAL #3   Title  Pt will perfrom basic cooking with min A    Time   12    Period  Weeks    Status  New      OT LONG TERM GOAL #4   Title  Pt will demonstrate ability to read a short large print paragraph with no more than min v.c    Time  12    Period  Weeks    Status  New      OT LONG TERM GOAL #5   Title  Pt will perform tabletop scanning activities with 90% or better accuracy.    Time  12    Period  Weeks    Status  New      OT LONG TERM GOAL #6   Title  Pt will perform environmental scanning with 90% or better accuracy in a mod distracting environment.    Time  12    Period  Weeks    Status  New            Plan - 04/06/19 1356    Clinical Impression Statement  Pt. is progressing towards goals. She was able to read larger print today while earing her reading glasses and using line guide with min v.c    OT Occupational Profile and History  Detailed Assessment- Review of Records and additional review of physical, cognitive, psychosocial history related to current functional performance    Occupational performance deficits (Please refer to evaluation for details):  ADL's;IADL's;Work;Leisure;Social Participation    Body Structure / Function / Physical Skills  ADL;UE functional use;Flexibility;Vision;FMC;Cardiopulmonary status limiting activity;Balance;Continence;Gait;ROM;GMC;Coordination;Decreased knowledge of precautions;IADL;Decreased knowledge of use of DME;Dexterity;Mobility;Strength    Rehab Potential  Good    Clinical Decision Making  Several treatment options, min-mod task modification necessary    Comorbidities Affecting Occupational Performance:  May have comorbidities impacting occupational performance  Modification or Assistance to Complete Evaluation   Min-Moderate modification of tasks or assist with assess necessary to complete eval   due to visual and cognitive deficits   OT Frequency  2x / week    OT Duration  12 weeks    OT Treatment/Interventions  Self-care/ADL training;Visual/perceptual  remediation/compensation;Patient/family education;Aquatic Therapy;DME and/or AE instruction;Paraffin;Passive range of motion;Balance training;Cryotherapy;Fluidtherapy;Functional Mobility Training;Moist Heat;Therapeutic exercise;Manual Therapy;Therapeutic activities;Neuromuscular education;Cognitive remediation/compensation    Plan  HEP for coordination, visual perceptual skills.    Consulted and Agree with Plan of Care  Patient;Family member/caregiver    Family Member Consulted  husband       Patient will benefit from skilled therapeutic intervention in order to improve the following deficits and impairments:   Body Structure / Function / Physical Skills: ADL, UE functional use, Flexibility, Vision, FMC, Cardiopulmonary status limiting activity, Balance, Continence, Gait, ROM, GMC, Coordination, Decreased knowledge of precautions, IADL, Decreased knowledge of use of DME, Dexterity, Mobility, Strength       Visit Diagnosis: Visuospatial deficit  Frontal lobe and executive function deficit  Attention and concentration deficit    Problem List Patient Active Problem List   Diagnosis Date Noted  . CVA (cerebral vascular accident) ([facility]) 03/11/2019  . Hypertensive emergency 03/09/2019  . Hyperlipidemia 03/09/2019  . DM (diabetes mellitus) ([place]) 03/07/2019  . HTN (hypertension) 03/07/2019  . Alcohol use 03/07/2019  . 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

## 2019-04-06 NOTE — Patient Instructions (Signed)
  Please complete the assigned speech therapy homework prior to your next session and return it to the speech therapist at your next visit.  

## 2019-04-11 ENCOUNTER — Ambulatory Visit: Payer: Managed Care, Other (non HMO) | Admitting: Speech Pathology

## 2019-04-11 ENCOUNTER — Encounter: Payer: Self-pay | Admitting: Speech Pathology

## 2019-04-11 ENCOUNTER — Other Ambulatory Visit: Payer: Self-pay

## 2019-04-11 DIAGNOSIS — R41841 Cognitive communication deficit: Secondary | ICD-10-CM

## 2019-04-11 DIAGNOSIS — R4701 Aphasia: Secondary | ICD-10-CM

## 2019-04-11 DIAGNOSIS — R41842 Visuospatial deficit: Secondary | ICD-10-CM | POA: Diagnosis not present

## 2019-04-11 NOTE — Therapy (Signed)
[facility] [address] [place], [place], [address] Phone: [phone]   Fax:  [phone]  Speech Language Pathology Treatment  Patient Details  Name: Belinda Banks MRN: [id number] Date of Birth: [birth year] Referring Provider (SLP): Dr. Rosalin Hawking   Encounter Date: 04/11/2019  End of Session - 04/11/19 1224    Visit Number  6    Number of Visits  17    Date for SLP Re-Evaluation  05/18/19    SLP Start Time  0929    SLP Stop Time   1013    SLP Time Calculation (min)  44 min    Activity Tolerance  Patient tolerated treatment well       Past Medical History:  Diagnosis Date  . Hypertension    controlled    Past Surgical History:  Procedure Laterality Date  . ABDOMINAL HYSTERECTOMY    . BACK SURGERY    . BUBBLE STUDY  03/14/2019   Procedure: BUBBLE STUDY;  Surgeon: [name], MD;  Location: [facility];  Service: Cardiovascular;;  . TEE WITHOUT CARDIOVERSION N/A 03/14/2019   Procedure: TRANSESOPHAGEAL ECHOCARDIOGRAM (TEE);  Surgeon: [name] [name], MD;  Location: [facility] ENDOSCOPY;  Service: Cardiovascular;  Laterality: N/A;    There were no vitals filed for this visit.  Subjective Assessment - 04/11/19 0939    Subjective  "I havent' had a headache - I can take a break and feel better"    Currently in Pain?  No/denies            ADULT SLP TREATMENT - 04/11/19 0947      General Information   Behavior/Cognition  Alert;Cooperative;Pleasant mood      Cognitive-Linquistic Treatment   Treatment focused on  Cognition;Patient/family/caregiver education    Skilled Treatment  Pt and spouse report pt independent with med management using calendar and med Environmental education officer. Maeley is no longer repeatedly asking her husband if she has taken her meds. In [place] community tasks, reading with enlarged print, pt sequenced 4-5 steps in tasks with mod I. After 15 minutes, she noted her eyes began to bother her. She  took a 3 minute break with eyes closed. Spouse reports setting a timer for 15 minutes of ST HW, then immediately doing OT visual HW, which resulted in fatigue. ST re-educated pt and sposue that 15 minutes timer is for all tasks, household chores and all rehab HW, especially when she is using her eyes. Irisha liked to cook prior to CVA. She attempted to chop vegetables, but realized she could not see them. Leshay verbalized sadness that she can't cook at this time. We collaborated and decided that Kalayah could gather ingredients, utensils and pots and pans prior to her husband cooking, taking breaks as needed. Libbey also enjoyed crossword puzzles, which would benefit her word finding. I provided easy, large print crosswords. To facilitate visual processing, we highlighted the across pink numbers and the down numbers in yellow. Instructed Sinthia to cover all but the line she is reading with a blank paper. Demonstrated this and Juliannie affirmed it helped. Trialed the reading focus card - Nasirah stated it "really helps me read" Jarrell continues to report frustration due to word finding diffiuclty, and giving up in conversation. Reviewed compensatory strategies for word finidng. Pt demonstrated them in structured task with success and rare min A. Modeled for spouse questioning cues to facilitate Lavon's carryover of compensations in conversation to reduce frustration.      Assessment / Recommendations / Plan  Plan  Continue with current plan of care      Progression Toward Goals   Progression toward goals  Progressing toward goals       SLP Education - 04/11/19 1220    Education Details  compensations for aphasia and visual processing of crosswords       SLP Short Term Goals - 04/11/19 1223      SLP SHORT TERM GOAL #1   Title  Pt will utilize calendar and med Environmental education officer to recall if she has taken her meds or not, twice a day with min A from spouse over 2 sessions    Time  3    Period  Weeks    Status  Achieved       SLP SHORT TERM GOAL #2   Title  Pt will verbally sequence 4-5 steps in chores and meals that she did prior to CVA with rare min A over 2 sessions    Time  3    Period  Weeks    Status  Achieved      SLP SHORT TERM GOAL #3   Title  Pt will complete mildly complex naming tasks with occasional min A and 90% accuracy    Time  2    Period  Weeks    Status  On-going      SLP SHORT TERM GOAL #4   Title  Pt and spouse will carryover 2 strategies for processing conversation and instructions outside of therapy per their report over 2 sessions    Time  2    Period  Weeks       SLP Long Term Goals - 04/11/19 1224      SLP LONG TERM GOAL #1   Title  Pt will manage medications 2/3x a day she takes meds with external aids and rare min A from spouse over 3 sessions    Time  6    Period  Weeks    Status  On-going      SLP LONG TERM GOAL #2   Title  Pt will carryover 3 compensations for attention to complete IADL's with occasional min A from spouse over 2 sessions    Time  6    Period  Weeks    Status  On-going      SLP LONG TERM GOAL #3   Title  Pt will utilize compensations for aphasia in mildly complex 15 minute conversation with rare min A over 3 sessions    Time  6    Period  Weeks    Status  On-going       Plan - 04/11/19 1220    Clinical Impression Statement  Maxie has demonstrated improvement in cognitive communication, carrying over strategies for visual processing and utilizing sensory breaks and timers to reduce headaches and cognitive fatigue - she reports no headaches in the past week. She contintues to report frustration with word finding difficulties. Mod A to carryover compensations for word finding. Continue skiled ST to maximxize cognition and communication for improved QOL and safety.    Speech Therapy Frequency  2x / week    Duration  --   8 weeks or 17 visits   Treatment/Interventions  Environmental controls;Language facilitation;Compensatory techniques;Cognitive  reorganization;Functional tasks;SLP instruction and feedback;Compensatory strategies;Patient/family education;Multimodal communcation approach;Internal/external aids    Potential to Achieve Goals  Good       Patient will benefit from skilled therapeutic intervention in order to improve the following deficits and impairments:   Cognitive communication deficit  Aphasia    Problem List Patient Active Problem List   Diagnosis Date Noted  . CVA (cerebral vascular accident) ([facility]) 03/11/2019  . Hypertensive emergency 03/09/2019  . Hyperlipidemia 03/09/2019  . DM (diabetes mellitus) ([facility]) 03/07/2019  . HTN (hypertension) 03/07/2019  . Alcohol use 03/07/2019  . 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

## 2019-04-11 NOTE — Patient Instructions (Addendum)
  The 15 minute time limit is for all chores or tasks with your eyes - ST, OT, reading, jig saws, folding etc  Read[employer] for reading focus cards  Encourage Belinda Banks to describe the word she can't think of. You can say "tell me about that word" ask questions such as: what does it (or he) look like? What do we do with it? Where do we keep it? Etc  Color code across and down on the crosswords - maybe start by only doing 3 clues at a time - no more than 15 minutes  Start helping in the kitchen by getting out ingredients, utensils, pots etcs. Take a break after 15 minutes, then go back  After a week or so, try to set timer for 20 minutes and see how it goes

## 2019-04-13 ENCOUNTER — Encounter: Payer: Self-pay | Admitting: Speech Pathology

## 2019-04-13 ENCOUNTER — Ambulatory Visit: Payer: Managed Care, Other (non HMO) | Admitting: Speech Pathology

## 2019-04-13 ENCOUNTER — Other Ambulatory Visit: Payer: Self-pay

## 2019-04-13 DIAGNOSIS — R41841 Cognitive communication deficit: Secondary | ICD-10-CM

## 2019-04-13 DIAGNOSIS — R41842 Visuospatial deficit: Secondary | ICD-10-CM | POA: Diagnosis not present

## 2019-04-13 DIAGNOSIS — R4701 Aphasia: Secondary | ICD-10-CM

## 2019-04-13 NOTE — Therapy (Signed)
[facility] [address] [place], [place], [address] Phone: [phone]   Fax:  [phone]  Speech Language Pathology Treatment  Patient Details  Name: Belinda Banks MRN: [id number] Date of Birth: 1956-03-20 Referring Provider (SLP): Dr. Rosalin Hawking   Encounter Date: 04/13/2019  End of Session - 04/13/19 1030    Visit Number  7    Number of Visits  17    Date for SLP Re-Evaluation  05/18/19    SLP Start Time  0930    SLP Stop Time   1016    SLP Time Calculation (min)  46 min    Activity Tolerance  Patient tolerated treatment well       Past Medical History:  Diagnosis Date  . Hypertension    controlled    Past Surgical History:  Procedure Laterality Date  . ABDOMINAL HYSTERECTOMY    . BACK SURGERY    . BUBBLE STUDY  03/14/2019   Procedure: BUBBLE STUDY;  Surgeon: [name], MD;  Location: [facility];  Service: Cardiovascular;;  . TEE WITHOUT CARDIOVERSION N/A 03/14/2019   Procedure: TRANSESOPHAGEAL ECHOCARDIOGRAM (TEE);  Surgeon: [name] [name], MD;  Location: [facility] ENDOSCOPY;  Service: Cardiovascular;  Laterality: N/A;    There were no vitals filed for this visit.  Subjective Assessment - 04/13/19 0935    Subjective  "She says her eyes are worse in the morning, then they get better"    Patient is accompained by:  Family member   Belinda Banks, husband   Currently in Pain?  No/denies            ADULT SLP TREATMENT - 04/13/19 0936      General Information   Behavior/Cognition  Alert;Cooperative;Pleasant mood      Treatment Provided   Treatment provided  Cognitive-Linquistic      Cognitive-Linquistic Treatment   Treatment focused on  Cognition;Patient/family/caregiver education;Aphasia    Skilled Treatment  Belinda Banks continues to report reduced visual processing exacerbated with cognitive or physical fatigue. She continues to take sensory breaks as needed to support her vision as well as attention  and concentration. Belinda Banks required frequent mod A (verbal cues, instructions, demonstration) to verbalize compensations for aphasia. Targeted verbal  compensation for aphasia in structured task, IDing mid to low frequency words and describing them. Belinda Banks required rare min A in structured task. Her sposue is to encourage her to use compensations as needed in conversation at home. Belinda Banks 12 items with mod I in mildly complex categorie. In moderately complex naming task , naming item in category that starts with a specific letter, Belinda Banks required usual min semantic cues.       Assessment / Recommendations / Plan   Plan  Continue with current plan of care      Progression Toward Goals   Progression toward goals  Progressing toward goals       SLP Education - 04/13/19 1028    Education Details  continue to take breaks as needed before your vision becomes completely debilitated; carryover compensations for aphasia    Person(s) Educated  Patient;Spouse    Methods  Explanation;Demonstration;Verbal cues    Comprehension  Verbalized understanding;Returned demonstration;Verbal cues required;Need further instruction       SLP Short Term Goals - 04/13/19 1029      SLP SHORT TERM GOAL #1   Title  Pt will utilize calendar and med Environmental education officer to recall if she has taken her meds or not, twice a day with min A  from spouse over 2 sessions    Time  3    Period  Weeks    Status  Achieved      SLP SHORT TERM GOAL #2   Title  Pt will verbally sequence 4-5 steps in chores and meals that she did prior to CVA with rare min A over 2 sessions    Time  3    Period  Weeks    Status  Achieved      SLP SHORT TERM GOAL #3   Title  Pt will complete mildly complex naming tasks with occasional min A and 90% accuracy    Time  2    Period  Weeks    Status  Achieved      SLP SHORT TERM GOAL #4   Title  Pt and spouse will carryover 2 strategies for processing conversation and instructions outside of therapy per their  report over 2 sessions    Time  2    Period  Weeks       SLP Long Term Goals - 04/13/19 1029      SLP LONG TERM GOAL #1   Title  Pt will manage medications 2/3x a day she takes meds with external aids and rare min A from spouse over 3 sessions    Time  6    Period  Weeks    Status  On-going      SLP LONG TERM GOAL #2   Title  Pt will carryover 3 compensations for attention to complete IADL's with occasional min A from spouse over 2 sessions    Time  6    Period  Weeks    Status  On-going      SLP LONG TERM GOAL #3   Title  Pt will utilize compensations for aphasia in mildly complex 15 minute conversation with rare min A over 3 sessions    Time  6    Period  Weeks    Status  On-going       Plan - 04/13/19 1029    Clinical Impression Statement  Belinda Banks has demonstrated improvement in cognitive communication, carrying over strategies for visual processing and utilizing sensory breaks and timers to reduce headaches and cognitive fatigue - she reports no headaches in the past week. She contintues to report frustration with word finding difficulties. Mod A to carryover compensations for word finding. Continue skiled ST to maximxize cognition and communication for improved QOL and safety.       Patient will benefit from skilled therapeutic intervention in order to improve the following deficits and impairments:   Cognitive communication deficit  Aphasia    Problem List Patient Active Problem List   Diagnosis Date Noted  . CVA (cerebral vascular accident) ([facility]) 03/11/2019  . Hypertensive emergency 03/09/2019  . Hyperlipidemia 03/09/2019  . DM (diabetes mellitus) ([facility]) 03/07/2019  . HTN (hypertension) 03/07/2019  . Alcohol use 03/07/2019  . 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

## 2019-04-18 ENCOUNTER — Other Ambulatory Visit: Payer: Self-pay

## 2019-04-18 ENCOUNTER — Ambulatory Visit: Payer: Managed Care, Other (non HMO)

## 2019-04-18 DIAGNOSIS — R41842 Visuospatial deficit: Secondary | ICD-10-CM | POA: Diagnosis not present

## 2019-04-18 DIAGNOSIS — R41841 Cognitive communication deficit: Secondary | ICD-10-CM

## 2019-04-18 DIAGNOSIS — R4701 Aphasia: Secondary | ICD-10-CM

## 2019-04-18 NOTE — Patient Instructions (Signed)
  Please complete the assigned speech therapy homework prior to your next session and return it to the speech therapist at your next visit.  

## 2019-04-18 NOTE — Therapy (Signed)
[facility] [address] [place], [place], [address] Phone: [phone]   Fax:  [phone]  Speech Language Pathology Treatment  Patient Details  Name: Belinda Banks MRN: [id number] Date of Birth: [birth year] Referring Provider (SLP): Dr. Rosalin Hawking   Encounter Date: 04/18/2019  End of Session - 04/18/19 1355    Visit Number  8    Number of Visits  17    Date for SLP Re-Evaluation  05/18/19    SLP Start Time  1107    SLP Stop Time   1146    SLP Time Calculation (min)  39 min    Activity Tolerance  Patient tolerated treatment well       Past Medical History:  Diagnosis Date  . Hypertension    controlled    Past Surgical History:  Procedure Laterality Date  . ABDOMINAL HYSTERECTOMY    . BACK SURGERY    . BUBBLE STUDY  03/14/2019   Procedure: BUBBLE STUDY;  Surgeon: [name], MD;  Location: [facility];  Service: Cardiovascular;;  . TEE WITHOUT CARDIOVERSION N/A 03/14/2019   Procedure: TRANSESOPHAGEAL ECHOCARDIOGRAM (TEE);  Surgeon: [name] [name], MD;  Location: [facility] ENDOSCOPY;  Service: Cardiovascular;  Laterality: N/A;    There were no vitals filed for this visit.  Subjective Assessment - 04/18/19 1109    Patient is accompained by:  Family member   Belinda Banks- husband   Currently in Pain?  No/denies            ADULT SLP TREATMENT - 04/18/19 1110      General Information   Behavior/Cognition  Alert;Cooperative;Pleasant mood      Treatment Provided   Treatment provided  Cognitive-Linquistic      Cognitive-Linquistic Treatment   Treatment focused on  Patient/family/caregiver education    Skilled Treatment  Husband and pt agree pt is not having difficulty comprehending in conversation at this time, nor comprehending any longer verbal information. Pt reports cont'd major difficulty with her eyes - sees Belinda Banks Wednesday - husband tells SLP that she will say whether or not pt needs to see  neuropthamologist. Husband reports pt has not had many anomic episodes since last session. Pt tells SLP that if she does have difficulty with anomia, she asks huband to give her hints or desribes it to her. SLP engaged pt with multiple meaning words today - pt req'd extra time usually, and usual min-mod cues to complete definitions adequately. Pt cont to tell SLP she is not at baseline with verbal expression - husband agrees. (cognitive 14 minutes) Pt and husband tell SLP that pt's attention last night making dinner was good/excellent with the use of timers and taking breaks- husband states (pt agrees) that pt's biggest issue last night was her vision, and when this worsened she called husband to assist her.       Assessment / Recommendations / Plan   Plan  Continue with current plan of care      Progression Toward Goals   Progression toward goals  Progressing toward goals         SLP Short Term Goals - 04/18/19 1125      SLP SHORT TERM GOAL #1   Title  Pt will utilize calendar and med Environmental education officer to recall if she has taken her meds or not, twice a day with min A from spouse over 2 sessions    Status  Achieved      SLP SHORT TERM GOAL #2  Title  Pt will verbally sequence 4-5 steps in chores and meals that she did prior to CVA with rare min A over 2 sessions    Status  Achieved      SLP SHORT TERM GOAL #3   Title  Pt will complete mildly complex naming tasks with occasional min A and 90% accuracy    Status  Achieved      SLP SHORT TERM GOAL #4   Title  Pt and spouse will carryover 2 strategies for processing conversation and instructions outside of therapy per their report over 2 sessions    Time  --    Period  --    Status  Achieved       SLP Long Term Goals - 04/18/19 1131      SLP LONG TERM GOAL #1   Title  Pt will manage medications 2/3x a day she takes meds with external aids and rare min A from spouse over 3 sessions    Baseline  04-18-19    Time  5    Period  Weeks     Status  On-going      SLP LONG TERM GOAL #2   Title  Pt will carryover 3 compensations for attention to complete IADL's with occasional min A from spouse over 2 sessions    Baseline  04-18-19    Time  5    Period  Weeks    Status  On-going      SLP LONG TERM GOAL #3   Title  Pt will utilize compensations for aphasia in mildly complex 15 minute conversation with rare min A over 3 sessions    Time  5    Period  Weeks    Status  On-going       Plan - 04/18/19 1355    Clinical Impression Statement  Belinda Banks reports carrying over strategies for visual processing and utilizing sensory breaks and timers to reduce headaches and cognitive fatigue. She contintues to report frustration with word finding difficulties, however she tell SLP these are happening less. Continue skiled ST to maximxize cognition and communication for improved QOL and safety.    Speech Therapy Frequency  2x / week    Duration  --   8 weeks or 17 sessions   Treatment/Interventions  Environmental controls;Language facilitation;Compensatory techniques;Cognitive reorganization;Functional tasks;SLP instruction and feedback;Compensatory strategies;Patient/family education;Multimodal communcation approach;Internal/external aids    Potential to Achieve Goals  Good       Patient will benefit from skilled therapeutic intervention in order to improve the following deficits and impairments:   Cognitive communication deficit  Aphasia    Problem List Patient Active Problem List   Diagnosis Date Noted  . CVA (cerebral vascular accident) ([facility]) 03/11/2019  . Hypertensive emergency 03/09/2019  . Hyperlipidemia 03/09/2019  . DM (diabetes mellitus) ([facility]) 03/07/2019  . HTN (hypertension) 03/07/2019  . Alcohol use 03/07/2019  . 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 [place], [place], [address] Phone: [phone]   Fax:  [phone]   Name: Belinda Banks MRN: [id number] Date of Birth: [birth year]

## 2019-04-20 ENCOUNTER — Ambulatory Visit: Payer: Managed Care, Other (non HMO)

## 2019-04-20 ENCOUNTER — Ambulatory Visit: Payer: Managed Care, Other (non HMO) | Admitting: Adult Health

## 2019-04-20 ENCOUNTER — Ambulatory Visit: Payer: Managed Care, Other (non HMO) | Admitting: Occupational Therapy

## 2019-04-20 ENCOUNTER — Encounter: Payer: Self-pay | Admitting: Adult Health

## 2019-04-20 ENCOUNTER — Other Ambulatory Visit: Payer: Self-pay

## 2019-04-20 VITALS — BP 117/79 | HR 74 | Temp 96.8°F | Ht 68.0 in | Wt 181.6 lb

## 2019-04-20 DIAGNOSIS — R253 Fasciculation: Secondary | ICD-10-CM

## 2019-04-20 DIAGNOSIS — I610 Nontraumatic intracerebral hemorrhage in hemisphere, subcortical: Secondary | ICD-10-CM | POA: Diagnosis not present

## 2019-04-20 DIAGNOSIS — H539 Unspecified visual disturbance: Secondary | ICD-10-CM

## 2019-04-20 DIAGNOSIS — R41842 Visuospatial deficit: Secondary | ICD-10-CM

## 2019-04-20 DIAGNOSIS — I69398 Other sequelae of cerebral infarction: Secondary | ICD-10-CM

## 2019-04-20 DIAGNOSIS — M6281 Muscle weakness (generalized): Secondary | ICD-10-CM

## 2019-04-20 DIAGNOSIS — R41844 Frontal lobe and executive function deficit: Secondary | ICD-10-CM

## 2019-04-20 DIAGNOSIS — I639 Cerebral infarction, unspecified: Secondary | ICD-10-CM | POA: Diagnosis not present

## 2019-04-20 DIAGNOSIS — I10 Essential (primary) hypertension: Secondary | ICD-10-CM

## 2019-04-20 DIAGNOSIS — E785 Hyperlipidemia, unspecified: Secondary | ICD-10-CM

## 2019-04-20 DIAGNOSIS — E119 Type 2 diabetes mellitus without complications: Secondary | ICD-10-CM

## 2019-04-20 DIAGNOSIS — R41841 Cognitive communication deficit: Secondary | ICD-10-CM

## 2019-04-20 DIAGNOSIS — R278 Other lack of coordination: Secondary | ICD-10-CM

## 2019-04-20 DIAGNOSIS — R7989 Other specified abnormal findings of blood chemistry: Secondary | ICD-10-CM | POA: Diagnosis not present

## 2019-04-20 DIAGNOSIS — R41840 Attention and concentration deficit: Secondary | ICD-10-CM

## 2019-04-20 NOTE — Progress Notes (Signed)
[facility] [address] [place]. [place] [address] [phone]       HOSPITAL FOLLOW UP NOTE  [name] Date of Birth:  12/06/1955 Medical Record Number:  [id number]   Reason for Referral:  hospital stroke follow up    CHIEF COMPLAINT:  Chief Complaint  Patient presents with  . Hospitalization Follow-up    Husband present. Treatment room. Patient mentioned that she has been having issues with her eye sight. She stated that her left arm she has been having some numbness and jerking after she washes her arm.     HPI: Belinda Banks being seen today for in office hospital follow-up regarding [place] on 03/05/2019 and multiple embolic infarcts on 44/81/8563.  History obtained from patient, husband and chart review. Reviewed all radiology images and labs personally.  Belinda Banks a 63 y.o.femalewith history of HTN(no recent medical f/u) who presented to [facility] ED as a code stroke with c/o slurred speech, right sided weakness, facial droop, BP 230/158 and glucose 238. CT head showed small ICH centered at the left lentiform nucleus estimated volume 3.8 cc felt secondary to hypertensive.  CTA head/neck unchanged size of hypertensive hemorrhage in left basal ganglia without evidence of aneurysm or AVM.  2D echo normal EF without cardiac source of embolus identified.  COVID-19 negative.  Hypertensive emergency on arrival treated with Cleviprex with increasing Cozaar, initiating labetalol and continuation of Norvasc.   Alcohol abuse 4-6 drinks per day placed on B1/FA/MVI and advised to drink no more than one alcoholic beverage per day.  New diagnosis of uncontrolled DM with A1c 7.6 and initiated Metformin 500 mg twice daily and advised to follow-up with PCP outpatient.  LDL 106 and initiated atorvastatin 20 mg daily.  Other stroke risk factors include former tobacco use and sedentary lifestyle.  Residual deficits of right facial droop, right pronator drift with  decreased right hand dexterity and discharged home in stable condition with outpatient therapies.  She returned on 03/11/2019 with complaints of generalized weakness, nausea and slight confusion.  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  MRI w/wo contrast showed multiple acute to early subacute cerebral infarcts bilaterally largest in posterior left MCA region, stable appearance of subacute hemorrhage, and moderate chronic small vessel ischemic disease with multiple chronic lacunar infarcts.  CTA head and neck and 2D echo not repeated as performed on prior admission.  Underwent TEE and was negative for vegetations, embolus or PFO.  Discontinued atorvastatin due to elevated LFTs.  Evaluated by Dr. Leonie Man who felt initial infarct was hemorrhagic rather than primary intracerebral hemorrhage.  Recommended initiating aspirin 81 mg daily for stroke prevention.  Felt to be not a candidate for long-term anticoagulation at that time but may consider loop recorder placement in the future for further embolic work-up.  Vasculitis work-up pending at discharge.  Residual deficits of right peripheral partial field loss, mild right lower facial asymmetry, diminished fine finger movements on the right, mild weakness of right grip and mild right hip flexor and ankle dorsiflexion weakness.  Discharged home with recommendation of outpatient therapy.  Belinda Banks is a 63 year old female who is being seen today for hospital follow-up accompanied by her husband.  Residual deficits of visual impairment, cognitive impairment, aphasia and decreased right hand dexterity.  She has difficulty fully explaining visual impairment as she denies diplopia or blurred vision but describes it as distortion of  images and words which is worsened if she is concentrating for prolonged period of time.   Vision will improve after closing and resting eyes.  She also endorses occasional visual hallucinations.  She has not had evaluation by ophthalmology at this time and is requesting evaluation by neuro-ophthalmology as recommended by therapy.  She continues to work with outpatient speech and OT with ongoing improvement.  She has not been able to return to work due to [employer] of job requires computer and paperwork.  She is currently receiving short-term disability from hospitalization and is requesting continuation of short-term disability through our office.  She has not established care with PCP at this time but does have initial appointment in approximately 1 month.  She also endorses occasional progressive symptoms of left hand numbness slowly progressing up arm and then will begin tremoring lasting 30 seconds to 1 minute.  Usually occurs after activity especially after shower.  She has continued on aspirin 81 mg daily without bleeding or bruising.  She has not had repeat LFTs as she does not have established PCP at this time therefore currently not on statin therapy.  Blood pressure today satisfactory at 117/79.  Review of vasculitis work-up all unremarkable.  She reports decreasing EtOH use and has only had approximately 3 drinks since discharge.  No further concerns at this time.     ROS:   14 system review of systems performed and negative with exception of weakness, visual impairment, weight loss, numbness, decreased energy, change in appetite and hallucinations  PMH:  Past Medical History:  Diagnosis Date  . Hypertension    controlled    PSH:  Past Surgical History:  Procedure Laterality Date  . ABDOMINAL HYSTERECTOMY    . BACK SURGERY    . BUBBLE STUDY  03/14/2019   Procedure: BUBBLE STUDY;  Surgeon: [name], MD;  Location: [facility];  Service: Cardiovascular;;  . TEE WITHOUT CARDIOVERSION N/A 03/14/2019   Procedure: TRANSESOPHAGEAL ECHOCARDIOGRAM (TEE);  Surgeon: [name]  [name], MD;  Location: [facility] ENDOSCOPY;  Service: Cardiovascular;  Laterality: N/A;    Social History:  Social History   Socioeconomic History  . Marital status: Married    Spouse name: Not on file  . Number of children: Not on file  . Years of education: Not on file  . Highest education level: Not on file  Occupational History  . Not on file  Tobacco Use  . Smoking status: Former Research scientist (life sciences)  . Smokeless tobacco: Never Used  Substance and Sexual Activity  . Alcohol use: Yes    Alcohol/week: 6.0 standard drinks    Types: 3 Glasses of wine, 3 Standard drinks or equivalent per week    Comment: 5-6 drinks a day   . Drug use: Never  . Sexual activity: Not on file  Other Topics Concern  . Not on file  Social History Narrative  . Not on file   Social Determinants of Health   Financial Resource Strain:   . Difficulty of Paying Living Expenses: Not on file  Food Insecurity:   . Worried About Charity fundraiser in the Last Year: Not on file  . Ran Out of Food in the Last Year: Not on file  Transportation Needs:   . Lack of Transportation (Medical): Not on file  . Lack of Transportation (Non-Medical): Not on file  Physical Activity:   . Days of Exercise per Week: Not on file  . Minutes of Exercise per Session: Not on file  Stress:   .  Feeling of Stress : Not on file  Social Connections:   . Frequency of Communication with Friends and Family: Not on file  . Frequency of Social Gatherings with Friends and Family: Not on file  . Attends Religious Services: Not on file  . Active Member of Clubs or Organizations: Not on file  . Attends Archivist Meetings: Not on file  . Marital Status: Not on file  Intimate Partner Violence:   . Fear of Current or Ex-Partner: Not on file  . Emotionally Abused: Not on file  . Physically Abused: Not on file  . Sexually Abused: Not on file    Family History: History reviewed. No pertinent family history.  Medications:   Current  Outpatient Medications on File Prior to Visit  Medication Sig Dispense Refill  . amLODipine (NORVASC) 10 MG tablet Take 1 tablet (10 mg total) by mouth daily. 30 tablet 2  . Artificial Tear Ointment (DRY EYES OP) Place 1 drop into both eyes daily as needed (dry eyes).     Marland Kitchen aspirin 81 MG chewable tablet Chew 1 tablet (81 mg total) by mouth daily. 90 tablet 0  . blood glucose meter kit and supplies Dispense based on patient and insurance preference. Use up to four times daily as directed. (FOR ICD-10 E10.9, E11.9). 1 each 0  . labetalol (NORMODYNE) 100 MG tablet Take 1 tablet (100 mg total) by mouth 3 (three) times daily. 90 tablet 2  . losartan (COZAAR) 50 MG tablet Take 1 tablet (50 mg total) by mouth 2 (two) times daily. 60 tablet 2  . metFORMIN (GLUCOPHAGE) 500 MG tablet Take 1 tablet (500 mg total) by mouth 2 (two) times daily with a meal. 60 tablet 2   No current facility-administered medications on file prior to visit.    Allergies:   Allergies  Allergen Reactions  . Codeine Other (See Comments)    Made her loopy     Physical Exam  Vitals:   04/20/19 1350  BP: 117/79  Pulse: 74  Temp: (!) 96.8 F (36 C)  TempSrc: Oral  Weight: 181 lb 9.6 oz (82.4 kg)  Height: 5' 8" (1.727 m)   Body mass index is 27.61 kg/m. No exam data present  Depression screen [facility] 2/9 04/20/2019  Decreased Interest 0  Down, Depressed, Hopeless 0  PHQ - 2 Score 0     General: well developed, well nourished,  pleasant middle-age Caucasian female, seated, in no evident distress Head: head normocephalic and atraumatic.   Neck: supple with no carotid or supraclavicular bruits Cardiovascular: regular rate and rhythm, no murmurs Musculoskeletal: no deformity Skin:  no rash/petichiae Vascular:  Normal pulses all extremities   Neurologic Exam Mental Status: Awake and fully alert.   Normal speech and language during visit.  Oriented to place and time. Recent and remote memory intact. Attention span,  concentration and fund of knowledge appropriate. Mood and affect appropriate.  Cranial Nerves: Fundoscopic exam reveals sharp disc margins. Pupils equal, briskly reactive to light. Extraocular movements full with evidence of vertical nystagmus.  Also noted left eye convergence insufficiency.  Visual fields right inferior homonymous quadrantanopia. Hearing intact. Facial sensation intact. Face, tongue, palate moves normally and symmetrically.  Motor: Normal bulk and tone.  Full strength RUE and RLE but did note mild decrease right hand dexterity.  Full strength left upper and lower extremity Sensory.: intact to touch , pinprick , position and vibratory sensation.  Coordination: Rapid alternating movements normal in all extremities except mildly decreased  right hand. Finger-to-nose and heel-to-shin performed accurately bilaterally.  Mild action tremor noted left hand Gait and Station: Arises from chair without difficulty. Stance is normal. Gait demonstrates normal stride length and balance Reflexes: 1+ and symmetric. Toes downgoing.     NIHSS  2 Modified Rankin  3    Diagnostic Data (Labs, Imaging, Testing)  Ct Head Wo Contrast 03/11/2019 IMPRESSION:  Grossly stable intraparenchymal hemorrhage is noted in the left basal ganglia compared to prior exam. New low density is noted involving the left posterior parietal cortex with sulcal effacement concerning for acute infarction.   Mr Brain Wo Contrast 03/11/2019 IMPRESSION:  1. Multiple acute to early subacute cerebral infarcts bilaterally, largest in the posterior left MCA region.  2. Subacute hemorrhage in the left lentiform nucleus, unchanged in size from 03/05/2019. Mild edema without significant mass effect.  3. Moderate chronic small vessel ischemic disease with multiple chronic lacunar infarcts.  4. Chronic microhemorrhages likely related to hypertension.   Mr Jeri Cos Contrast 03/12/2019 IMPRESSION:  1. No pathologic enhancement  within the brain.  2. Unchanged subacute hemorrhage positioned at the posterior left lentiform nucleus without significant regional mass effect.  3. Multiple additional acute to subacute cerebral infarcts, better assessed on recent noncontrast MRI.   Dg Chest Portable 1 View 03/11/2019 IMPRESSION:  Lingular/bilateral lower lobe atelectasis.  US Abdomen Limited Ruq 03/11/2019 IMPRESSION:  Hepatic steatosis, otherwise unremarkable right upper quadrant ultrasound.   CTA H&N  03/06/19  IMPRESSION: 1. No aneurysm, dissection or hemodynamically significant stenosis of the major cervical or intracranial arteries. 2. Unchanged size of hypertensive hemorrhage in left basal ganglia.  Transthoracic Echocardiogram  03/06/2019 IMPRESSIONS  1. Left ventricular ejection fraction, by visual estimation, is 55 to 60%. The left ventricle has normal function. There is mildly increased left ventricular hypertrophy.  2. Elevated left ventricular end-diastolic pressure.  3. Left ventricular diastolic parameters are consistent with Grade I diastolic dysfunction (impaired relaxation).  4. Global right ventricle has normal systolic function.The right ventricular size is normal. No increase in right ventricular wall thickness.  5. Left atrial size was normal.  6. Right atrial size was normal.  7. The mitral valve is grossly normal. Trace mitral valve regurgitation.  8. The tricuspid valve is grossly normal. Tricuspid valve regurgitation is trivial.  9. The aortic valve is tricuspid. Aortic valve regurgitation is not visualized. No evidence of aortic valve sclerosis or stenosis. 10. The pulmonic valve was grossly normal. Pulmonic valve regurgitation is not visualized. 11. The inferior vena cava is normal in size with greater than 50% respiratory variability, suggesting right atrial pressure of 3 mmHg.  TEE -no vegetations or cardiac source of embolism.  ECG - SR rate 67 BPM. (See cardiology reading  for complete details)    ASSESSMENT: VORA CLOVER is a 63 y.o. year old female with recent admission on 03/05/2019 for left basal ganglia hemorrhage felt at that time to be hypertensive in etiology and then returned on 03/11/2019 with generalized weakness, nausea and slight confusion and found to have multiple additional acute and subacute bilateral cerebral infarcts embolic pattern secondary to unknown source.  Septic emboli and vasculitis ruled out.  Not a candidate at that time to pursue further embolic work-up.  Vascular risk factors include HTN, HLD, DM, history of EtOH use, new diagnosis DM.  Residual deficits of visual impairment, RUE decreased dexterity and cognitive impairment    PLAN:  1. Multiple strokes: Continue aspirin 81 mg daily  for secondary stroke prevention.  Not currently  on statin due to prior elevated LFTs.  Maintain strict control of hypertension with blood pressure goal below 130/90, diabetes with hemoglobin A1c goal below 6.5% and cholesterol with LDL cholesterol (bad cholesterol) goal below 70 mg/dL.  I also advised the patient to eat a healthy diet with plenty of whole grains, cereals, fruits and vegetables, exercise regularly with at least 30 minutes of continuous activity daily and maintain ideal body weight. 2. HTN: Advised to continue current treatment regimen.  Today's BP stable.  Advised to continue to monitor at home along with continued follow-up with PCP for management 3. HLD: Statin discontinued due to elevated LFTs.  We will repeat LFTs and if normalized, will consider restarting statin for secondary stroke prevention 4. DMII: Advised to continue to monitor glucose levels at home along with continued follow-up with PCP for management and monitoring 5. Residual deficits: Ongoing participation in outpatient therapies.  Will refer to neuro-ophthalmology for further evaluation of visual impairment.  Due to ongoing visual impairment, will assist patient with  short-term disability paperwork at this time. 6. Left arm jerking: Due to progressive nature of symptoms, concern for possible focal seizure.  Will obtain EEG and consider AED if indicated in the future 7. Cryptogenic etiology: Advised patient and husband that further discussion with Dr. Leonie Man is needed to pursue further embolic work-up such as with loop recorder to assess for possible atrial fibrillation 8. History of EtOH use: Drinks only on occasion at this time.  Discussion regarding no more than 1 alcoholic beverage per day which she verbalized understanding    Follow up in 3 months or call earlier if needed   Greater than 50% of time during this prolonged 60 minute visit was spent on counseling, explanation of diagnosis of multiple strokes, reviewing risk factor management of HTN, HLD, DM and EtOH use, discussion regarding residual deficits, planning of further management along with potential future management, and discussion with patient and family answering all questions.    Frann Rider, AGNP-BC  [facility] [address] [place], [place] [address]  Phone [phone] Fax [phone] Note: This document was prepared with digital dictation and possible smart phrase technology. Any transcriptional errors that result from this process are unintentional.

## 2019-04-20 NOTE — Therapy (Signed)
[facility] [address] [place], [place], [address] Phone: [phone]   Fax:  [phone]  Occupational Therapy Treatment  Patient Details  Name: YANNELI WHETZEL MRN: [id number] Date of Birth: Apr 03, 1956 Referring Provider (OT): Dr. Erlinda Hong   Encounter Date: 04/20/2019  OT End of Session - 04/20/19 1022    Visit Number  3    Number of Visits  25    Date for OT Re-Evaluation  06/21/19    Authorization Type  [employer]    Authorization Time Period  60 days    Authorization - Visit Number  3    Authorization - Number of Visits  30    OT Start Time  1021    OT Stop Time  1100    OT Time Calculation (min)  39 min    Activity Tolerance  Patient tolerated treatment well       Past Medical History:  Diagnosis Date  . Hypertension    controlled    Past Surgical History:  Procedure Laterality Date  . ABDOMINAL HYSTERECTOMY    . BACK SURGERY    . BUBBLE STUDY  03/14/2019   Procedure: BUBBLE STUDY;  Surgeon: [name], MD;  Location: [facility];  Service: Cardiovascular;;  . TEE WITHOUT CARDIOVERSION N/A 03/14/2019   Procedure: TRANSESOPHAGEAL ECHOCARDIOGRAM (TEE);  Surgeon: [name] [name], MD;  Location: [facility] ENDOSCOPY;  Service: Cardiovascular;  Laterality: N/A;    There were no vitals filed for this visit.  Subjective Assessment - 04/20/19 1027    Subjective   Pt denies pain    Currently in Pain?  No/denies    Pain Onset  1 to 4 weeks ago              Treatment: Number cancellation task, 1.5 M with pt wearing her reading glasses, pt completed approx 1/2 page with 1 error. Pt was instructed in beginning coordination HEP and ball ex in supine and seated. Pt returned demonstraion with min-mod v.c Pt demonstrates bilateral deficits. Pt may benefit from review.             OT Education - 04/20/19 1626    Education Details  coordination HEP, ball ex, recommendation that pt perfroming reading or  vision tasks for approx 10-15 mins prior to rest.    Person(s) Educated  Patient;Spouse    Methods  Explanation;Demonstration;Verbal cues;Handout    Comprehension  Verbalized understanding;Returned demonstration;Verbal cues required       OT Short Term Goals - 03/23/19 1730      OT SHORT TERM GOAL #1   Title  Pt/ caregiver will be I with inital HEP.due 05/07/2019    Time  6    Period  Weeks    Status  New    Target Date  05/07/19      OT SHORT TERM GOAL #2   Title  Pt./caregiver will verbalize understanding of visual compensation strategies    Time  6    Period  Weeks      OT SHORT TERM GOAL #3   Title  Pt will perform tabletop scanning activities with 75% accuracy and min v.c    Time  6    Period  Weeks    Status  New      OT SHORT TERM GOAL #4   Title  Pt will demonstrate improved fine motor coordination for ADLS as evidenced by decreasing 9 hole peg test scaore to 50 secs or less.    Time  6    Period  Weeks    Status  New      OT SHORT TERM GOAL #5   Title  Pt will navigate a  minimally distracting environment and locate items with 75% or better accuracy.    Time  6    Period  Weeks    Status  New      Additional Short Term Goals   Additional Short Term Goals  Yes      OT SHORT TERM GOAL #6   Title  Pt will demonstrate ability to read a large print sentence with no more than min v.c    Time  6    Period  Weeks    Status  New      OT SHORT TERM GOAL #7   Title  Pt will perform simple familiar home managment tasks with no more than min A.    Time  6    Period  Weeks    Status  New        OT Long Term Goals - 03/23/19 1735      OT LONG TERM GOAL #1   Title  Pt will be modified indpendent with all basic ADLs.-06/21/2019    Time  12    Period  Weeks    Status  New    Target Date  06/21/19      OT LONG TERM GOAL #2   Title  Pt will perform mod complex home management  with supervision, min v.c    Time  12    Period  Weeks    Status  New      OT LONG  TERM GOAL #3   Title  Pt will perfrom basic cooking with min A    Time  12    Period  Weeks    Status  New      OT LONG TERM GOAL #4   Title  Pt will demonstrate ability to read a short large print paragraph with no more than min v.c    Time  12    Period  Weeks    Status  New      OT LONG TERM GOAL #5   Title  Pt will perform tabletop scanning activities with 90% or better accuracy.    Time  12    Period  Weeks    Status  New      OT LONG TERM GOAL #6   Title  Pt will perform environmental scanning with 90% or better accuracy in a mod distracting environment.    Time  12    Period  Weeks    Status  New            Plan - 04/20/19 1620    Clinical Impression Statement  Pt is progressing towards goals. Pt demonstrates improving visual scanning, however she is only able to work on visually dependent tasks x approx 10-15 mins prior to rest due to fatigue.    OT Occupational Profile and History  Detailed Assessment- Review of Records and additional review of physical, cognitive, psychosocial history related to current functional performance    Occupational performance deficits (Please refer to evaluation for details):  ADL's;IADL's;Work;Leisure;Social Participation    Body Structure / Function / Physical Skills  ADL;UE functional use;Flexibility;Vision;FMC;Cardiopulmonary status limiting activity;Balance;Continence;Gait;ROM;GMC;Coordination;Decreased knowledge of precautions;IADL;Decreased knowledge of use of DME;Dexterity;Mobility;Strength    Rehab Potential  Good    Clinical Decision Making  Several treatment options, min-mod task modification necessary  Comorbidities Affecting Occupational Performance:  May have comorbidities impacting occupational performance    Modification or Assistance to Complete Evaluation   Min-Moderate modification of tasks or assist with assess necessary to complete eval   due to visual and cognitive deficits   OT Frequency  2x / week    OT Duration   12 weeks    OT Treatment/Interventions  Self-care/ADL training;Visual/perceptual remediation/compensation;Patient/family education;Aquatic Therapy;DME and/or AE instruction;Paraffin;Passive range of motion;Balance training;Cryotherapy;Fluidtherapy;Functional Mobility Training;Moist Heat;Therapeutic exercise;Manual Therapy;Therapeutic activities;Neuromuscular education;Cognitive remediation/compensation    Plan  continue to address visual skills, environmental scanning, coordination/ simple functional tasks(Pt is only able to tolerate visual activities for approx 15 mins at a time then she needs a rest and switch to other activities.)    Consulted and Agree with Plan of Care  Patient;Family member/caregiver    Family Member Consulted  husband       Patient will benefit from skilled therapeutic intervention in order to improve the following deficits and impairments:   Body Structure / Function / Physical Skills: ADL, UE functional use, Flexibility, Vision, FMC, Cardiopulmonary status limiting activity, Balance, Continence, Gait, ROM, GMC, Coordination, Decreased knowledge of precautions, IADL, Decreased knowledge of use of DME, Dexterity, Mobility, Strength       Visit Diagnosis: Visuospatial deficit  Frontal lobe and executive function deficit  Attention and concentration deficit  Other lack of coordination  Muscle weakness (generalized)    Problem List Patient Active Problem List   Diagnosis Date Noted  . CVA (cerebral vascular accident) ([place]) 03/11/2019  . Hypertensive emergency 03/09/2019  . Hyperlipidemia 03/09/2019  . DM (diabetes mellitus) ([facility]) 03/07/2019  . HTN (hypertension) 03/07/2019  . Alcohol use 03/07/2019  . 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 [place], [place], [address] Phone: [phone]   Fax:  [phone]  Name: EBRU TRUMBO MRN: [id number] Date of Birth: Jan 04, 1956

## 2019-04-20 NOTE — Patient Instructions (Signed)
  Coordination Activities  Perform the following activities for 20 minutes 1 times per day with both hand(s).   Deal cards with your thumb (Hold deck in hand and push card off top with thumb).  Rotate card in hand (clockwise and counter-clockwise).  Pick up coins and place in container or coin bank.  Pick up coins and stack.  Pick up coins one at a time until you get 5-10 in your hand, then move coins from palm to fingertips to place in container at a time.    Laying on your back hold a ball between your hands , raise arms up to just above shoulder height without elevating shoulder or arching back,then  lower ball back to your waist Keep elbows straight.   Perform 2 sets of 10 reps 1x day hold a ball in between both hands, straighten elbows, then bend elbows and bring ball to your chest     Perform 2 sets of 10 reps 1x day Seated hold a ball between your hands , raise arms up to shoulder height without elevating shoulder or arching back,then  lower ball back to your waist Keep elbows straight.   Perform 2 sets of 10 reps 1x day

## 2019-04-20 NOTE — Therapy (Signed)
[facility] [address] [place], [place], [address] Phone: [phone]   Fax:  [phone]  Speech Language Pathology Treatment  Patient Details  Name: Belinda Banks MRN: [id number] Date of Birth: 01-Aug-1955 Referring Provider (SLP): Dr. Rosalin Hawking   Encounter Date: 04/20/2019  End of Session - 04/20/19 1142    Visit Number  9    Number of Visits  17    Date for SLP Re-Evaluation  05/18/19    SLP Start Time  0933    SLP Stop Time   1010    SLP Time Calculation (min)  37 min    Activity Tolerance  Patient tolerated treatment well       Past Medical History:  Diagnosis Date  . Hypertension    controlled    Past Surgical History:  Procedure Laterality Date  . ABDOMINAL HYSTERECTOMY    . BACK SURGERY    . BUBBLE STUDY  03/14/2019   Procedure: BUBBLE STUDY;  Surgeon: [name], MD;  Location: [facility];  Service: Cardiovascular;;  . TEE WITHOUT CARDIOVERSION N/A 03/14/2019   Procedure: TRANSESOPHAGEAL ECHOCARDIOGRAM (TEE);  Surgeon: [name] [name], MD;  Location: [facility] ENDOSCOPY;  Service: Cardiovascular;  Laterality: N/A;    There were no vitals filed for this visit.  Subjective Assessment - 04/20/19 0945    Subjective  Pt relates that she had to ask husband if she was working on similarities or differences when doing homework.    Patient is accompained by:  Family member   husband [name]   Currently in Pain?  No/denies            ADULT SLP TREATMENT - 04/20/19 0953      General Information   Behavior/Cognition  Alert;Cooperative;Pleasant mood      Treatment Provided   Treatment provided  Cognitive-Linquistic      Cognitive-Linquistic Treatment   Treatment focused on  Patient/family/caregiver education;Aphasia    Skilled Treatment  Pt and husband state pt's anomia occurs approx 1-2 times/day. SLP told pt that at that frequency formal ST for aphasia will not be practical. SLP modified  pt's goals. Pt reports being limited in what she can look at right now in that it affects her IADLs for anything >10 minutes in that she's not able to participate in anything over that time frame due to her vision. SLP explained that prudent at this time to reduce frequency to once/week until vision is such that pt is doing more at home and that may require ST back up to x2/week if pt finds her cognition is affected.       Assessment / Recommendations / Plan   Plan  Continue with current plan of care      Progression Toward Goals   Progression toward goals  --   Goals updated to reflect pt rare anomia        SLP Short Term Goals - 04/18/19 1125      SLP SHORT TERM GOAL #1   Title  Pt will utilize calendar and med Environmental education officer to recall if she has taken her meds or not, twice a day with min A from spouse over 2 sessions    Status  Achieved      SLP SHORT TERM GOAL #2   Title  Pt will verbally sequence 4-5 steps in chores and meals that she did prior to CVA with rare min A over 2 sessions    Status  Achieved  SLP SHORT TERM GOAL #3   Title  Pt will complete mildly complex naming tasks with occasional min A and 90% accuracy    Status  Achieved      SLP SHORT TERM GOAL #4   Title  Pt and spouse will carryover 2 strategies for processing conversation and instructions outside of therapy per their report over 2 sessions    Time  --    Period  --    Status  Achieved       SLP Long Term Goals - 04/20/19 0950      SLP LONG TERM GOAL #1   Title  Pt will manage medications 2/3x a day she takes meds with external aids and rare min A from spouse over 3 sessions    Baseline  04-18-19, 04-20-19    Time  5    Period  Weeks    Status  On-going      SLP LONG TERM GOAL #2   Title  Pt will carryover 3 compensations for attention to complete IADL's with occasional min A from spouse over 2 sessions    Baseline  04-18-19, 04-20-19    Time  5    Period  Weeks    Status  On-going      SLP  LONG TERM GOAL #3   Title  Pt will utilize compensations for aphasia in mildly complex 15 minute conversation with rare min A over 3 sessions    Status  Deferred   due to approx once/day of anomia      Plan - 04/20/19 1143    Clinical Impression Statement  Ashunte reports carrying over strategies for visual processing and utilizing sensory breaks and timers to reduce headaches and cognitive fatigue. SLP learned today pt's anomic events occur approx x1-2/day. Continue skiled ST at x1/week until pt vision improves at which time she can tolerate longer and longer tasks, possibly finding more deficits with her attention. She would cont to benefit from ST to maximxize cognition and communication for improved QOL and safety.    Speech Therapy Frequency  2x / week    Duration  --   8 weeks or 17 sessions   Treatment/Interventions  Environmental controls;Language facilitation;Compensatory techniques;Cognitive reorganization;Functional tasks;SLP instruction and feedback;Compensatory strategies;Patient/family education;Multimodal communcation approach;Internal/external aids    Potential to Achieve Goals  Good       Patient will benefit from skilled therapeutic intervention in order to improve the following deficits and impairments:   Cognitive communication deficit    Problem List Patient Active Problem List   Diagnosis Date Noted  . CVA (cerebral vascular accident) ([facility]) 03/11/2019  . Hypertensive emergency 03/09/2019  . Hyperlipidemia 03/09/2019  . DM (diabetes mellitus) ([facility]) 03/07/2019  . HTN (hypertension) 03/07/2019  . Alcohol use 03/07/2019  . 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 [place], [place], [address] Phone: [phone]   Fax:  [phone]   Name: Belinda Banks MRN: [id number] Date of  Birth: 1955/10/31

## 2019-04-20 NOTE — Patient Instructions (Signed)
Continue aspirin 81 mg daily for secondary stroke prevention  We will recheck LFTs at today's visit and if improved, consider restarting atorvastatin for cholesterol management  Establish care with your primary care provider for ongoing monitoring and management of blood pressure, cholesterol and diabetes  Will discuss with Dr. Leonie Man regarding further studies to look for possible atrial fibrillation causing recent stroke  Continue to work with outpatient therapies for ongoing improvement  Referral will be placed to neuro ophthalmology for further evaluation of your visual impairment  Recommend EEG to rule out seizure activity  Continue to monitor blood pressure at home  Maintain strict control of hypertension with blood pressure goal below 130/90, diabetes with hemoglobin A1c goal below 6.5% and cholesterol with LDL cholesterol (bad cholesterol) goal below 70 mg/dL. I also advised the patient to eat a healthy diet with plenty of whole grains, cereals, fruits and vegetables, exercise regularly and maintain ideal body weight.  Followup in the future with me in 3 months or call earlier if needed       Thank you for coming to see [place] at [facility]. I hope we have been able to provide you high quality care today.  You may receive a patient satisfaction survey over the next few weeks. We would appreciate your feedback and comments so that we may continue to improve ourselves and the health of our patients.

## 2019-04-21 LAB — COMPREHENSIVE METABOLIC PANEL
ALT: 32 IU/L (ref 0–32)
AST: 26 IU/L (ref 0–40)
Albumin/Globulin Ratio: 1.6 (ref 1.2–2.2)
Albumin: 4.6 g/dL (ref 3.8–4.8)
Alkaline Phosphatase: 91 IU/L (ref 39–117)
BUN/Creatinine Ratio: 13 (ref 12–28)
BUN: 13 mg/dL (ref 8–27)
Bilirubin Total: 1 mg/dL (ref 0.0–1.2)
CO2: 23 mmol/L (ref 20–29)
Calcium: 10.6 mg/dL — ABNORMAL HIGH (ref 8.7–10.3)
Chloride: 100 mmol/L (ref 96–106)
Creatinine, Ser: 0.97 mg/dL (ref 0.57–1.00)
GFR calc Af Amer: 72 mL/min/{1.73_m2} (ref >59)
GFR calc non Af Amer: 63 mL/min/{1.73_m2} (ref >59)
Globulin, Total: 2.8 g/dL (ref 1.5–4.5)
Glucose: 128 mg/dL — ABNORMAL HIGH (ref 65–99)
Potassium: 4.8 mmol/L (ref 3.5–5.2)
Sodium: 139 mmol/L (ref 134–144)
Total Protein: 7.4 g/dL (ref 6.0–8.5)

## 2019-04-25 ENCOUNTER — Other Ambulatory Visit: Payer: Self-pay | Admitting: Adult Health

## 2019-04-25 ENCOUNTER — Ambulatory Visit: Payer: Managed Care, Other (non HMO) | Admitting: Occupational Therapy

## 2019-04-25 ENCOUNTER — Encounter: Payer: Self-pay | Admitting: Speech Pathology

## 2019-04-25 ENCOUNTER — Telehealth: Payer: Self-pay | Admitting: *Deleted

## 2019-04-25 ENCOUNTER — Ambulatory Visit: Payer: Managed Care, Other (non HMO) | Admitting: Speech Pathology

## 2019-04-25 ENCOUNTER — Other Ambulatory Visit: Payer: Self-pay

## 2019-04-25 DIAGNOSIS — R4701 Aphasia: Secondary | ICD-10-CM | POA: Insufficient documentation

## 2019-04-25 DIAGNOSIS — R41840 Attention and concentration deficit: Secondary | ICD-10-CM

## 2019-04-25 DIAGNOSIS — R41841 Cognitive communication deficit: Secondary | ICD-10-CM | POA: Insufficient documentation

## 2019-04-25 DIAGNOSIS — R41842 Visuospatial deficit: Secondary | ICD-10-CM | POA: Insufficient documentation

## 2019-04-25 DIAGNOSIS — R41844 Frontal lobe and executive function deficit: Secondary | ICD-10-CM | POA: Insufficient documentation

## 2019-04-25 DIAGNOSIS — R278 Other lack of coordination: Secondary | ICD-10-CM

## 2019-04-25 DIAGNOSIS — E1159 Type 2 diabetes mellitus with other circulatory complications: Secondary | ICD-10-CM | POA: Diagnosis present

## 2019-04-25 DIAGNOSIS — E785 Hyperlipidemia, unspecified: Secondary | ICD-10-CM

## 2019-04-25 DIAGNOSIS — E119 Type 2 diabetes mellitus without complications: Secondary | ICD-10-CM | POA: Diagnosis present

## 2019-04-25 DIAGNOSIS — M6281 Muscle weakness (generalized): Secondary | ICD-10-CM | POA: Insufficient documentation

## 2019-04-25 MED ORDER — ATORVASTATIN CALCIUM 20 MG PO TABS: 20.0000 mg | ORAL_TABLET | Freq: Every day | ORAL | 3 refills | Status: DC

## 2019-04-25 NOTE — Telephone Encounter (Signed)
Pt returning call please call back °

## 2019-04-25 NOTE — Telephone Encounter (Signed)
I called and spoke to pt and relayed the results of the lab results per JM/NP.  LFT normalized.  Ok to start atorvastatin  20 mg po daily ( with or with out food  anytime of day). She had question that she had second stroke due to atorvastatin.  I relayed that cholesterol medications are given to prevent stroke.  They will restart taking this medication as ordered.  I gave her Dr. Sanda Klein neuro opthamology [facility] # for her to call.  They verbalized understanding.

## 2019-04-25 NOTE — Telephone Encounter (Signed)
LMVM for pt to return call for her lab results.

## 2019-04-25 NOTE — Telephone Encounter (Signed)
-----   Message from Frann Rider, NP sent at 04/25/2019  9:49 AM EST ----- Recent blood work showed a normalized liver function levels and recommend restarting atorvastatin 20 mg daily.  Recommend repeating levels in 4 weeks and either our office or with PCP

## 2019-04-25 NOTE — Therapy (Signed)
[facility] [address] [place], [place], [address] Phone: [phone]   Fax:  [phone]  Occupational Therapy Treatment  Patient Details  Name: Belinda Banks MRN: [id number] Date of Birth: 1955-07-09 Referring Provider (OT): Dr. Erlinda Hong   Encounter Date: 04/25/2019  OT End of Session - 04/25/19 1111    Visit Number  4    Number of Visits  25    Date for OT Re-Evaluation  06/21/19    Authorization Type  [employer]    Authorization Time Period  60 days    Authorization - Visit Number  4    Authorization - Number of Visits  30    OT Start Time  1020    OT Stop Time  1100    OT Time Calculation (min)  40 min    Activity Tolerance  Patient tolerated treatment well    Behavior During Therapy  Restless       Past Medical History:  Diagnosis Date  . Hypertension    controlled    Past Surgical History:  Procedure Laterality Date  . ABDOMINAL HYSTERECTOMY    . BACK SURGERY    . BUBBLE STUDY  03/14/2019   Procedure: BUBBLE STUDY;  Surgeon: [name], MD;  Location: [facility];  Service: Cardiovascular;;  . TEE WITHOUT CARDIOVERSION N/A 03/14/2019   Procedure: TRANSESOPHAGEAL ECHOCARDIOGRAM (TEE);  Surgeon: [name] [name], MD;  Location: [facility] ENDOSCOPY;  Service: Cardiovascular;  Laterality: N/A;    There were no vitals filed for this visit.  Subjective Assessment - 04/25/19 1023    Subjective   My vision changes periodically t/o the day. Sometimes it feels like looking at a Picasso painting where everything is distorted or I can't see all of the image    Patient is accompanied by:  Family member   husband   Pertinent History  ICH 03/05/19    Limitations  fall risk, no driving    Patient Stated Goals  improve vision    Currently in Pain?  No/denies                   OT Treatments/Exercises (OP) - 04/25/19 0001      ADLs   ADL Comments  Verbally reviewed coordination HEP and ball ex in supine.  Pt reports she did not work on ball ex. Also discussed ideas for intro to reading including use of line guide and easy to read childrens books w/ 1-2 sentences on each page and larger print.  Discussed functional visual/reading tasks like settings on washing machine and microwave. Also discussed/reviewed visual scanning strategies for tabletop scanning     Visual/Perceptual Exercises   Letter Search  letter cancellation (251M) print size: 1st page 100% accurate (5 rows),  2nd page: 3 errors (1 error on 3 rows)    Other Exercises  Copying phone numbers (251M print size) from Lt to Rt side of page w/ min errors mostly self corrected and cues to correct last error. Pt did better w/ line guide and saying numbers out loud before writing them               OT Short Term Goals - 03/23/19 1730      OT SHORT TERM GOAL #1   Title  Pt/ caregiver will be I with inital HEP.due 05/07/2019    Time  6    Period  Weeks    Status  New    Target Date  05/07/19  OT SHORT TERM GOAL #2   Title  Pt./caregiver will verbalize understanding of visual compensation strategies    Time  6    Period  Weeks      OT SHORT TERM GOAL #3   Title  Pt will perform tabletop scanning activities with 75% accuracy and min v.c    Time  6    Period  Weeks    Status  New      OT SHORT TERM GOAL #4   Title  Pt will demonstrate improved fine motor coordination for ADLS as evidenced by decreasing 9 hole peg test scaore to 50 secs or less.    Time  6    Period  Weeks    Status  New      OT SHORT TERM GOAL #5   Title  Pt will navigate a  minimally distracting environment and locate items with 75% or better accuracy.    Time  6    Period  Weeks    Status  New      Additional Short Term Goals   Additional Short Term Goals  Yes      OT SHORT TERM GOAL #6   Title  Pt will demonstrate ability to read a large print sentence with no more than min v.c    Time  6    Period  Weeks    Status  New      OT SHORT TERM GOAL  #7   Title  Pt will perform simple familiar home managment tasks with no more than min A.    Time  6    Period  Weeks    Status  New        OT Long Term Goals - 03/23/19 1735      OT LONG TERM GOAL #1   Title  Pt will be modified indpendent with all basic ADLs.-06/21/2019    Time  12    Period  Weeks    Status  New    Target Date  06/21/19      OT LONG TERM GOAL #2   Title  Pt will perform mod complex home management  with supervision, min v.c    Time  12    Period  Weeks    Status  New      OT LONG TERM GOAL #3   Title  Pt will perfrom basic cooking with min A    Time  12    Period  Weeks    Status  New      OT LONG TERM GOAL #4   Title  Pt will demonstrate ability to read a short large print paragraph with no more than min v.c    Time  12    Period  Weeks    Status  New      OT LONG TERM GOAL #5   Title  Pt will perform tabletop scanning activities with 90% or better accuracy.    Time  12    Period  Weeks    Status  New      OT LONG TERM GOAL #6   Title  Pt will perform environmental scanning with 90% or better accuracy in a mod distracting environment.    Time  12    Period  Weeks    Status  New            Plan - 04/25/19 1111    Clinical Impression Statement  Pt slowly progressing w/ vision  using line guide and larger print for reading preparation activities. Pt encouraged to slowly scan tabletop for items and for functional tasks (washing machine settings, remote buttons, etc)    Occupational performance deficits (Please refer to evaluation for details):  ADL's;IADL's;Work;Leisure;Social Participation    Body Structure / Function / Physical Skills  ADL;UE functional use;Flexibility;Vision;FMC;Cardiopulmonary status limiting activity;Balance;Continence;Gait;ROM;GMC;Coordination;Decreased knowledge of precautions;IADL;Decreased knowledge of use of DME;Dexterity;Mobility;Strength    Rehab Potential  Good    OT Frequency  2x / week    OT Duration  12 weeks     OT Treatment/Interventions  Self-care/ADL training;Visual/perceptual remediation/compensation;Patient/family education;Aquatic Therapy;DME and/or AE instruction;Paraffin;Passive range of motion;Balance training;Cryotherapy;Fluidtherapy;Functional Mobility Training;Moist Heat;Therapeutic exercise;Manual Therapy;Therapeutic activities;Neuromuscular education;Cognitive remediation/compensation    Plan  continue tabletop scanning/visual skills, coordination, functional tasks    Consulted and Agree with Plan of Care  Patient;Family member/caregiver    Family Member Consulted  husband       Patient will benefit from skilled therapeutic intervention in order to improve the following deficits and impairments:   Body Structure / Function / Physical Skills: ADL, UE functional use, Flexibility, Vision, FMC, Cardiopulmonary status limiting activity, Balance, Continence, Gait, ROM, GMC, Coordination, Decreased knowledge of precautions, IADL, Decreased knowledge of use of DME, Dexterity, Mobility, Strength       Visit Diagnosis: Visuospatial deficit  Attention and concentration deficit  Other lack of coordination    Problem List Patient Active Problem List   Diagnosis Date Noted  . CVA (cerebral vascular accident) ([facility]) 03/11/2019  . Hypertensive emergency 03/09/2019  . Hyperlipidemia 03/09/2019  . DM (diabetes mellitus) ([facility]) 03/07/2019  . HTN (hypertension) 03/07/2019  . Alcohol use 03/07/2019  . 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 [place], [place], [address] Phone: [phone]   Fax:  [phone]  Name: Belinda Banks MRN: [id number] Date of Birth: April 03, 1956

## 2019-04-25 NOTE — Therapy (Signed)
[facility] [address] [place], [place], [address] Phone: [phone]   Fax:  [phone]  Speech Language Pathology Treatment  Patient Details  Name: Belinda Banks MRN: [id number] Date of Birth: 04-03-1956 Referring Provider (SLP): Dr. Rosalin Hawking   Encounter Date: 04/25/2019  End of Session - 04/25/19 1251    Visit Number  10    Number of Visits  17    Date for SLP Re-Evaluation  05/18/19    SLP Start Time  1103    SLP Stop Time   1146    SLP Time Calculation (min)  43 min    Activity Tolerance  Patient tolerated treatment well       Past Medical History:  Diagnosis Date  . Hypertension    controlled    Past Surgical History:  Procedure Laterality Date  . ABDOMINAL HYSTERECTOMY    . BACK SURGERY    . BUBBLE STUDY  03/14/2019   Procedure: BUBBLE STUDY;  Surgeon: [name], MD;  Location: [facility];  Service: Cardiovascular;;  . TEE WITHOUT CARDIOVERSION N/A 03/14/2019   Procedure: TRANSESOPHAGEAL ECHOCARDIOGRAM (TEE);  Surgeon: [name] [name], MD;  Location: [facility] ENDOSCOPY;  Service: Cardiovascular;  Laterality: N/A;    There were no vitals filed for this visit.  Subjective Assessment - 04/25/19 1114    Subjective  "I'm trying harder to think of the word and I usually can think of it"    Currently in Pain?  No/denies            ADULT SLP TREATMENT - 04/25/19 1246      General Information   Behavior/Cognition  Alert;Cooperative;Pleasant mood      Treatment Provided   Treatment provided  Cognitive-Linquistic      Cognitive-Linquistic Treatment   Treatment focused on  Patient/family/caregiver education;Aphasia    Skilled Treatment  Pt continues to report word finidng difficulty daily, however less frequent. Her vision has been bothering her this past week and she has spent extra time with her eyes closed. Complex sequential and relational naming tasks with rare min A to mod I. Pt  completed analogies with mod I. She required usual min A to verbalize compensations for anomia.       Assessment / Recommendations / Plan   Plan  Continue with current plan of care      Progression Toward Goals   Progression toward goals  Progressing toward goals       SLP Education - 04/25/19 1249    Education Details  compensations for aphasia    Person(s) Educated  Patient;Spouse    Methods  Explanation;Demonstration;Verbal cues    Comprehension  Verbalized understanding;Returned demonstration;Verbal cues required      Speech Therapy Progress Note  Dates of Reporting Period: 03/23/19 to 04/25/19  Objective Reports of Subjective Statement: See goals  Objective Measurements: 1-2 anomic episodes a day  Goal Update: continue goals  Plan: Continue POC, frequency reduced to 1x a week last session  Reason Skilled Services are Required: Pt and spouse continue to report frustration with word finding difficulties. As aphasia is improving and pt's visual impairments are affecting her ability to participate in IADL's at home, we reduced ST to 1x a week with pt education that as her vision improves and she begins to attempt IADL's, we may increase frequency to 2x to maximize compensations for cognition as needed.      SLP Short Term Goals - 04/25/19 1250  SLP SHORT TERM GOAL #1   Title  Pt will utilize calendar and med Environmental education officer to recall if she has taken her meds or not, twice a day with min A from spouse over 2 sessions    Status  Achieved      SLP [facility] [place] #2   Title  Pt will verbally sequence 4-5 steps in chores and meals that she did prior to CVA with rare min A over 2 sessions    Status  Achieved      SLP [facility] #3   Title  Pt will complete mildly complex naming tasks with occasional min A and 90% accuracy    Status  Achieved      SLP SHORT TERM GOAL #4   Title  Pt and spouse will carryover 2 strategies for processing conversation and instructions outside  of therapy per their report over 2 sessions    Status  Achieved       SLP Long Term Goals - 04/25/19 1251      SLP LONG TERM GOAL #1   Title  Pt will manage medications 2/3x a day she takes meds with external aids and rare min A from spouse over 3 sessions    Baseline  04-18-19, 04-20-19; 04-25-19    Time  5    Period  Weeks    Status  Achieved      SLP LONG TERM GOAL #2   Title  Pt will carryover 3 compensations for attention to complete IADL's with occasional min A from spouse over 2 sessions    Baseline  04-18-19, 04-20-19    Time  4    Period  Weeks    Status  On-going      SLP LONG TERM GOAL #3   Title  Pt will utilize compensations for aphasia in mildly complex 15 minute conversation with rare min A over 3 sessions    Status  Deferred   due to approx once/day of anomia      Plan - 04/25/19 1249    Clinical Impression Statement  Belinda Banks reports carrying over strategies for visual processing and utilizing sensory breaks and timers to reduce headaches and cognitive fatigue. SLP learned today pt's anomic events occur approx x1-2/day. Continue skiled ST at x1/week until pt vision improves at which time she can tolerate longer and longer tasks, possibly finding more deficits with her attention. She would cont to benefit from ST to maximxize cognition and communication for improved QOL and safety.    Speech Therapy Frequency  1x /week    Duration  --   8 weeks or 17 visits   Treatment/Interventions  Environmental controls;Language facilitation;Compensatory techniques;Cognitive reorganization;Functional tasks;SLP instruction and feedback;Compensatory strategies;Patient/family education;Multimodal communcation approach;Internal/external aids    Potential to Achieve Goals  Good    Potential Considerations  Other (comment)   visual processing impairment      Patient will benefit from skilled therapeutic intervention in order to improve the following deficits and impairments:    Attention and concentration deficit  Cognitive communication deficit    Problem List Patient Active Problem List   Diagnosis Date Noted  . CVA (cerebral vascular accident) ([facility]) 03/11/2019  . Hypertensive emergency 03/09/2019  . Hyperlipidemia 03/09/2019  . DM (diabetes mellitus) ([facility]) 03/07/2019  . HTN (hypertension) 03/07/2019  . Alcohol use 03/07/2019  . ICH (intracerebral hemorrhage) ([facility]) - Hypertensive L Basal Ganglia ICH 03/05/2019    Belinda Banks, Annye Rusk MS, CCC-SLP 04/25/2019, 12:53 PM  [facility] [address] [place], [place], [address] Phone: [phone]   Fax:  [phone]   Name: Belinda Banks MRN: [id number] Date of Birth: 12/09/1955

## 2019-04-26 NOTE — Progress Notes (Signed)
I agree with the above plan 

## 2019-04-27 ENCOUNTER — Ambulatory Visit: Payer: Managed Care, Other (non HMO)

## 2019-04-27 ENCOUNTER — Encounter: Payer: Managed Care, Other (non HMO) | Attending: Neurology | Admitting: Dietician

## 2019-04-27 ENCOUNTER — Encounter: Payer: Self-pay | Admitting: Dietician

## 2019-04-27 ENCOUNTER — Ambulatory Visit: Payer: Managed Care, Other (non HMO) | Admitting: Occupational Therapy

## 2019-04-27 ENCOUNTER — Telehealth: Payer: Self-pay | Admitting: *Deleted

## 2019-04-27 ENCOUNTER — Other Ambulatory Visit: Payer: Self-pay

## 2019-04-27 ENCOUNTER — Ambulatory Visit: Payer: Managed Care, Other (non HMO) | Admitting: Neurology

## 2019-04-27 DIAGNOSIS — M6281 Muscle weakness (generalized): Secondary | ICD-10-CM

## 2019-04-27 DIAGNOSIS — R253 Fasciculation: Secondary | ICD-10-CM | POA: Diagnosis not present

## 2019-04-27 DIAGNOSIS — E1159 Type 2 diabetes mellitus with other circulatory complications: Secondary | ICD-10-CM | POA: Insufficient documentation

## 2019-04-27 DIAGNOSIS — E119 Type 2 diabetes mellitus without complications: Secondary | ICD-10-CM | POA: Diagnosis not present

## 2019-04-27 DIAGNOSIS — R41842 Visuospatial deficit: Secondary | ICD-10-CM

## 2019-04-27 DIAGNOSIS — R41840 Attention and concentration deficit: Secondary | ICD-10-CM

## 2019-04-27 DIAGNOSIS — R41844 Frontal lobe and executive function deficit: Secondary | ICD-10-CM

## 2019-04-27 DIAGNOSIS — R278 Other lack of coordination: Secondary | ICD-10-CM

## 2019-04-27 NOTE — Progress Notes (Signed)
Diabetes Self-Management Education  Visit Type: First/Initial  Appt. Start Time: 2:45pm  Appt. End Time: 3:40pm  04/27/2019   MyChart Visit This visit was completed via MyChart due to the COVID-19 pandemic.   I spoke with Belinda Banks and verified that I was speaking with the correct person with two patient identifiers (full name and date of birth).   I discussed the limitations related to this kind of visit and the patient is willing to proceed.  [name], identified by name and date of birth, is a 64 y.o. female with a diagnosis of Diabetes: Type 2.   ASSESSMENT  Diabetes Self-Management Education - 04/27/19 1456      Visit Information   Visit Type  First/Initial      Initial Visit   Diabetes Type  Type 2    Are you currently following a meal plan?  No    Are you taking your medications as prescribed?  Yes    Date Diagnosed  November 2020      Psychosocial Assessment   Self-care barriers  None    Self-management support  Family    Other persons present  Patient;Spouse/SO    Patient Concerns  Nutrition/Meal planning    Special Needs  Unable to determine    Preferred Learning Style  No preference indicated    Learning Readiness  Ready      Complications   Last HgB A1C per patient/outside source  7.6 %   03/06/2019   How often do you check your blood sugar?  > 4 times/day    Fasting Blood glucose range (mg/dL)  70-129    Postprandial Blood glucose range (mg/dL)  70-129;130-179      Patient Education   Previous Diabetes Education  No    Disease state   Definition of diabetes, type 1 and 2, and the diagnosis of diabetes;Factors that contribute to the development of diabetes;Explored patient's options for treatment of their diabetes    Monitoring  Taught/evaluated SMBG meter.;Identified appropriate SMBG and/or A1C goals.;Purpose and frequency of SMBG.;Taught/discussed recording of test results and interpretation of SMBG.;Interpreting lab values - A1C, lipid, urine  microalbumina.    Acute complications  Covered sick day management with medication and food.;Taught treatment of hypoglycemia - the 15 rule.;Discussed and identified patients' treatment of hyperglycemia.    Chronic complications  Relationship between chronic complications and blood glucose control;Dental care;Lipid levels, blood glucose control and heart disease      Individualized Goals (developed by patient)   Monitoring   test my blood glucose as discussed   fasting and 2 hours after each meal (4x/day total)   Reducing Risk  examine blood glucose patterns      Outcomes   Expected Outcomes  Demonstrated interest in learning. Expect positive outcomes    Future DMSE  Other (comment)   1 week   Program Status  Not Completed       Individualized Plan for Diabetes Self-Management Training:   Learning Objective:  Patient will have a greater understanding of diabetes self-management. Patient education plan is to attend individual and/or group sessions per assessed needs and concerns.   Expected Outcomes:  Demonstrated interest in learning. Expect positive outcomes  Education material provided: Core 1  If problems or questions, patient to contact team via:  Phone and Email  Future DSME appointment: Other (comment)(1 week)

## 2019-04-27 NOTE — Telephone Encounter (Signed)
Pt matrix form on [place] desk.

## 2019-04-27 NOTE — Therapy (Signed)
[facility] [address] [place], [place], [address] Phone: [phone]   Fax:  [phone]  Occupational Therapy Treatment  Patient Details  Name: Belinda Banks MRN: [id number] Date of Birth: [birth year] Referring Provider (OT): Dr. Erlinda Hong   Encounter Date: 04/27/2019  OT End of Session - 04/27/19 1326    Visit Number  5    Number of Visits  25    Date for OT Re-Evaluation  06/21/19    Authorization Type  [employer]    Authorization Time Period  60 days    Authorization - Visit Number  5    Authorization - Number of Visits  30    OT Start Time  1103    OT Stop Time  1145    OT Time Calculation (min)  42 min    Activity Tolerance  Patient tolerated treatment well    Behavior During Therapy  Restless       Past Medical History:  Diagnosis Date  . Hypertension    controlled    Past Surgical History:  Procedure Laterality Date  . ABDOMINAL HYSTERECTOMY    . BACK SURGERY    . BUBBLE STUDY  03/14/2019   Procedure: BUBBLE STUDY;  Surgeon: [name], MD;  Location: [facility];  Service: Cardiovascular;;  . TEE WITHOUT CARDIOVERSION N/A 03/14/2019   Procedure: TRANSESOPHAGEAL ECHOCARDIOGRAM (TEE);  Surgeon: [name] [name], MD;  Location: [facility] ENDOSCOPY;  Service: Cardiovascular;  Laterality: N/A;    There were no vitals filed for this visit.  Subjective Assessment - 04/27/19 1324    Subjective   Pt reports she didn't do many of her exercises last week as her eyes were bothering her    Patient is accompanied by:  Family member   husband   Pertinent History  ICH 03/05/19    Limitations  fall risk, no driving    Patient Stated Goals  improve vision    Currently in Pain?  No/denies              Treatment:completing a 12 piece puzzle with min v.c and increased time Tabletop scanning for letter cancellation, only 2 errors using line guide. Simple word search, min v.c and line guide, pt was issued as home  work. environmental scanning, 14/15 items located on first pass. Min v.c . To locate remaining item.              OT Short Term Goals - 03/23/19 1730      OT SHORT TERM GOAL #1   Title  Pt/ caregiver will be I with inital HEP.due 05/07/2019    Time  6    Period  Weeks    Status  New    Target Date  05/07/19      OT SHORT TERM GOAL #2   Title  Pt./caregiver will verbalize understanding of visual compensation strategies    Time  6    Period  Weeks      OT SHORT TERM GOAL #3   Title  Pt will perform tabletop scanning activities with 75% accuracy and min v.c    Time  6    Period  Weeks    Status  New      OT SHORT TERM GOAL #4   Title  Pt will demonstrate improved fine motor coordination for ADLS as evidenced by decreasing 9 hole peg test scaore to 50 secs or less.    Time  6    Period  Weeks  Status  New      OT SHORT TERM GOAL #5   Title  Pt will navigate a  minimally distracting environment and locate items with 75% or better accuracy.    Time  6    Period  Weeks    Status  New      Additional Short Term Goals   Additional Short Term Goals  Yes      OT SHORT TERM GOAL #6   Title  Pt will demonstrate ability to read a large print sentence with no more than min v.c    Time  6    Period  Weeks    Status  New      OT SHORT TERM GOAL #7   Title  Pt will perform simple familiar home managment tasks with no more than min A.    Time  6    Period  Weeks    Status  New        OT Long Term Goals - 03/23/19 1735      OT LONG TERM GOAL #1   Title  Pt will be modified indpendent with all basic ADLs.-06/21/2019    Time  12    Period  Weeks    Status  New    Target Date  06/21/19      OT LONG TERM GOAL #2   Title  Pt will perform mod complex home management  with supervision, min v.c    Time  12    Period  Weeks    Status  New      OT LONG TERM GOAL #3   Title  Pt will perfrom basic cooking with min A    Time  12    Period  Weeks    Status  New       OT LONG TERM GOAL #4   Title  Pt will demonstrate ability to read a short large print paragraph with no more than min v.c    Time  12    Period  Weeks    Status  New      OT LONG TERM GOAL #5   Title  Pt will perform tabletop scanning activities with 90% or better accuracy.    Time  12    Period  Weeks    Status  New      OT LONG TERM GOAL #6   Title  Pt will perform environmental scanning with 90% or better accuracy in a mod distracting environment.    Time  12    Period  Weeks    Status  New              Patient will benefit from skilled therapeutic intervention in order to improve the following deficits and impairments:           Visit Diagnosis: Attention and concentration deficit  Visuospatial deficit  Other lack of coordination  Frontal lobe and executive function deficit  Muscle weakness (generalized)    Problem List Patient Active Problem List   Diagnosis Date Noted  . CVA (cerebral vascular accident) ([facility]) 03/11/2019  . Hypertensive emergency 03/09/2019  . Hyperlipidemia 03/09/2019  . DM (diabetes mellitus) ([facility]) 03/07/2019  . HTN (hypertension) 03/07/2019  . Alcohol use 03/07/2019  . ICH (intracerebral hemorrhage) ([facility]) - Hypertensive L Basal Ganglia ICH 03/05/2019    Beecher Furio 04/27/2019, 1:27 PM  [facility] [address] [place], [place], [address] Phone: [phone]   Fax:  [phone]  Name: Belinda Banks MRN: [id number] Date of Birth: [birth year]

## 2019-04-28 ENCOUNTER — Telehealth: Payer: Self-pay

## 2019-04-28 NOTE — Telephone Encounter (Signed)
To MR. 

## 2019-04-28 NOTE — Telephone Encounter (Signed)
Completed and placed in out box.  Thank you. 

## 2019-04-28 NOTE — Telephone Encounter (Signed)
Patient called stating she spoke to Dr.Martins office and was advised that the do not do fax referrals and would need to be called in order to get her scheduled. Please follow up.

## 2019-04-28 NOTE — Telephone Encounter (Signed)
Completed, to JM/NP for review/edit/sign.

## 2019-04-28 NOTE — Telephone Encounter (Signed)
lvm for pt to call back and pay paperwork fee

## 2019-05-02 ENCOUNTER — Encounter: Payer: Managed Care, Other (non HMO) | Admitting: Speech Pathology

## 2019-05-02 ENCOUNTER — Ambulatory Visit: Payer: Managed Care, Other (non HMO) | Admitting: Occupational Therapy

## 2019-05-03 ENCOUNTER — Encounter: Payer: Self-pay | Admitting: *Deleted

## 2019-05-03 NOTE — Telephone Encounter (Signed)
Dr. Earlie [facility] for opening 08/16/19 . Patient is also on CX list .  Telephone [phone] - fax [phone]

## 2019-05-04 ENCOUNTER — Encounter: Payer: Self-pay | Admitting: Speech Pathology

## 2019-05-04 ENCOUNTER — Other Ambulatory Visit: Payer: Self-pay

## 2019-05-04 ENCOUNTER — Ambulatory Visit: Payer: Managed Care, Other (non HMO) | Admitting: Occupational Therapy

## 2019-05-04 ENCOUNTER — Ambulatory Visit: Payer: Managed Care, Other (non HMO) | Admitting: Speech Pathology

## 2019-05-04 DIAGNOSIS — R41844 Frontal lobe and executive function deficit: Secondary | ICD-10-CM

## 2019-05-04 DIAGNOSIS — R41841 Cognitive communication deficit: Secondary | ICD-10-CM

## 2019-05-04 DIAGNOSIS — M6281 Muscle weakness (generalized): Secondary | ICD-10-CM

## 2019-05-04 DIAGNOSIS — E119 Type 2 diabetes mellitus without complications: Secondary | ICD-10-CM | POA: Diagnosis not present

## 2019-05-04 DIAGNOSIS — R4701 Aphasia: Secondary | ICD-10-CM

## 2019-05-04 DIAGNOSIS — R41842 Visuospatial deficit: Secondary | ICD-10-CM

## 2019-05-04 DIAGNOSIS — R278 Other lack of coordination: Secondary | ICD-10-CM

## 2019-05-04 DIAGNOSIS — R41840 Attention and concentration deficit: Secondary | ICD-10-CM

## 2019-05-04 NOTE — Therapy (Signed)
[facility] [address] [place], [place], [address] Phone: [phone]   Fax:  [phone]  Speech Language Pathology Treatment  Patient Details  Name: Belinda Banks MRN: [id number] Date of Birth: [birth year] Referring Provider (SLP): Dr. Rosalin Hawking   Encounter Date: 05/04/2019  End of Session - 05/04/19 1018    Visit Number  11    Number of Visits  17    Date for SLP Re-Evaluation  05/18/19    SLP Start Time  0932    SLP Stop Time   [id number]    SLP Time Calculation (min)  42 min    Activity Tolerance  Patient tolerated treatment well       Past Medical History:  Diagnosis Date  . Hypertension    controlled    Past Surgical History:  Procedure Laterality Date  . ABDOMINAL HYSTERECTOMY    . BACK SURGERY    . BUBBLE STUDY  03/14/2019   Procedure: BUBBLE STUDY;  Surgeon: [name], MD;  Location: [facility];  Service: Cardiovascular;;  . TEE WITHOUT CARDIOVERSION N/A 03/14/2019   Procedure: TRANSESOPHAGEAL ECHOCARDIOGRAM (TEE);  Surgeon: [name] [name], MD;  Location: [facility] ENDOSCOPY;  Service: Cardiovascular;  Laterality: N/A;    There were no vitals filed for this visit.  Subjective Assessment - 05/04/19 0946    Subjective  "I finally got an appointment in April"  re: neuro-opthalmologist    Currently in Pain?  No/denies            ADULT SLP TREATMENT - 05/04/19 0949      General Information   Behavior/Cognition  Alert;Cooperative;Pleasant mood      Treatment Provided   Treatment provided  Cognitive-Linquistic      Cognitive-Linquistic Treatment   Treatment focused on  Patient/family/caregiver education;Aphasia    Skilled Treatment  Pt continues to report word finding difficulties. She verbalizes compensations for aphasia with occasional min A. In structured task, Rhyanna successfully utitlized descriptions for basic objects. Complex naming tasks 90% accurate with occasional min semantic  cues.       Assessment / Recommendations / Plan   Plan  Continue with current plan of care      Progression Toward Goals   Progression toward goals  Progressing toward goals       SLP Education - 05/04/19 1016    Education Details  compensations for aphasia    Person(s) Educated  Patient;Spouse    Methods  Explanation;Demonstration;Verbal cues    Comprehension  Verbalized understanding;Returned demonstration;Verbal cues required       SLP Short Term Goals - 05/04/19 1017      SLP SHORT TERM GOAL #1   Title  Pt will utilize calendar and med Environmental education officer to recall if she has taken her meds or not, twice a day with min A from spouse over 2 sessions    Status  Achieved      SLP [place] #2   Title  Pt will verbally sequence 4-5 steps in chores and meals that she did prior to CVA with rare min A over 2 sessions    Status  Achieved      SLP SHORT TERM GOAL #3   Title  Pt will complete mildly complex naming tasks with occasional min A and 90% accuracy    Status  Achieved      SLP SHORT TERM GOAL #4   Title  Pt and spouse will carryover 2 strategies for processing conversation and instructions  outside of therapy per their report over 2 sessions    Status  Achieved       SLP Long Term Goals - 05/04/19 1017      SLP LONG TERM GOAL #1   Title  Pt will manage medications 2/3x a day she takes meds with external aids and rare min A from spouse over 3 sessions    Baseline  04-18-19, 04-20-19; 04-25-19    Time  5    Period  Weeks    Status  Achieved      SLP LONG TERM GOAL #2   Title  Pt will carryover 3 compensations for attention to complete IADL's with occasional min A from spouse over 2 sessions    Baseline  04-18-19, 04-20-19    Time  3    Period  Weeks    Status  Deferred      SLP LONG TERM GOAL #3   Title  Pt will utilize compensations for aphasia in mildly complex 15 minute conversation with rare min A over 3 sessions    Time  3    Status  On-going   due to approx  once/day of anomia      Plan - 05/04/19 1017    Clinical Impression Statement  Sibley reports carrying over strategies for visual processing and utilizing sensory breaks and timers to reduce headaches and cognitive fatigue. SLP learned today pt's anomic events occur approx x1-2/day. Continue skiled ST at x1/week until pt vision improves at which time she can tolerate longer and longer tasks, possibly finding more deficits with her attention. She would cont to benefit from ST to maximxize cognition and communication for improved QOL and safety.    Speech Therapy Frequency  1x /week    Duration  --   8 weeks or 17 visits   Treatment/Interventions  Environmental controls;Language facilitation;Compensatory techniques;Cognitive reorganization;Functional tasks;SLP instruction and feedback;Compensatory strategies;Patient/family education;Multimodal communcation approach;Internal/external aids    Potential to Achieve Goals  Good       Patient will benefit from skilled therapeutic intervention in order to improve the following deficits and impairments:   Aphasia  Cognitive communication deficit    Problem List Patient Active Problem List   Diagnosis Date Noted  . CVA (cerebral vascular accident) ([facility]) 03/11/2019  . Hypertensive emergency 03/09/2019  . Hyperlipidemia 03/09/2019  . DM (diabetes mellitus) ([facility]) 03/07/2019  . HTN (hypertension) 03/07/2019  . Alcohol use 03/07/2019  . 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

## 2019-05-04 NOTE — Therapy (Signed)
[facility] [address] [place], [place], [address] Phone: [phone]   Fax:  [phone]  Occupational Therapy Treatment  Patient Details  Name: Belinda Banks MRN: [id number] Date of Birth: 04/07/1956 Referring Provider (OT): Dr. Erlinda Hong   Encounter Date: 05/04/2019  OT End of Session - 05/04/19 1507    Visit Number  6    Number of Visits  25    Date for OT Re-Evaluation  06/21/19    Authorization Type  [employer]    Authorization Time Period  60 days    Authorization - Visit Number  6    Authorization - Number of Visits  30    OT Start Time  1020    OT Stop Time  1100    OT Time Calculation (min)  40 min    Activity Tolerance  Patient tolerated treatment well    Behavior During Therapy  Restless       Past Medical History:  Diagnosis Date  . Hypertension    controlled    Past Surgical History:  Procedure Laterality Date  . ABDOMINAL HYSTERECTOMY    . BACK SURGERY    . BUBBLE STUDY  03/14/2019   Procedure: BUBBLE STUDY;  Surgeon: [name], MD;  Location: [facility];  Service: Cardiovascular;;  . TEE WITHOUT CARDIOVERSION N/A 03/14/2019   Procedure: TRANSESOPHAGEAL ECHOCARDIOGRAM (TEE);  Surgeon: [name] [name], MD;  Location: [facility] ENDOSCOPY;  Service: Cardiovascular;  Laterality: N/A;    There were no vitals filed for this visit.  Subjective Assessment - 05/04/19 1507    Subjective   Pt reports difficulty completing IADLs due to visual deficits    Patient is accompanied by:  Family member   husband   Pertinent History  ICH 03/05/19    Limitations  fall risk, no driving    Patient Stated Goals  improve vision    Currently in Pain?  No/denies                Treatment: copying small peg design with LUE for increased fine motor coordination and visual skills, min v.c and increased time. Reading a schedule and answering questions for visual scanning and cognition, mod  errors. Environmental scanning 9/10 items located on first task, therapist discontinued task before pt located remaining 3 items due to pt's visual fatigue.  Pt reports she has difficulty completing a simple cooking task due to her vision. Therapist encouraged pt to task a seated rest break if she needed during kitchen tasks if needed rather than going back to bed.Pt was instructed to take a rest break only after ensuring that every thing was safe on the stove Pt was instructed to have her husband assist with any cooking tasks for safety. Arm bike x 5 mins level 3 for conditioning         OT Short Term Goals - 03/23/19 1730      OT SHORT TERM GOAL #1   Title  Pt/ caregiver will be I with inital HEP.due 05/07/2019    Time  6    Period  Weeks    Status  New    Target Date  05/07/19      OT SHORT TERM GOAL #2   Title  Pt./caregiver will verbalize understanding of visual compensation strategies    Time  6    Period  Weeks      OT SHORT TERM GOAL #3   Title  Pt will perform tabletop scanning activities with  75% accuracy and min v.c    Time  6    Period  Weeks    Status  New      OT SHORT TERM GOAL #4   Title  Pt will demonstrate improved fine motor coordination for ADLS as evidenced by decreasing 9 hole peg test scaore to 50 secs or less.    Time  6    Period  Weeks    Status  New      OT SHORT TERM GOAL #5   Title  Pt will navigate a  minimally distracting environment and locate items with 75% or better accuracy.    Time  6    Period  Weeks    Status  New      Additional Short Term Goals   Additional Short Term Goals  Yes      OT SHORT TERM GOAL #6   Title  Pt will demonstrate ability to read a large print sentence with no more than min v.c    Time  6    Period  Weeks    Status  New      OT SHORT TERM GOAL #7   Title  Pt will perform simple familiar home managment tasks with no more than min A.    Time  6    Period  Weeks    Status  New        OT Long Term  Goals - 03/23/19 1735      OT LONG TERM GOAL #1   Title  Pt will be modified indpendent with all basic ADLs.-06/21/2019    Time  12    Period  Weeks    Status  New    Target Date  06/21/19      OT LONG TERM GOAL #2   Title  Pt will perform mod complex home management  with supervision, min v.c    Time  12    Period  Weeks    Status  New      OT LONG TERM GOAL #3   Title  Pt will perfrom basic cooking with min A    Time  12    Period  Weeks    Status  New      OT LONG TERM GOAL #4   Title  Pt will demonstrate ability to read a short large print paragraph with no more than min v.c    Time  12    Period  Weeks    Status  New      OT LONG TERM GOAL #5   Title  Pt will perform tabletop scanning activities with 90% or better accuracy.    Time  12    Period  Weeks    Status  New      OT LONG TERM GOAL #6   Title  Pt will perform environmental scanning with 90% or better accuracy in a mod distracting environment.    Time  12    Period  Weeks    Status  New            Plan - 05/04/19 1508    Clinical Impression Statement  Pt is progressing towards goals. She was encouraged to try to stick with IADL tasks to completion. (Pt reports stopping tasks to go lie in the bed.)    Occupational performance deficits (Please refer to evaluation for details):  ADL's;IADL's;Work;Leisure;Social Participation    Body Structure / Function / Physical Skills  ADL;UE functional  use;Flexibility;Vision;FMC;Cardiopulmonary status limiting activity;Balance;Continence;Gait;ROM;GMC;Coordination;Decreased knowledge of precautions;IADL;Decreased knowledge of use of DME;Dexterity;Mobility;Strength    Rehab Potential  Good    OT Frequency  2x / week    OT Duration  12 weeks    OT Treatment/Interventions  Self-care/ADL training;Visual/perceptual remediation/compensation;Patient/family education;Aquatic Therapy;DME and/or AE instruction;Paraffin;Passive range of motion;Balance  training;Cryotherapy;Fluidtherapy;Functional Mobility Training;Moist Heat;Therapeutic exercise;Manual Therapy;Therapeutic activities;Neuromuscular education;Cognitive remediation/compensation    Plan  continue tabletop scanning/visual skills, coordination, functional tasks    Consulted and Agree with Plan of Care  Patient;Family member/caregiver    Family Member Consulted  husband       Patient will benefit from skilled therapeutic intervention in order to improve the following deficits and impairments:   Body Structure / Function / Physical Skills: ADL, UE functional use, Flexibility, Vision, FMC, Cardiopulmonary status limiting activity, Balance, Continence, Gait, ROM, GMC, Coordination, Decreased knowledge of precautions, IADL, Decreased knowledge of use of DME, Dexterity, Mobility, Strength       Visit Diagnosis: Attention and concentration deficit  Visuospatial deficit  Other lack of coordination  Frontal lobe and executive function deficit  Muscle weakness (generalized)    Problem List Patient Active Problem List   Diagnosis Date Noted  . CVA (cerebral vascular accident) ([facility]) 03/11/2019  . Hypertensive emergency 03/09/2019  . Hyperlipidemia 03/09/2019  . DM (diabetes mellitus) ([facility]) 03/07/2019  . HTN (hypertension) 03/07/2019  . Alcohol use 03/07/2019  . ICH (intracerebral hemorrhage) ([facility]) - Hypertensive L Basal Ganglia ICH 03/05/2019    Belinda Banks 05/04/2019, 3:09 PM  [facility] [address] [place], [place], [address] Phone: [phone]   Fax:  [phone]  Name: Belinda Banks MRN: [id number] Date of Birth: 1956/02/13

## 2019-05-05 ENCOUNTER — Encounter: Payer: Self-pay | Admitting: Dietician

## 2019-05-05 ENCOUNTER — Encounter: Payer: Managed Care, Other (non HMO) | Admitting: Dietician

## 2019-05-05 DIAGNOSIS — E119 Type 2 diabetes mellitus without complications: Secondary | ICD-10-CM

## 2019-05-05 NOTE — Progress Notes (Signed)
Diabetes Self-Management Education  Visit Type: Follow-up  Appt. Start Time: 11:30am  Appt. End Time: 12:25pm  05/05/2019   MyChart Visit  This visit was completed via MyChart due to the COVID-19 pandemic.   I spoke with Belinda Banks and verified that I was speaking with the correct person with two patient identifiers (full name and date of birth).   I discussed the limitations related to this kind of visit and the patient is willing to proceed.  [name], identified by name and date of birth, is a 64 y.o. female with a diagnosis of Diabetes: Type 2.   ASSESSMENT  Diabetes Self-Management Education - 05/05/19 1133      Visit Information   Visit Type  Follow-up      Complications   How often do you check your blood sugar?  3-4 times/day    Fasting Blood glucose range (mg/dL)  70-129    Postprandial Blood glucose range (mg/dL)  70-129;130-179      Dietary Intake   Breakfast  oatmeal + fruit    Lunch  PB&J    Dinner  whole wheat spaggheti + salad    Beverage(s)  water, diet tea, diet lemonade, diet soda      Patient Education   Nutrition management   Role of diet in the treatment of diabetes and the relationship between the three main macronutrients and blood glucose level;Food label reading, portion sizes and measuring food.;Carbohydrate counting;Effects of alcohol on blood glucose and safety factors with consumption of alcohol.;Information on hints to eating out and maintain blood glucose control.;Meal options for control of blood glucose level and chronic complications.      Individualized Goals (developed by patient)   Nutrition  Follow meal plan discussed      Outcomes   Expected Outcomes  Demonstrated interest in learning. Expect positive outcomes    Future DMSE  Other (comment)   1 week   Program Status  Not Completed      Subsequent Visit   Since your last visit have you continued or begun to take your medications as prescribed?  Yes    Since your last  visit, are you checking your blood glucose at least once a day?  Yes       Individualized Plan for Diabetes Self-Management Training:   Learning Objective:  Patient will have a greater understanding of diabetes self-management. Patient education plan is to attend individual and/or group sessions per assessed needs and concerns.   Expected Outcomes:  Demonstrated interest in learning. Expect positive outcomes  Education material provided: Core 2  If problems or questions, patient to contact team via:  Phone and Email  Future DSME appointment: Other (comment)(1 week)

## 2019-05-06 ENCOUNTER — Ambulatory Visit: Payer: Managed Care, Other (non HMO) | Admitting: Family Medicine

## 2019-05-11 ENCOUNTER — Other Ambulatory Visit: Payer: Self-pay

## 2019-05-11 ENCOUNTER — Encounter: Payer: Self-pay | Admitting: Speech Pathology

## 2019-05-11 ENCOUNTER — Ambulatory Visit: Payer: Managed Care, Other (non HMO) | Admitting: Speech Pathology

## 2019-05-11 ENCOUNTER — Ambulatory Visit: Payer: Managed Care, Other (non HMO) | Admitting: Occupational Therapy

## 2019-05-11 DIAGNOSIS — R41842 Visuospatial deficit: Secondary | ICD-10-CM

## 2019-05-11 DIAGNOSIS — R278 Other lack of coordination: Secondary | ICD-10-CM

## 2019-05-11 DIAGNOSIS — R4701 Aphasia: Secondary | ICD-10-CM

## 2019-05-11 DIAGNOSIS — E119 Type 2 diabetes mellitus without complications: Secondary | ICD-10-CM | POA: Diagnosis not present

## 2019-05-11 NOTE — Therapy (Signed)
[facility] [address] [place], [place], [address] Phone: [phone]   Fax:  [phone]  Occupational Therapy Treatment  Patient Details  Name: Belinda Banks MRN: [id number] Date of Birth: [birth year] Referring Provider (OT): Dr. Erlinda Hong   Encounter Date: 05/11/2019  OT End of Session - 05/11/19 0925    Visit Number  7    Number of Visits  25    Date for OT Re-Evaluation  06/21/19    Authorization Type  [employer]    Authorization Time Period  60 days    Authorization - Visit Number  7    Authorization - Number of Visits  30    OT Start Time  0845    OT Stop Time  0930    OT Time Calculation (min)  45 min    Activity Tolerance  Patient tolerated treatment well    Behavior During Therapy  [facility] for tasks assessed/performed       Past Medical History:  Diagnosis Date  . Hypertension    controlled    Past Surgical History:  Procedure Laterality Date  . ABDOMINAL HYSTERECTOMY    . BACK SURGERY    . BUBBLE STUDY  03/14/2019   Procedure: BUBBLE STUDY;  Surgeon: [name], MD;  Location: [facility];  Service: Cardiovascular;;  . TEE WITHOUT CARDIOVERSION N/A 03/14/2019   Procedure: TRANSESOPHAGEAL ECHOCARDIOGRAM (TEE);  Surgeon: [name] [name], MD;  Location: [facility] ENDOSCOPY;  Service: Cardiovascular;  Laterality: N/A;    There were no vitals filed for this visit.  Subjective Assessment - 05/11/19 0850    Subjective   My Lt arm/hand gets to shaking when I try and use it. It's been doing that for over a month    Pertinent History  ICH 03/05/19    Limitations  fall risk, no driving    Patient Stated Goals  improve vision    Currently in Pain?  No/denies         [facility] OT Assessment - 05/11/19 0001      Coordination   Right 9 Hole Peg Test  26.09 sec    Left 9 Hole Peg Test  21.88 sec       Assessed STG's and progress to date. Reviewed visual compensatory strategies and reprinted handout.  Practiced  reading large print sentence w/ improvements. Assessed coordination (see above). Reinforced gradually increased visual stamina and not going back to bed, but just resting eyes for a few minutes, then returning to task. Recommended timing herself and then gradually increasing time.  Tabletop scanning to match digital to analogue times of clock w/ extra time required and cues to use organized scanning pattern, visualizing the time on the clock before searching for it, and double checking herself.  Pt reports blind spots in vision (not necessarily just one side or the other but sporadically and even in central vision)                   OT Short Term Goals - 05/11/19 0923      OT SHORT TERM GOAL #1   Title  Pt/ caregiver will be I with inital HEP.due 05/07/2019    Time  6    Period  Weeks    Status  Achieved    Target Date  05/07/19      OT SHORT TERM GOAL #2   Title  Pt./caregiver will verbalize understanding of visual compensation strategies    Time  6  Period  Weeks    Status  Achieved      OT SHORT TERM GOAL #3   Title  Pt will perform tabletop scanning activities with 75% accuracy and min v.c    Time  6    Period  Weeks    Status  Achieved      OT SHORT TERM GOAL #4   Title  Pt will demonstrate improved fine motor coordination for ADLS as evidenced by decreasing 9 hole peg test scaore to 50 secs or less.    Time  6    Period  Weeks    Status  Achieved   Rt = 26.09 sec, Lt = 21.88 sec     OT SHORT TERM GOAL #5   Title  Pt will navigate a  minimally distracting environment and locate items with 75% or better accuracy.    Time  6    Period  Weeks    Status  Achieved      OT SHORT TERM GOAL #6   Title  Pt will demonstrate ability to read a large print sentence with no more than min v.c    Time  6    Period  Weeks    Status  Achieved      OT SHORT TERM GOAL #7   Title  Pt will perform simple familiar home managment tasks with no more than min A.    Time  6     Period  Weeks    Status  Achieved   Pt reports doing laundry and simple cooking w/ supervision/min assist prn       OT Long Term Goals - 03/23/19 1735      OT LONG TERM GOAL #1   Title  Pt will be modified indpendent with all basic ADLs.-06/21/2019    Time  12    Period  Weeks    Status  New    Target Date  06/21/19      OT LONG TERM GOAL #2   Title  Pt will perform mod complex home management  with supervision, min v.c    Time  12    Period  Weeks    Status  New      OT LONG TERM GOAL #3   Title  Pt will perfrom basic cooking with min A    Time  12    Period  Weeks    Status  New      OT LONG TERM GOAL #4   Title  Pt will demonstrate ability to read a short large print paragraph with no more than min v.c    Time  12    Period  Weeks    Status  New      OT LONG TERM GOAL #5   Title  Pt will perform tabletop scanning activities with 90% or better accuracy.    Time  12    Period  Weeks    Status  New      OT LONG TERM GOAL #6   Title  Pt will perform environmental scanning with 90% or better accuracy in a mod distracting environment.    Time  12    Period  Weeks    Status  New            Plan - 05/11/19 2831    Clinical Impression Statement  Pt has met all STG's. Pt has significantly improved in coordination bilaterally. Pt has also improved in visual compensations but still  fatigues. ? scotomas    Occupational performance deficits (Please refer to evaluation for details):  ADL's;IADL's;Work;Leisure;Social Participation    Body Structure / Function / Physical Skills  ADL;UE functional use;Flexibility;Vision;FMC;Cardiopulmonary status limiting activity;Balance;Continence;Gait;ROM;GMC;Coordination;Decreased knowledge of precautions;IADL;Decreased knowledge of use of DME;Dexterity;Mobility;Strength    Rehab Potential  Good    OT Frequency  2x / week    OT Duration  12 weeks    OT Treatment/Interventions  Self-care/ADL training;Visual/perceptual  remediation/compensation;Patient/family education;Aquatic Therapy;DME and/or AE instruction;Paraffin;Passive range of motion;Balance training;Cryotherapy;Fluidtherapy;Functional Mobility Training;Moist Heat;Therapeutic exercise;Manual Therapy;Therapeutic activities;Neuromuscular education;Cognitive remediation/compensation    Plan  continue tabletop scanning/visual skills, coordination, functional tasks    Consulted and Agree with Plan of Care  Patient;Family member/caregiver    Family Member Consulted  husband       Patient will benefit from skilled therapeutic intervention in order to improve the following deficits and impairments:   Body Structure / Function / Physical Skills: ADL, UE functional use, Flexibility, Vision, FMC, Cardiopulmonary status limiting activity, Balance, Continence, Gait, ROM, GMC, Coordination, Decreased knowledge of precautions, IADL, Decreased knowledge of use of DME, Dexterity, Mobility, Strength       Visit Diagnosis: Visuospatial deficit  Other lack of coordination    Problem List Patient Active Problem List   Diagnosis Date Noted  . CVA (cerebral vascular accident) ([place]) 03/11/2019  . Hypertensive emergency 03/09/2019  . Hyperlipidemia 03/09/2019  . DM (diabetes mellitus) ([facility]) 03/07/2019  . HTN (hypertension) 03/07/2019  . Alcohol use 03/07/2019  . ICH (intracerebral hemorrhage) ([place]) - Hypertensive L Basal Ganglia ICH 03/05/2019    Carey Bullocks, OTR/L 05/11/2019, 1:10 PM  [facility] [address] [place], [place], [address] Phone: [phone]   Fax:  [phone]  Name: Belinda Banks MRN: [id number] Date of Birth: 07-29-1955

## 2019-05-11 NOTE — Therapy (Signed)
[facility] [address] [place], [place], [address] Phone: [phone]   Fax:  [phone]  Speech Language Pathology Treatment  Patient Details  Name: Belinda Banks MRN: [id number] Date of Birth: 11-May-1955 Referring Provider (SLP): Dr. Rosalin Hawking   Encounter Date: 05/11/2019  End of Session - 05/11/19 1200    Visit Number  12    Date for SLP Re-Evaluation  05/18/19    SLP Start Time  0930    SLP Stop Time   [id number]    SLP Time Calculation (min)  42 min       Past Medical History:  Diagnosis Date  . Hypertension    controlled    Past Surgical History:  Procedure Laterality Date  . ABDOMINAL HYSTERECTOMY    . BACK SURGERY    . BUBBLE STUDY  03/14/2019   Procedure: BUBBLE STUDY;  Surgeon: [name], MD;  Location: [facility];  Service: Cardiovascular;;  . TEE WITHOUT CARDIOVERSION N/A 03/14/2019   Procedure: TRANSESOPHAGEAL ECHOCARDIOGRAM (TEE);  Surgeon: [name] [name], MD;  Location: [facility] ENDOSCOPY;  Service: Cardiovascular;  Laterality: N/A;    There were no vitals filed for this visit.  Subjective Assessment - 05/11/19 0937    Subjective  "She is good at that" re": describing    Patient is accompained by:  Family member    Currently in Pain?  No/denies            ADULT SLP TREATMENT - 05/11/19 0950      General Information   Behavior/Cognition  Alert;Cooperative;Pleasant mood      Treatment Provided   Treatment provided  Cognitive-Linquistic      Cognitive-Linquistic Treatment   Treatment focused on  Patient/family/caregiver education;Aphasia    Skilled Treatment  Word finding impairments continue but are improved per pt report. Complex naming today with 85% accuracy. She reports using compensations for anomia at home. Today she required verbal cues to use compensations 3x.       Assessment / Recommendations / Plan   Plan  Continue with current plan of care      Progression  Toward Goals   Progression toward goals  Progressing toward goals         SLP Short Term Goals - 05/11/19 1200      SLP SHORT TERM GOAL #1   Title  Pt will utilize calendar and med Environmental education officer to recall if she has taken her meds or not, twice a day with min A from spouse over 2 sessions    Status  Achieved      SLP SHORT TERM GOAL #2   Title  Pt will verbally sequence 4-5 steps in chores and meals that she did prior to CVA with rare min A over 2 sessions    Status  Achieved      SLP SHORT TERM GOAL #3   Title  Pt will complete mildly complex naming tasks with occasional min A and 90% accuracy    Status  Achieved      SLP SHORT TERM GOAL #4   Title  Pt and spouse will carryover 2 strategies for processing conversation and instructions outside of therapy per their report over 2 sessions    Status  Achieved       SLP Long Term Goals - 05/11/19 1200      SLP LONG TERM GOAL #1   Title  Pt will manage medications 2/3x a day she takes meds with external aids and  rare min A from spouse over 3 sessions    Baseline  04-18-19, 04-20-19; 04-25-19    Time  5    Period  Weeks    Status  Achieved      SLP LONG TERM GOAL #2   Title  Pt will carryover 3 compensations for attention to complete IADL's with occasional min A from spouse over 2 sessions    Baseline  04-18-19, 04-20-19    Time  3    Period  Weeks    Status  Deferred      SLP LONG TERM GOAL #3   Title  Pt will utilize compensations for aphasia in mildly complex 15 minute conversation with rare min A over 3 sessions    Time  3    Status  On-going   due to approx once/day of anomia      Plan - 05/11/19 1158    Clinical Impression Statement  Belinda Banks reports carrying over strategies for visual processing and utilizing sensory breaks and timers to reduce headaches and cognitive fatigue. SLP learned today pt's anomic events occur approx x1-2/day. Continue skiled ST at x1/week until pt vision improves at which time she can tolerate  longer and longer tasks, possibly finding more deficits with her attention. She would cont to benefit from ST to maximxize cognition and communication for improved QOL and safety 1 more visit as language is improved and visual impairments persist, affecting her ability to complete IADL's.    Speech Therapy Frequency  1x /week    Treatment/Interventions  Environmental controls;Language facilitation;Compensatory techniques;Cognitive reorganization;Functional tasks;SLP instruction and feedback;Compensatory strategies;Patient/family education;Multimodal communcation approach;Internal/external aids       Patient will benefit from skilled therapeutic intervention in order to improve the following deficits and impairments:   Aphasia    Problem List Patient Active Problem List   Diagnosis Date Noted  . CVA (cerebral vascular accident) ([facility]) 03/11/2019  . Hypertensive emergency 03/09/2019  . Hyperlipidemia 03/09/2019  . DM (diabetes mellitus) ([place]) 03/07/2019  . HTN (hypertension) 03/07/2019  . Alcohol use 03/07/2019  . ICH (intracerebral hemorrhage) ([place]) - Hypertensive L Basal Ganglia ICH 03/05/2019    Belinda Banks, Annye Rusk MS, CCC-SLP 05/11/2019, 12:01 PM  [facility] [address] [place], [place], [address] Phone: [phone]   Fax:  [phone]   Name: Belinda Banks MRN: [id number] Date of Birth: [birth year]

## 2019-05-12 ENCOUNTER — Encounter: Payer: Self-pay | Admitting: Family Medicine

## 2019-05-12 ENCOUNTER — Encounter: Payer: Self-pay | Admitting: Dietician

## 2019-05-12 ENCOUNTER — Encounter: Payer: Managed Care, Other (non HMO) | Admitting: Dietician

## 2019-05-12 ENCOUNTER — Ambulatory Visit (INDEPENDENT_AMBULATORY_CARE_PROVIDER_SITE_OTHER): Payer: Managed Care, Other (non HMO) | Admitting: Family Medicine

## 2019-05-12 VITALS — BP 162/97 | HR 90 | Temp 97.2°F | Resp 17 | Ht 68.0 in | Wt 173.0 lb

## 2019-05-12 DIAGNOSIS — I10 Essential (primary) hypertension: Secondary | ICD-10-CM | POA: Diagnosis not present

## 2019-05-12 DIAGNOSIS — I610 Nontraumatic intracerebral hemorrhage in hemisphere, subcortical: Secondary | ICD-10-CM | POA: Diagnosis not present

## 2019-05-12 DIAGNOSIS — B079 Viral wart, unspecified: Secondary | ICD-10-CM

## 2019-05-12 DIAGNOSIS — E119 Type 2 diabetes mellitus without complications: Secondary | ICD-10-CM | POA: Diagnosis not present

## 2019-05-12 DIAGNOSIS — E1159 Type 2 diabetes mellitus with other circulatory complications: Secondary | ICD-10-CM | POA: Diagnosis not present

## 2019-05-12 MED ORDER — ONETOUCH ULTRASOFT LANCETS MISC: 12 refills | Status: DC

## 2019-05-12 MED ORDER — ONETOUCH VERIO W/DEVICE KIT: PACK | 0 refills | Status: DC

## 2019-05-12 MED ORDER — GLUCOSE BLOOD VI STRP: ORAL_STRIP | 12 refills | Status: DC

## 2019-05-12 NOTE — Patient Instructions (Signed)
For COVID-19 information :  [email or web address]

## 2019-05-12 NOTE — Progress Notes (Signed)
Subjective:  Patient ID: Belinda Banks, female    DOB: 1955/11/24  Age: 64 y.o. MRN: [id number]  CC: Establish Care (is fasting), Hospitalization Follow-up (CVA), and Medication Management (was told that the Statin caused her stroke & was told to stop it. Neuro prescribed it again)   HPI [name] is a 64 year old female with a history of newly diagnosed Type 2 Diabetes Mellitus (A1c 7.6), Hypertension, Hyperlipidemia hospitalized for Intracranial Hemorrhage on 03/05/2019 and on 03/11/19 for subacute bilateral cerebral infarcts. On initial presentation her systolic blood pressure was 230/158 and she had R sided weakness and R facial droop;Ct head revealed ICH She was evaluated by the stroke team, antihypertensive, statin initiated; also diagnosed with DM and discharged to outpatient PT. One week later she was readmitted with symptoms of confusion and weakness, found to have  Multiple chronic lacunar infarcts. Not a candidate for long term anticoagulation per Neurology but placed on aspirin.  Echo revealed - EF 50-55%, LVH TEE was negative for embolus, vegetation or patent foramen ovale  CT head 03/05/2019: IMPRESSION: 1. 2.1 x 1.3 x 2.8 cm acute intraparenchymal hemorrhage centered at the left lentiform nucleus, estimated volume 3.8 cc. No significant regional mass effect. 2. Underlying mild age-related cerebral atrophy with chronic small vessel ischemic disease.  CT head 03/11/2019:  IMPRESSION: Grossly stable intraparenchymal hemorrhage is noted in the left basal ganglia compared to prior exam. New low density is noted involving the left posterior parietal cortex with sulcal effacement concerning for acute infarction.  MRI brain 03/11/2019: IMPRESSION: 1. No pathologic enhancement within the brain. 2. Unchanged subacute hemorrhage positioned at the posterior left lentiform nucleus without significant regional mass effect. 3. Multiple additional acute to subacute cerebral  infarcts, better assessed on recent noncontrast MRI  She had a follow-up visit with neurology on 04/20/2019; Lipitor has been on hold due to elevated liver enzymes.  She continues to have jerking of her left arm and EEG was negative for seizures.  She also has visual field abnormalities and was referred to ophthalmology. Currently undergoing PT and OT At home BP has been in the 120/80 range but it is elevated today because she is yet to take her antihypertensive; blood pressure was 117/79 at neurology visit. With regards to diabetes, she has 90 - low 100s for her blood sugars. The highest was 154. She noticed a mole on her back 3 weeks ago and would like this checked out by dermatology Past Medical History:  Diagnosis Date  . Hypertension    controlled    Past Surgical History:  Procedure Laterality Date  . ABDOMINAL HYSTERECTOMY    . BACK SURGERY    . BUBBLE STUDY  03/14/2019   Procedure: BUBBLE STUDY;  Surgeon: [name], MD;  Location: [facility];  Service: Cardiovascular;;  . TEE WITHOUT CARDIOVERSION N/A 03/14/2019   Procedure: TRANSESOPHAGEAL ECHOCARDIOGRAM (TEE);  Surgeon: [name] [name], MD;  Location: [facility] ENDOSCOPY;  Service: Cardiovascular;  Laterality: N/A;    No family history on file.  Allergies  Allergen Reactions  . Codeine Other (See Comments)    Made her loopy    Outpatient Medications Prior to Visit  Medication Sig Dispense Refill  . amLODipine (NORVASC) 10 MG tablet Take 1 tablet (10 mg total) by mouth daily. 30 tablet 2  . Artificial Tear Ointment (DRY EYES OP) Place 1 drop into both eyes daily as needed (dry eyes).     Marland Kitchen aspirin 81 MG chewable tablet Chew 1 tablet (  81 mg total) by mouth daily. 90 tablet 0  . labetalol (NORMODYNE) 100 MG tablet Take 1 tablet (100 mg total) by mouth 3 (three) times daily. 90 tablet 2  . losartan (COZAAR) 50 MG tablet Take 1 tablet (50 mg total) by mouth 2 (two) times daily. 60 tablet 2  . metFORMIN (GLUCOPHAGE) 500  MG tablet Take 1 tablet (500 mg total) by mouth 2 (two) times daily with a meal. 60 tablet 2  . atorvastatin (LIPITOR) 20 MG tablet Take 1 tablet (20 mg total) by mouth daily. (Patient not taking: Reported on 05/12/2019) 30 tablet 3  . blood glucose meter kit and supplies Dispense based on patient and insurance preference. Use up to four times daily as directed. (FOR ICD-10 E10.9, E11.9). (Patient not taking: Reported on 05/12/2019) 1 each 0   No facility-administered medications prior to visit.     ROS Review of Systems  Constitutional: Negative for activity change, appetite change and fatigue.  HENT: Negative for congestion, sinus pressure and sore throat.   Eyes: Negative for visual disturbance.  Respiratory: Negative for cough, chest tightness, shortness of breath and wheezing.   Cardiovascular: Negative for chest pain and palpitations.  Gastrointestinal: Negative for abdominal distention, abdominal pain and constipation.  Endocrine: Negative for polydipsia.  Genitourinary: Negative for dysuria and frequency.  Musculoskeletal: Negative for arthralgias and back pain.  Skin: Positive for rash.  Neurological: Negative for tremors, light-headedness and numbness.  Hematological: Does not bruise/bleed easily.  Psychiatric/Behavioral: Negative for agitation and behavioral problems.    Objective:  BP (!) 162/97 Comment: hasn't taken BP meds today  Pulse 90   Temp (!) 97.2 F (36.2 C) (Temporal)   Resp 17   Ht '5\' 8"'$  (1.727 m)   Wt 173 lb (78.5 kg)   SpO2 98%   BMI 26.30 kg/m   BP/Weight 05/12/2019 04/20/2019 68/11/8108  Systolic BP 315 945 859  Diastolic BP 97 79 84  Wt. (Lbs) 173 181.6 -  BMI 26.3 27.61 -      Physical Exam Constitutional:      Appearance: She is well-developed.  Neck:     Vascular: No JVD.  Cardiovascular:     Rate and Rhythm: Normal rate.     Heart sounds: Normal heart sounds. No murmur.  Pulmonary:     Effort: Pulmonary effort is normal.     Breath  sounds: Normal breath sounds. No wheezing or rales.  Chest:     Chest wall: No tenderness.  Abdominal:     General: Bowel sounds are normal. There is no distension.     Palpations: Abdomen is soft. There is no mass.     Tenderness: There is no abdominal tenderness.  Musculoskeletal:        General: Normal range of motion.     Right lower leg: No edema.     Left lower leg: No edema.  Skin:    Comments: Lesion on mid upper back which is hyperpigmented, raised with corrugated surface  Neurological:     Mental Status: She is alert and oriented to person, place, and time.     Coordination: Coordination normal.     Gait: Gait normal.  Psychiatric:        Mood and Affect: Mood normal.     CMP Latest Ref Rng & Units 04/20/2019 03/15/2019 03/14/2019  Glucose 65 - 99 mg/dL 128(H) 118(H) 109(H)  BUN 8 - 27 mg/dL '13 9 8  '$ Creatinine 0.57 - 1.00 mg/dL 0.97 0.59 0.57  Sodium 134 - 144 mmol/L 139 135 136  Potassium 3.5 - 5.2 mmol/L 4.8 3.6 3.7  Chloride 96 - 106 mmol/L 100 102 104  CO2 20 - 29 mmol/L '23 23 23  '$ Calcium 8.7 - 10.3 mg/dL 10.6(H) 9.1 9.0  Total Protein 6.0 - 8.5 g/dL 7.4 6.6 6.5  Total Bilirubin 0.0 - 1.2 mg/dL 1.0 1.9(H) 2.2(H)  Alkaline Phos 39 - 117 IU/L 91 71 64  AST 0 - 40 IU/L 26 51(H) 61(H)  ALT 0 - 32 IU/L 32 87(H) 90(H)    Lipid Panel     Component Value Date/Time   CHOL 191 03/06/2019 0445   TRIG 200 (H) 03/06/2019 0445   HDL 45 03/06/2019 0445   CHOLHDL 4.2 03/06/2019 0445   VLDL 40 03/06/2019 0445   LDLCALC 106 (H) 03/06/2019 0445    CBC    Component Value Date/Time   WBC 10.1 03/15/2019 0338   RBC 4.13 03/15/2019 0338   HGB 15.2 (H) 03/15/2019 0338   HCT 40.7 03/15/2019 0338   PLT 199 03/15/2019 0338   MCV 98.5 03/15/2019 0338   MCH 36.8 (H) 03/15/2019 0338   MCHC 37.3 (H) 03/15/2019 0338   RDW 11.4 (L) 03/15/2019 0338   LYMPHSABS 0.6 (L) 03/11/2019 1037   MONOABS 0.4 03/11/2019 1037   EOSABS 0.0 03/11/2019 1037   BASOSABS 0.0 03/11/2019 1037     Lab Results  Component Value Date   HGBA1C 7.6 (H) 03/06/2019    Assessment & Plan:  1. Nontraumatic subcortical hemorrhage of left cerebral hemisphere [facility]) History of recent [place] followed by embolic CVA Will need loop recorder placement by Cardiology Risk factor modification including optimization of blood pressure, glucose and cholesterol Advised to resume statin as LFTs have normalized; repeat LFTs at next visit Continue occupation therapy - Ambulatory referral to Cardiology  2. Type 2 diabetes mellitus with other circulatory complication, without long-term current use of insulin (HCC) Not fully optimized with A1c of 7.6; goal A1c is <6.5 Blood sugars are at goal; no regimen change today A1c is due next months Counseled on Diabetic diet, my plate method, 022 minutes of moderate intensity exercise/week Blood sugar logs with fasting goals of 80-120 mg/dl, random of less than 180 and in the event of sugars less than 60 mg/dl or greater than 400 mg/dl encouraged to notify the clinic. Advised on the need for annual eye exams, annual foot exams, Pneumonia vaccine. - glucose blood test strip; Use as instructed 3 times daily before meals  Dispense: 100 each; Refill: 12 - Lancets (ONETOUCH ULTRASOFT) lancets; Use as instructed 3 times daily before meals  Dispense: 100 each; Refill: 12 - Blood Glucose Monitoring Suppl (ONETOUCH VERIO) w/Device KIT; Use 3 times daily before meals.  Dispense: 1 kit; Refill: 0  3. Essential hypertension Uncontrolled as she is yet to take her antihypertensive No regimen change Counseled on blood pressure goal of less than 130/80, low-sodium, DASH diet, medication compliance, 150 minutes of moderate intensity exercise per week. Discussed medication compliance, adverse effects.   4. Viral warts, unspecified type - Ambulatory referral to Dermatology    Health Care Maintenance: will need to be addressed at next visit  No orders of the defined types were  placed in this encounter.   Follow-up: Return in about 1 month (around 06/12/2019) for labs and to establish with Dr Juleen China.       [name], MD, FAAFP. [facility] [place], [place]   05/12/2019, 9:01 AM

## 2019-05-12 NOTE — Progress Notes (Signed)
Diabetes Self-Management Education  Visit Type: Follow-up  Appt. Start Time: 12:10pm  Appt. End Time: 12:40pm  05/12/2019   MyChart Visit  This visit was completed via telephone due to the COVID-19 pandemic.   I spoke with Belinda Banks and verified that I was speaking with the correct person with two patient identifiers (full name and date of birth).   I discussed the limitations related to this kind of visit and the patient is willing to proceed.  [name], identified by name and date of birth, is a 65 y.o. female with a diagnosis of Diabetes: Type 2.   ASSESSMENT  Diabetes Self-Management Education - 05/12/19 1200      Visit Information   Visit Type  Follow-up      Initial Visit   Diabetes Type  Type 2      Complications   How often do you check your blood sugar?  3-4 times/day    Fasting Blood glucose range (mg/dL)  70-129    Postprandial Blood glucose range (mg/dL)  70-129;130-179      Exercise   Exercise Type  ADL's      Patient Education   Physical activity and exercise   Role of exercise on diabetes management, blood pressure control and cardiac health.;Identified with patient nutritional and/or medication changes necessary with exercise.;Helped patient identify appropriate exercises in relation to his/her diabetes, diabetes complications and other health issue.    Medications  Reviewed patients medication for diabetes, action, purpose, timing of dose and side effects.;Reviewed medication adjustment guidelines for hyperglycemia and sick days.    Psychosocial adjustment  Worked with patient to identify barriers to care and solutions;Role of stress on diabetes;Helped patient identify a support system for diabetes management;Travel strategies;Identified and addressed patients feelings and concerns about diabetes;Brainstormed with patient on coping mechanisms for social situations, getting support from significant others, dealing with feelings about diabetes      Individualized Goals (developed by patient)   Medications  take my medication as prescribed    Reducing Risk  examine blood glucose patterns;get labs drawn;do foot checks daily;treat hypoglycemia with 15 grams of carbs if blood glucose less than 70mg /dL    Health Coping  ask for help with (comment)      Outcomes   Expected Outcomes  Demonstrated interest in learning. Expect positive outcomes    Future DMSE  PRN    Program Status  Completed      Subsequent Visit   Since your last visit have you continued or begun to take your medications as prescribed?  Yes    Since your last visit, are you checking your blood glucose at least once a day?  Yes       Individualized Plan for Diabetes Self-Management Training:   Learning Objective:  Patient will have a greater understanding of diabetes self-management. Patient education plan is to attend individual and/or group sessions per assessed needs and concerns.   Expected Outcomes:  Demonstrated interest in learning. Expect positive outcomes  Education material provided: Core 3  If problems or questions, patient to contact team via:  Phone and Email  Future DSME appointment: PRN

## 2019-05-13 ENCOUNTER — Encounter: Payer: Self-pay | Admitting: Occupational Therapy

## 2019-05-13 ENCOUNTER — Other Ambulatory Visit: Payer: Self-pay

## 2019-05-13 ENCOUNTER — Ambulatory Visit: Payer: Managed Care, Other (non HMO) | Admitting: Occupational Therapy

## 2019-05-13 DIAGNOSIS — R41842 Visuospatial deficit: Secondary | ICD-10-CM

## 2019-05-13 DIAGNOSIS — R278 Other lack of coordination: Secondary | ICD-10-CM

## 2019-05-13 DIAGNOSIS — M6281 Muscle weakness (generalized): Secondary | ICD-10-CM

## 2019-05-13 DIAGNOSIS — R41844 Frontal lobe and executive function deficit: Secondary | ICD-10-CM

## 2019-05-13 DIAGNOSIS — R41840 Attention and concentration deficit: Secondary | ICD-10-CM

## 2019-05-13 DIAGNOSIS — E119 Type 2 diabetes mellitus without complications: Secondary | ICD-10-CM | POA: Diagnosis not present

## 2019-05-13 NOTE — Therapy (Signed)
[facility] [address] [place], [place], [address] Phone: [phone]   Fax:  [phone]  Occupational Therapy Treatment  Patient Details  Name: Belinda Banks MRN: [id number] Date of Birth: [birth year] Referring Provider (OT): Dr. Erlinda Hong   Encounter Date: 05/13/2019  OT End of Session - 05/13/19 1022    Visit Number  8    Number of Visits  25    Date for OT Re-Evaluation  06/21/19    Authorization Type  [employer]    Authorization Time Period  60 days    Authorization - Visit Number  8    Authorization - Number of Visits  30    OT Start Time  1021    OT Stop Time  1100    OT Time Calculation (min)  39 min    Activity Tolerance  Patient tolerated treatment well    Behavior During Therapy  [facility] for tasks assessed/performed       Past Medical History:  Diagnosis Date  . Diabetes mellitus without complication ([place])    type 2  . Hypertension    controlled    Past Surgical History:  Procedure Laterality Date  . ABDOMINAL HYSTERECTOMY    . BACK SURGERY    . BUBBLE STUDY  03/14/2019   Procedure: BUBBLE STUDY;  Surgeon: [name], MD;  Location: [facility];  Service: Cardiovascular;;  . TEE WITHOUT CARDIOVERSION N/A 03/14/2019   Procedure: TRANSESOPHAGEAL ECHOCARDIOGRAM (TEE);  Surgeon: [name] [name], MD;  Location: [facility] ENDOSCOPY;  Service: Cardiovascular;  Laterality: N/A;    There were no vitals filed for this visit.  Subjective Assessment - 05/13/19 1021    Pertinent History  ICH 03/05/19    Limitations  fall risk, no driving    Patient Stated Goals  improve vision    Currently in Pain?  No/denies       Completing 48-piece puzzle with mod cueing and incr time for organized scanning pattern/use of strategies.  Reviewed/emphasized visual compensation strategies including organized scanning patterns, look for edges, look from side to side, work/search 1 section at a time, separate pieces, and use  organization during activities.  Educated/practiced these strategies while completing puzzle and then reviewed how using these strategies relates to ADLs/IADLs.  Constant therapy Symbol Matching for Symbols level 9 with 91% accuracy, 35.18sec average response time (23.29-66.02sec).        OT Short Term Goals - 05/11/19 0923      OT SHORT TERM GOAL #1   Title  Pt/ caregiver will be I with inital HEP.due 05/07/2019    Time  6    Period  Weeks    Status  Achieved    Target Date  05/07/19      OT SHORT TERM GOAL #2   Title  Pt./caregiver will verbalize understanding of visual compensation strategies    Time  6    Period  Weeks    Status  Achieved      OT SHORT TERM GOAL #3   Title  Pt will perform tabletop scanning activities with 75% accuracy and min v.c    Time  6    Period  Weeks    Status  Achieved      OT SHORT TERM GOAL #4   Title  Pt will demonstrate improved fine motor coordination for ADLS as evidenced by decreasing 9 hole peg test scaore to 50 secs or less.    Time  6    Period  Weeks    Status  Achieved   Rt = 26.09 sec, Lt = 21.88 sec     OT SHORT TERM GOAL #5   Title  Pt will navigate a  minimally distracting environment and locate items with 75% or better accuracy.    Time  6    Period  Weeks    Status  Achieved      OT SHORT TERM GOAL #6   Title  Pt will demonstrate ability to read a large print sentence with no more than min v.c    Time  6    Period  Weeks    Status  Achieved      OT SHORT TERM GOAL #7   Title  Pt will perform simple familiar home managment tasks with no more than min A.    Time  6    Period  Weeks    Status  Achieved   Pt reports doing laundry and simple cooking w/ supervision/min assist prn       OT Long Term Goals - 03/23/19 1735      OT LONG TERM GOAL #1   Title  Pt will be modified indpendent with all basic ADLs.-06/21/2019    Time  12    Period  Weeks    Status  New    Target Date  06/21/19      OT LONG TERM GOAL #2    Title  Pt will perform mod complex home management  with supervision, min v.c    Time  12    Period  Weeks    Status  New      OT LONG TERM GOAL #3   Title  Pt will perfrom basic cooking with min A    Time  12    Period  Weeks    Status  New      OT LONG TERM GOAL #4   Title  Pt will demonstrate ability to read a short large print paragraph with no more than min v.c    Time  12    Period  Weeks    Status  New      OT LONG TERM GOAL #5   Title  Pt will perform tabletop scanning activities with 90% or better accuracy.    Time  12    Period  Weeks    Status  New      OT LONG TERM GOAL #6   Title  Pt will perform environmental scanning with 90% or better accuracy in a mod distracting environment.    Time  12    Period  Weeks    Status  New            Plan - 05/13/19 1022    Clinical Impression Statement  Progressing towards goals with improving tabletop scanning, but needs cueing to scan all the way to each side and to use organized strategies/patterns.  Pt demo improved activity tolerance for visual activities today.    Occupational performance deficits (Please refer to evaluation for details):  ADL's;IADL's;Work;Leisure;Social Participation    Body Structure / Function / Physical Skills  ADL;UE functional use;Flexibility;Vision;FMC;Cardiopulmonary status limiting activity;Balance;Continence;Gait;ROM;GMC;Coordination;Decreased knowledge of precautions;IADL;Decreased knowledge of use of DME;Dexterity;Mobility;Strength    Rehab Potential  Good    OT Frequency  2x / week    OT Duration  12 weeks    OT Treatment/Interventions  Self-care/ADL training;Visual/perceptual remediation/compensation;Patient/family education;Aquatic Therapy;DME and/or AE instruction;Paraffin;Passive range of motion;Balance training;Cryotherapy;Fluidtherapy;Functional Mobility Training;Moist Heat;Therapeutic exercise;Manual Therapy;Therapeutic activities;Neuromuscular education;Cognitive  remediation/compensation    Plan  continue tabletop scanning/visual skills, coordination, functional tasks    Consulted and Agree with Plan of Care  Patient;Family member/caregiver    Family Member Consulted  husband       Patient will benefit from skilled therapeutic intervention in order to improve the following deficits and impairments:   Body Structure / Function / Physical Skills: ADL, UE functional use, Flexibility, Vision, FMC, Cardiopulmonary status limiting activity, Balance, Continence, Gait, ROM, GMC, Coordination, Decreased knowledge of precautions, IADL, Decreased knowledge of use of DME, Dexterity, Mobility, Strength       Visit Diagnosis: Visuospatial deficit  Other lack of coordination  Attention and concentration deficit  Frontal lobe and executive function deficit  Muscle weakness (generalized)    Problem List Patient Active Problem List   Diagnosis Date Noted  . CVA (cerebral vascular accident) ([place]) 03/11/2019  . Hypertensive emergency 03/09/2019  . Hyperlipidemia 03/09/2019  . DM (diabetes mellitus) ([place]) 03/07/2019  . HTN (hypertension) 03/07/2019  . Alcohol use 03/07/2019  . 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. [place], [place]  [address] [phone] phone [phone] 05/13/19 11:06 AM

## 2019-05-18 ENCOUNTER — Telehealth: Payer: Self-pay | Admitting: Neurology

## 2019-05-18 ENCOUNTER — Encounter: Payer: Self-pay | Admitting: Occupational Therapy

## 2019-05-18 ENCOUNTER — Ambulatory Visit: Payer: Managed Care, Other (non HMO) | Admitting: Speech Pathology

## 2019-05-18 ENCOUNTER — Ambulatory Visit: Payer: Managed Care, Other (non HMO) | Admitting: Occupational Therapy

## 2019-05-18 ENCOUNTER — Other Ambulatory Visit: Payer: Self-pay

## 2019-05-18 DIAGNOSIS — R41844 Frontal lobe and executive function deficit: Secondary | ICD-10-CM

## 2019-05-18 DIAGNOSIS — R41842 Visuospatial deficit: Secondary | ICD-10-CM

## 2019-05-18 DIAGNOSIS — M6281 Muscle weakness (generalized): Secondary | ICD-10-CM

## 2019-05-18 DIAGNOSIS — R41840 Attention and concentration deficit: Secondary | ICD-10-CM

## 2019-05-18 DIAGNOSIS — E119 Type 2 diabetes mellitus without complications: Secondary | ICD-10-CM | POA: Diagnosis not present

## 2019-05-18 DIAGNOSIS — R278 Other lack of coordination: Secondary | ICD-10-CM

## 2019-05-18 NOTE — Telephone Encounter (Signed)
Patient would like a call back regarding disability question. [phone]

## 2019-05-18 NOTE — Therapy (Signed)
[facility] [address] [place], [place], [address] Phone: [phone]   Fax:  [phone]  Occupational Therapy Treatment  Patient Details  Name: Belinda Banks MRN: [id number] Date of Birth: November 21, 1955 Referring Provider (OT): Dr. Erlinda Hong   Encounter Date: 05/18/2019  OT End of Session - 05/18/19 1056    Visit Number  9    Number of Visits  25    Date for OT Re-Evaluation  06/21/19    Authorization Type  [employer]    Authorization Time Period  60 days    Authorization - Visit Number  9    Authorization - Number of Visits  30    OT Start Time  1018    OT Stop Time  1058    OT Time Calculation (min)  40 min    Activity Tolerance  Patient tolerated treatment well    Behavior During Therapy  [facility] for tasks assessed/performed       Past Medical History:  Diagnosis Date  . Diabetes mellitus without complication ([place])    type 2  . Hypertension    controlled    Past Surgical History:  Procedure Laterality Date  . ABDOMINAL HYSTERECTOMY    . BACK SURGERY    . BUBBLE STUDY  03/14/2019   Procedure: BUBBLE STUDY;  Surgeon: [name], MD;  Location: [facility];  Service: Cardiovascular;;  . TEE WITHOUT CARDIOVERSION N/A 03/14/2019   Procedure: TRANSESOPHAGEAL ECHOCARDIOGRAM (TEE);  Surgeon: [name] [name], MD;  Location[facility] ENDOSCOPY;  Service: Cardiovascular;  Laterality: N/A;    There were no vitals filed for this visit.  Subjective Assessment - 05/18/19 1054    Subjective   Pt reports cooking a chicken pie and that it went well last night    Pertinent History  ICH 03/05/19    Limitations  fall risk, no driving    Patient Stated Goals  improve vision    Currently in Pain?  No/denies           Treatment: Pt corrected errors from a previously performed schedule reading task, that pt made moderate errors. Pt required min v.c for thoroughness and organized scan pattern.  Pt. performed a new schedule  reading task to answer questions with only 1 error, and min v.c for problem solving. Alternating pattern activity on constant therapy 97% when performing level 1, 87% when performing level 5 for increased scanning. Copying small peg design using left and RUE for increased fine motor coordination and visual perceptual skills, pt performed correctly with out errors.                  OT Short Term Goals - 05/11/19 0923      OT SHORT TERM GOAL #1   Title  Pt/ caregiver will be I with inital HEP.due 05/07/2019    Time  6    Period  Weeks    Status  Achieved    Target Date  05/07/19      OT SHORT TERM GOAL #2   Title  Pt./caregiver will verbalize understanding of visual compensation strategies    Time  6    Period  Weeks    Status  Achieved      OT SHORT TERM GOAL #3   Title  Pt will perform tabletop scanning activities with 75% accuracy and min v.c    Time  6    Period  Weeks    Status  Achieved  OT SHORT TERM GOAL #4   Title  Pt will demonstrate improved fine motor coordination for ADLS as evidenced by decreasing 9 hole peg test scaore to 50 secs or less.    Time  6    Period  Weeks    Status  Achieved   Rt = 26.09 sec, Lt = 21.88 sec     OT SHORT TERM GOAL #5   Title  Pt will navigate a  minimally distracting environment and locate items with 75% or better accuracy.    Time  6    Period  Weeks    Status  Achieved      OT SHORT TERM GOAL #6   Title  Pt will demonstrate ability to read a large print sentence with no more than min v.c    Time  6    Period  Weeks    Status  Achieved      OT SHORT TERM GOAL #7   Title  Pt will perform simple familiar home managment tasks with no more than min A.    Time  6    Period  Weeks    Status  Achieved   Pt reports doing laundry and simple cooking w/ supervision/min assist prn       OT Long Term Goals - 03/23/19 1735      OT LONG TERM GOAL #1   Title  Pt will be modified indpendent with all basic  ADLs.-06/21/2019    Time  12    Period  Weeks    Status  New    Target Date  06/21/19      OT LONG TERM GOAL #2   Title  Pt will perform mod complex home management  with supervision, min v.c    Time  12    Period  Weeks    Status  New      OT LONG TERM GOAL #3   Title  Pt will perfrom basic cooking with min A    Time  12    Period  Weeks    Status  New      OT LONG TERM GOAL #4   Title  Pt will demonstrate ability to read a short large print paragraph with no more than min v.c    Time  12    Period  Weeks    Status  New      OT LONG TERM GOAL #5   Title  Pt will perform tabletop scanning activities with 90% or better accuracy.    Time  12    Period  Weeks    Status  New      OT LONG TERM GOAL #6   Title  Pt will perform environmental scanning with 90% or better accuracy in a mod distracting environment.    Time  12    Period  Weeks    Status  New            Plan - 05/18/19 1056    Clinical Impression Statement  Pt is progressing towards goals. She demonstrates improving scanning but she continues to need cues to scan completely when looking for specific information.    Occupational performance deficits (Please refer to evaluation for details):  ADL's;IADL's;Work;Leisure;Social Participation    Body Structure / Function / Physical Skills  ADL;UE functional use;Flexibility;Vision;FMC;Cardiopulmonary status limiting activity;Balance;Continence;Gait;ROM;GMC;Coordination;Decreased knowledge of precautions;IADL;Decreased knowledge of use of DME;Dexterity;Mobility;Strength    Rehab Potential  Good    OT Frequency  2x / week  OT Duration  12 weeks    OT Treatment/Interventions  Self-care/ADL training;Visual/perceptual remediation/compensation;Patient/family education;Aquatic Therapy;DME and/or AE instruction;Paraffin;Passive range of motion;Balance training;Cryotherapy;Fluidtherapy;Functional Mobility Training;Moist Heat;Therapeutic exercise;Manual Therapy;Therapeutic  activities;Neuromuscular education;Cognitive remediation/compensation    Plan  continue tabletop scanning/visual skills, coordination, functional tasks    Consulted and Agree with Plan of Care  Patient;Family member/caregiver    Family Member Consulted  husband       Patient will benefit from skilled therapeutic intervention in order to improve the following deficits and impairments:   Body Structure / Function / Physical Skills: ADL, UE functional use, Flexibility, Vision, FMC, Cardiopulmonary status limiting activity, Balance, Continence, Gait, ROM, GMC, Coordination, Decreased knowledge of precautions, IADL, Decreased knowledge of use of DME, Dexterity, Mobility, Strength       Visit Diagnosis: Visuospatial deficit  Attention and concentration deficit  Frontal lobe and executive function deficit  Muscle weakness (generalized)  Other lack of coordination    Problem List Patient Active Problem List   Diagnosis Date Noted  . CVA (cerebral vascular accident) ([place]) 03/11/2019  . Hypertensive emergency 03/09/2019  . Hyperlipidemia 03/09/2019  . DM (diabetes mellitus) ([place]) 03/07/2019  . HTN (hypertension) 03/07/2019  . Alcohol use 03/07/2019  . ICH (intracerebral hemorrhage) ([place]) - Hypertensive L Basal Ganglia ICH 03/05/2019    Ercole Georg 05/18/2019, 10:58 AM  [facility] [address] [place], [place], [address] Phone: [phone]   Fax:  [phone]  Name: NETHRA FRANCIONE MRN: [id number] Date of Birth: 04-28-1955

## 2019-05-19 NOTE — Telephone Encounter (Signed)
I called pt and she will have [place]  form sent to [place] from [employer] due to needing extension of this from 06-20-19, as her neuro-opthamology appt is not scheduled until 08-16-19.  I do not see that we did Disability form on her, but she said she got copy from MR yesterday when she was here.

## 2019-05-20 ENCOUNTER — Ambulatory Visit: Payer: Managed Care, Other (non HMO) | Admitting: Occupational Therapy

## 2019-05-20 ENCOUNTER — Other Ambulatory Visit: Payer: Self-pay

## 2019-05-20 DIAGNOSIS — R41842 Visuospatial deficit: Secondary | ICD-10-CM

## 2019-05-20 DIAGNOSIS — R41840 Attention and concentration deficit: Secondary | ICD-10-CM

## 2019-05-20 DIAGNOSIS — E119 Type 2 diabetes mellitus without complications: Secondary | ICD-10-CM | POA: Diagnosis not present

## 2019-05-20 DIAGNOSIS — R41844 Frontal lobe and executive function deficit: Secondary | ICD-10-CM

## 2019-05-20 NOTE — Therapy (Signed)
[facility] [address] [place], [place], [address] Phone: [phone]   Fax:  [phone]  Occupational Therapy Treatment  Patient Details  Name: Belinda Banks MRN: [id number] Date of Birth: Jun 30, 1955 Referring Provider (OT): Dr. Erlinda Hong   Encounter Date: 05/20/2019  OT End of Session - 05/20/19 1044    Visit Number  10    Number of Visits  25    Date for OT Re-Evaluation  06/21/19    Authorization Type  [employer]    Authorization Time Period  60 days    Authorization - Visit Number  10    Authorization - Number of Visits  30    OT Start Time  1017    OT Stop Time  1057    OT Time Calculation (min)  40 min    Activity Tolerance  Patient tolerated treatment well    Behavior During Therapy  [facility] for tasks assessed/performed       Past Medical History:  Diagnosis Date  . Diabetes mellitus without complication ([place])    type 2  . Hypertension    controlled    Past Surgical History:  Procedure Laterality Date  . ABDOMINAL HYSTERECTOMY    . BACK SURGERY    . BUBBLE STUDY  03/14/2019   Procedure: BUBBLE STUDY;  Surgeon: [name], MD;  Location: [facility];  Service: Cardiovascular;;  . TEE WITHOUT CARDIOVERSION N/A 03/14/2019   Procedure: TRANSESOPHAGEAL ECHOCARDIOGRAM (TEE);  Surgeon: [name] [name], MD;  Location: [facility] ENDOSCOPY;  Service: Cardiovascular;  Laterality: N/A;    There were no vitals filed for this visit.        Treatment:environmental scanning in sequential order 4/10 items missed first pass, min v.c to locate remaining items and reinforced organized scanning pattern Constant therapy alternating pattern 91% accuracy, min v.c for increased scanning/ alternating attention Mod complex puzzle, min-mod v.c to initiate task, pt did not complete due to time constraints.                 OT Short Term Goals - 05/11/19 0923      OT SHORT TERM GOAL #1   Title  Pt/ caregiver will  be I with inital HEP.due 05/07/2019    Time  6    Period  Weeks    Status  Achieved    Target Date  05/07/19      OT SHORT TERM GOAL #2   Title  Pt./caregiver will verbalize understanding of visual compensation strategies    Time  6    Period  Weeks    Status  Achieved      OT SHORT TERM GOAL #3   Title  Pt will perform tabletop scanning activities with 75% accuracy and min v.c    Time  6    Period  Weeks    Status  Achieved      OT SHORT TERM GOAL #4   Title  Pt will demonstrate improved fine motor coordination for ADLS as evidenced by decreasing 9 hole peg test scaore to 50 secs or less.    Time  6    Period  Weeks    Status  Achieved   Rt = 26.09 sec, Lt = 21.88 sec     OT SHORT TERM GOAL #5   Title  Pt will navigate a  minimally distracting environment and locate items with 75% or better accuracy.    Time  6    Period  Weeks  Status  Achieved      OT SHORT TERM GOAL #6   Title  Pt will demonstrate ability to read a large print sentence with no more than min v.c    Time  6    Period  Weeks    Status  Achieved      OT SHORT TERM GOAL #7   Title  Pt will perform simple familiar home managment tasks with no more than min A.    Time  6    Period  Weeks    Status  Achieved   Pt reports doing laundry and simple cooking w/ supervision/min assist prn       OT Long Term Goals - 03/23/19 1735      OT LONG TERM GOAL #1   Title  Pt will be modified indpendent with all basic ADLs.-06/21/2019    Time  12    Period  Weeks    Status  New    Target Date  06/21/19      OT LONG TERM GOAL #2   Title  Pt will perform mod complex home management  with supervision, min v.c    Time  12    Period  Weeks    Status  New      OT LONG TERM GOAL #3   Title  Pt will perfrom basic cooking with min A    Time  12    Period  Weeks    Status  New      OT LONG TERM GOAL #4   Title  Pt will demonstrate ability to read a short large print paragraph with no more than min v.c    Time   12    Period  Weeks    Status  New      OT LONG TERM GOAL #5   Title  Pt will perform tabletop scanning activities with 90% or better accuracy.    Time  12    Period  Weeks    Status  New      OT LONG TERM GOAL #6   Title  Pt will perform environmental scanning with 90% or better accuracy in a mod distracting environment.    Time  12    Period  Weeks    Status  New            Plan - 05/20/19 1206    Clinical Impression Statement  Pt states her vision is worse today. Pt continues to report patches of missing vision. Pt repors blood sugar was elevated today and vision difficulties may be related to this.    Occupational performance deficits (Please refer to evaluation for details):  ADL's;IADL's;Work;Leisure;Social Participation    Body Structure / Function / Physical Skills  ADL;UE functional use;Flexibility;Vision;FMC;Cardiopulmonary status limiting activity;Balance;Continence;Gait;ROM;GMC;Coordination;Decreased knowledge of precautions;IADL;Decreased knowledge of use of DME;Dexterity;Mobility;Strength    Rehab Potential  Good    OT Frequency  2x / week    OT Duration  12 weeks    OT Treatment/Interventions  Self-care/ADL training;Visual/perceptual remediation/compensation;Patient/family education;Aquatic Therapy;DME and/or AE instruction;Paraffin;Passive range of motion;Balance training;Cryotherapy;Fluidtherapy;Functional Mobility Training;Moist Heat;Therapeutic exercise;Manual Therapy;Therapeutic activities;Neuromuscular education;Cognitive remediation/compensation    Plan  continue tabletop scanning/visual skills, coordination, functional tasks    Consulted and Agree with Plan of Care  Patient;Family member/caregiver    Family Member Consulted  husband       Patient will benefit from skilled therapeutic intervention in order to improve the following deficits and impairments:   Body Structure / Function / Physical  Skills: ADL, UE functional use, Flexibility, Vision, FMC,  Cardiopulmonary status limiting activity, Balance, Continence, Gait, ROM, GMC, Coordination, Decreased knowledge of precautions, IADL, Decreased knowledge of use of DME, Dexterity, Mobility, Strength       Visit Diagnosis: Visuospatial deficit  Frontal lobe and executive function deficit  Attention and concentration deficit    Problem List Patient Active Problem List   Diagnosis Date Noted  . CVA (cerebral vascular accident) ([place]) 03/11/2019  . Hypertensive emergency 03/09/2019  . Hyperlipidemia 03/09/2019  . DM (diabetes mellitus) ([place]) 03/07/2019  . HTN (hypertension) 03/07/2019  . Alcohol use 03/07/2019  . ICH (intracerebral hemorrhage) ([place]) - Hypertensive L Basal Ganglia ICH 03/05/2019    Lyssa Hackley 05/20/2019, 12:13 PM  [facility] [address] [place], [place], [address] Phone: [phone]   Fax:  [phone]  Name: Belinda Banks MRN: [id number] Date of Birth: 02-21-1956

## 2019-05-24 ENCOUNTER — Ambulatory Visit: Payer: Managed Care, Other (non HMO) | Attending: Neurology | Admitting: Occupational Therapy

## 2019-05-24 ENCOUNTER — Other Ambulatory Visit: Payer: Self-pay

## 2019-05-24 DIAGNOSIS — R41842 Visuospatial deficit: Secondary | ICD-10-CM | POA: Diagnosis not present

## 2019-05-24 DIAGNOSIS — R278 Other lack of coordination: Secondary | ICD-10-CM | POA: Diagnosis present

## 2019-05-24 DIAGNOSIS — R41844 Frontal lobe and executive function deficit: Secondary | ICD-10-CM | POA: Diagnosis present

## 2019-05-24 DIAGNOSIS — R41840 Attention and concentration deficit: Secondary | ICD-10-CM | POA: Insufficient documentation

## 2019-05-24 NOTE — Therapy (Signed)
[facility] [address] [place], [place], [address] Phone: [phone]   Fax:  [phone]  Occupational Therapy Treatment  Patient Details  Name: ADDILYNE Banks MRN: [id number] Date of Birth: [birth year] Referring Provider (OT): Dr. Erlinda Hong   Encounter Date: 05/24/2019  OT End of Session - 05/24/19 1037    Visit Number  11    Number of Visits  25    Date for OT Re-Evaluation  06/21/19    Authorization Type  [employer]    Authorization Time Period  60 days    Authorization - Visit Number  11    Authorization - Number of Visits  30    OT Start Time  [id number]    OT Stop Time  1100    OT Time Calculation (min)  45 min    Activity Tolerance  Patient tolerated treatment well    Behavior During Therapy  [facility] for tasks assessed/performed       Past Medical History:  Diagnosis Date  . Diabetes mellitus without complication ([place])    type 2  . Hypertension    controlled    Past Surgical History:  Procedure Laterality Date  . ABDOMINAL HYSTERECTOMY    . BACK SURGERY    . BUBBLE STUDY  03/14/2019   Procedure: BUBBLE STUDY;  Surgeon: [name], MD;  Location: [facility];  Service: Cardiovascular;;  . TEE WITHOUT CARDIOVERSION N/A 03/14/2019   Procedure: TRANSESOPHAGEAL ECHOCARDIOGRAM (TEE);  Surgeon: [name] [name], MD;  Location: [facility] ENDOSCOPY;  Service: Cardiovascular;  Laterality: N/A;    There were no vitals filed for this visit.  Subjective Assessment - 05/24/19 1019    Patient is accompanied by:  Family member   HUSBAND   Pertinent History  ICH 03/05/19    Limitations  fall risk, no driving    Patient Stated Goals  improve vision    Currently in Pain?  No/denies                   OT Treatments/Exercises (OP) - 05/24/19 0001      Visual/Perceptual Exercises   Other Exercises  Pt assembling 48 pc puzzle with max cues t/o task to develop/use strategies (finding corner and edge pcs, matching colors,  shapes and space according to picture, orienting pc properly, etc) w/ extra time required to complete. Pt took entire session to complete w/ some frustration noted about 1/2 way through. Suggested working on 24 pc puzzles I'ly at home and 48 pc puzzles w/ some help and 1 rest break at home.                OT Short Term Goals - 05/11/19 0923      OT SHORT TERM GOAL #1   Title  Pt/ caregiver will be I with inital HEP.due 05/07/2019    Time  6    Period  Weeks    Status  Achieved    Target Date  05/07/19      OT SHORT TERM GOAL #2   Title  Pt./caregiver will verbalize understanding of visual compensation strategies    Time  6    Period  Weeks    Status  Achieved      OT SHORT TERM GOAL #3   Title  Pt will perform tabletop scanning activities with 75% accuracy and min v.c    Time  6    Period  Weeks    Status  Achieved  OT SHORT TERM GOAL #4   Title  Pt will demonstrate improved fine motor coordination for ADLS as evidenced by decreasing 9 hole peg test scaore to 50 secs or less.    Time  6    Period  Weeks    Status  Achieved   Rt = 26.09 sec, Lt = 21.88 sec     OT SHORT TERM GOAL #5   Title  Pt will navigate a  minimally distracting environment and locate items with 75% or better accuracy.    Time  6    Period  Weeks    Status  Achieved      OT SHORT TERM GOAL #6   Title  Pt will demonstrate ability to read a large print sentence with no more than min v.c    Time  6    Period  Weeks    Status  Achieved      OT SHORT TERM GOAL #7   Title  Pt will perform simple familiar home managment tasks with no more than min A.    Time  6    Period  Weeks    Status  Achieved   Pt reports doing laundry and simple cooking w/ supervision/min assist prn       OT Long Term Goals - 03/23/19 1735      OT LONG TERM GOAL #1   Title  Pt will be modified indpendent with all basic ADLs.-06/21/2019    Time  12    Period  Weeks    Status  New    Target Date  06/21/19      OT  LONG TERM GOAL #2   Title  Pt will perform mod complex home management  with supervision, min v.c    Time  12    Period  Weeks    Status  New      OT LONG TERM GOAL #3   Title  Pt will perfrom basic cooking with min A    Time  12    Period  Weeks    Status  New      OT LONG TERM GOAL #4   Title  Pt will demonstrate ability to read a short large print paragraph with no more than min v.c    Time  12    Period  Weeks    Status  New      OT LONG TERM GOAL #5   Title  Pt will perform tabletop scanning activities with 90% or better accuracy.    Time  12    Period  Weeks    Status  New      OT LONG TERM GOAL #6   Title  Pt will perform environmental scanning with 90% or better accuracy in a mod distracting environment.    Time  12    Period  Weeks    Status  New            Plan - 05/24/19 1111    Clinical Impression Statement  Pt gradually progressing with sustained visual/cognitive tasks - pt able to maintain focus on visual task today for 45 min. w/ only 1 short rest break    Occupational performance deficits (Please refer to evaluation for details):  ADL's;IADL's;Work;Leisure;Social Participation    Body Structure / Function / Physical Skills  ADL;UE functional use;Flexibility;Vision;FMC;Cardiopulmonary status limiting activity;Balance;Continence;Gait;ROM;GMC;Coordination;Decreased knowledge of precautions;IADL;Decreased knowledge of use of DME;Dexterity;Mobility;Strength    Rehab Potential  Good    OT Frequency  2x / week    OT Duration  12 weeks    OT Treatment/Interventions  Self-care/ADL training;Visual/perceptual remediation/compensation;Patient/family education;Aquatic Therapy;DME and/or AE instruction;Paraffin;Passive range of motion;Balance training;Cryotherapy;Fluidtherapy;Functional Mobility Training;Moist Heat;Therapeutic exercise;Manual Therapy;Therapeutic activities;Neuromuscular education;Cognitive remediation/compensation    Plan  environmental scanning,  copying peg design, functional tasks    Consulted and Agree with Plan of Care  Patient;Family member/caregiver    Family Member Consulted  husband       Patient will benefit from skilled therapeutic intervention in order to improve the following deficits and impairments:   Body Structure / Function / Physical Skills: ADL, UE functional use, Flexibility, Vision, FMC, Cardiopulmonary status limiting activity, Balance, Continence, Gait, ROM, GMC, Coordination, Decreased knowledge of precautions, IADL, Decreased knowledge of use of DME, Dexterity, Mobility, Strength       Visit Diagnosis: Visuospatial deficit  Attention and concentration deficit  Other lack of coordination    Problem List Patient Active Problem List   Diagnosis Date Noted  . CVA (cerebral vascular accident) ([place]) 03/11/2019  . Hypertensive emergency 03/09/2019  . Hyperlipidemia 03/09/2019  . DM (diabetes mellitus) ([place]) 03/07/2019  . HTN (hypertension) 03/07/2019  . Alcohol use 03/07/2019  . ICH (intracerebral hemorrhage) ([place]) - Hypertensive L Basal Ganglia ICH 03/05/2019    Carey Bullocks, OTR/L 05/24/2019, [facility] [address] [place], [place], [address] Phone: [phone]   Fax:  [phone]  Name: Belinda Banks MRN: [id number] Date of Birth: 1955-05-23

## 2019-05-24 NOTE — Telephone Encounter (Signed)
I have not received any tiype of form for pt.  I called her and she has a call into to [place] at [facility].  I LMVM for her as well if needed any additional p/w from [place].  We see her next 07-14-19.

## 2019-05-25 NOTE — Telephone Encounter (Signed)
I spoke to Belinda Banks with pts [employer] leave specialist.  I relayed that we have see pt once.  She has appt with Neuro- opthamologist in 08-16-19 Dr. Sanda Klein is on cancellation list.   We see her back  07-14-19 for RV.  Belinda Banks, asking for notes from PT and information will be addressed with her group once received.  Nothing for [place] to do on our part at this time.

## 2019-05-26 ENCOUNTER — Other Ambulatory Visit: Payer: Self-pay

## 2019-05-26 ENCOUNTER — Ambulatory Visit: Payer: Managed Care, Other (non HMO) | Admitting: Occupational Therapy

## 2019-05-26 DIAGNOSIS — R41842 Visuospatial deficit: Secondary | ICD-10-CM

## 2019-05-26 DIAGNOSIS — R41840 Attention and concentration deficit: Secondary | ICD-10-CM

## 2019-05-26 DIAGNOSIS — R278 Other lack of coordination: Secondary | ICD-10-CM

## 2019-05-26 NOTE — Therapy (Signed)
[facility] [address] [place], [place], [address] Phone: [phone]   Fax:  [phone]  Occupational Therapy Treatment  Patient Details  Name: Belinda Banks MRN: [id number] Date of Birth: [birth year] Referring Provider (OT): Dr. Erlinda Hong   Encounter Date: 05/26/2019  OT End of Session - 05/26/19 1200    Visit Number  12    Number of Visits  25    Date for OT Re-Evaluation  06/21/19    Authorization Type  [employer]    Authorization Time Period  60 days    Authorization - Visit Number  12    Authorization - Number of Visits  30    OT Start Time  1150    OT Stop Time  1230    OT Time Calculation (min)  40 min    Activity Tolerance  Patient tolerated treatment well    Behavior During Therapy  [facility] for tasks assessed/performed       Past Medical History:  Diagnosis Date  . Diabetes mellitus without complication ([place])    type 2  . Hypertension    controlled    Past Surgical History:  Procedure Laterality Date  . ABDOMINAL HYSTERECTOMY    . BACK SURGERY    . BUBBLE STUDY  03/14/2019   Procedure: BUBBLE STUDY;  Surgeon: [name], MD;  Location: [facility];  Service: Cardiovascular;;  . TEE WITHOUT CARDIOVERSION N/A 03/14/2019   Procedure: TRANSESOPHAGEAL ECHOCARDIOGRAM (TEE);  Surgeon: [name] [name], MD;  Location: [facility] ENDOSCOPY;  Service: Cardiovascular;  Laterality: N/A;    There were no vitals filed for this visit.  Subjective Assessment - 05/26/19 1153    Subjective   We got 5 new puzzles    Patient is accompanied by:  Family member    Pertinent History  ICH 03/05/19    Limitations  fall risk, no driving    Patient Stated Goals  improve vision    Currently in Pain?  No/denies                   OT Treatments/Exercises (OP) - 05/26/19 0001      Visual/Perceptual Exercises   Copy this Image  PVC    PVC  Pt copied complex peg design w/ Rt hand for coordination, visual scanning and  visual/perceptual skills w/ 100% accuracy and mod dififculty using Rt hand    Scanning  Environmental    Scanning - Environmental  Pt found 11/13 items on first pass (85% accuracy) w/ extra time and several stops required in minimal distracting environment. Pt found remaining 2 items on 2nd pass    Other Exercises  Pt issued handouts for connect the dots, mazes, and varying levels of word searches to complete over next 2 weeks. Placed in order of simple to complex for pt to follow. Also recommended functional visual tasks/scanning including looking for items in grocery store and how to progressively make more challenging. Also recommended visual scanning/cognitive games like card games, board games, computer games.                OT Short Term Goals - 05/11/19 0923      OT SHORT TERM GOAL #1   Title  Pt/ caregiver will be I with inital HEP.due 05/07/2019    Time  6    Period  Weeks    Status  Achieved    Target Date  05/07/19      OT SHORT TERM GOAL #2  Title  Pt./caregiver will verbalize understanding of visual compensation strategies    Time  6    Period  Weeks    Status  Achieved      OT SHORT TERM GOAL #3   Title  Pt will perform tabletop scanning activities with 75% accuracy and min v.c    Time  6    Period  Weeks    Status  Achieved      OT SHORT TERM GOAL #4   Title  Pt will demonstrate improved fine motor coordination for ADLS as evidenced by decreasing 9 hole peg test scaore to 50 secs or less.    Time  6    Period  Weeks    Status  Achieved   Rt = 26.09 sec, Lt = 21.88 sec     OT SHORT TERM GOAL #5   Title  Pt will navigate a  minimally distracting environment and locate items with 75% or better accuracy.    Time  6    Period  Weeks    Status  Achieved      OT SHORT TERM GOAL #6   Title  Pt will demonstrate ability to read a large print sentence with no more than min v.c    Time  6    Period  Weeks    Status  Achieved      OT SHORT TERM GOAL #7    Title  Pt will perform simple familiar home managment tasks with no more than min A.    Time  6    Period  Weeks    Status  Achieved   Pt reports doing laundry and simple cooking w/ supervision/min assist prn       OT Long Term Goals - 03/23/19 1735      OT LONG TERM GOAL #1   Title  Pt will be modified indpendent with all basic ADLs.-06/21/2019    Time  12    Period  Weeks    Status  New    Target Date  06/21/19      OT LONG TERM GOAL #2   Title  Pt will perform mod complex home management  with supervision, min v.c    Time  12    Period  Weeks    Status  New      OT LONG TERM GOAL #3   Title  Pt will perfrom basic cooking with min A    Time  12    Period  Weeks    Status  New      OT LONG TERM GOAL #4   Title  Pt will demonstrate ability to read a short large print paragraph with no more than min v.c    Time  12    Period  Weeks    Status  New      OT LONG TERM GOAL #5   Title  Pt will perform tabletop scanning activities with 90% or better accuracy.    Time  12    Period  Weeks    Status  New      OT LONG TERM GOAL #6   Title  Pt will perform environmental scanning with 90% or better accuracy in a mod distracting environment.    Time  12    Period  Weeks    Status  New            Plan - 05/26/19 1346    Clinical Impression Statement  Pt making  gradual progress towards goals.    Occupational performance deficits (Please refer to evaluation for details):  ADL's;IADL's;Work;Leisure;Social Participation    Body Structure / Function / Physical Skills  ADL;UE functional use;Flexibility;Vision;FMC;Cardiopulmonary status limiting activity;Balance;Continence;Gait;ROM;GMC;Coordination;Decreased knowledge of precautions;IADL;Decreased knowledge of use of DME;Dexterity;Mobility;Strength    Rehab Potential  Good    OT Frequency  2x / week    OT Duration  12 weeks    OT Treatment/Interventions  Self-care/ADL training;Visual/perceptual  remediation/compensation;Patient/family education;Aquatic Therapy;DME and/or AE instruction;Paraffin;Passive range of motion;Balance training;Cryotherapy;Fluidtherapy;Functional Mobility Training;Moist Heat;Therapeutic exercise;Manual Therapy;Therapeutic activities;Neuromuscular education;Cognitive remediation/compensation    Plan  practice reading, simple cooking tasks, constant therapy    Consulted and Agree with Plan of Care  Patient;Family member/caregiver    Family Member Consulted  husband       Patient will benefit from skilled therapeutic intervention in order to improve the following deficits and impairments:   Body Structure / Function / Physical Skills: ADL, UE functional use, Flexibility, Vision, FMC, Cardiopulmonary status limiting activity, Balance, Continence, Gait, ROM, GMC, Coordination, Decreased knowledge of precautions, IADL, Decreased knowledge of use of DME, Dexterity, Mobility, Strength       Visit Diagnosis: Visuospatial deficit  Attention and concentration deficit  Other lack of coordination    Problem List Patient Active Problem List   Diagnosis Date Noted  . CVA (cerebral vascular accident) ([place]) 03/11/2019  . Hypertensive emergency 03/09/2019  . Hyperlipidemia 03/09/2019  . DM (diabetes mellitus) ([place]) 03/07/2019  . HTN (hypertension) 03/07/2019  . Alcohol use 03/07/2019  . ICH (intracerebral hemorrhage) ([place]) - Hypertensive L Basal Ganglia ICH 03/05/2019    Carey Bullocks, OTR/L 05/26/2019, 1:47 PM  [facility] [address] [place], [place], [address] Phone: [phone]   Fax:  [phone]  Name: BLONDELL MESKER MRN: [id number] Date of Birth: 11-29-1955

## 2019-05-31 ENCOUNTER — Telehealth: Payer: Self-pay | Admitting: Adult Health

## 2019-05-31 ENCOUNTER — Ambulatory Visit: Payer: Managed Care, Other (non HMO) | Admitting: Occupational Therapy

## 2019-05-31 ENCOUNTER — Other Ambulatory Visit: Payer: Self-pay

## 2019-05-31 NOTE — Telephone Encounter (Signed)
I called pt and she states that she feels that her vision has decreased since the last 3-5 days.  Total vision.  She has no other sx.  She is still awaiting on her neuro opthamology appt with Sanda Klein, MD that is 08-16-19.  I told her to call and see if can move up appt.  She has not seen eye doc here locally.  She is asking for sooner appt with [place].  Please advise.

## 2019-05-31 NOTE — Telephone Encounter (Signed)
Pt states she has an upcoming appointment on 03/25 and would like to see if she can be seen sooner due to worsening symptoms/vision problems. Please advise.

## 2019-05-31 NOTE — Therapy (Signed)
[facility] [address] [place], [place], [address] Phone: [phone]   Fax:  [phone]  Occupational Therapy Treatment  Patient Details  Name: Belinda Banks MRN: [id number] Date of Birth: 1955-12-27 Referring Provider (OT): Dr. Erlinda Hong   Encounter Date: 05/31/2019  OT End of Session - 05/31/19 1040    Visit Number  12    Number of Visits  25    OT Start Time  1015   no charge   OT Stop Time  1030    OT Time Calculation (min)  15 min    Activity Tolerance  Patient tolerated treatment well       Past Medical History:  Diagnosis Date  . Diabetes mellitus without complication ([place])    type 2  . Hypertension    controlled    Past Surgical History:  Procedure Laterality Date  . ABDOMINAL HYSTERECTOMY    . BACK SURGERY    . BUBBLE STUDY  03/14/2019   Procedure: BUBBLE STUDY;  Surgeon: [name], MD;  Location: [facility];  Service: Cardiovascular;;  . TEE WITHOUT CARDIOVERSION N/A 03/14/2019   Procedure: TRANSESOPHAGEAL ECHOCARDIOGRAM (TEE);  Surgeon: [name] [name], MD;  Location: [facility] ENDOSCOPY;  Service: Cardiovascular;  Laterality: N/A;    There were no vitals filed for this visit.  Subjective Assessment - 05/31/19 1020    Subjective   My eyes have been more messed up since later last week. I'm going to see if Dr. Erlinda Hong can see me sooner.    Patient is accompanied by:  Family member   husband   Pertinent History  ICH 03/05/19    Limitations  fall risk, no driving    Patient Stated Goals  improve vision    Currently in Pain?  No/denies       Pt arrived but complained of worsening vision and unable to participate in activities. Denies other symptoms. Husband reports vitals have been normal.  Pt to talk w/ MD about symptoms.  Pt not charged for this visit                      OT Short Term Goals - 05/11/19 0923      OT SHORT TERM GOAL #1   Title  Pt/ caregiver will be I with  inital HEP.due 05/07/2019    Time  6    Period  Weeks    Status  Achieved    Target Date  05/07/19      OT SHORT TERM GOAL #2   Title  Pt./caregiver will verbalize understanding of visual compensation strategies    Time  6    Period  Weeks    Status  Achieved      OT SHORT TERM GOAL #3   Title  Pt will perform tabletop scanning activities with 75% accuracy and min v.c    Time  6    Period  Weeks    Status  Achieved      OT SHORT TERM GOAL #4   Title  Pt will demonstrate improved fine motor coordination for ADLS as evidenced by decreasing 9 hole peg test scaore to 50 secs or less.    Time  6    Period  Weeks    Status  Achieved   Rt = 26.09 sec, Lt = 21.88 sec     OT SHORT TERM GOAL #5   Title  Pt will navigate a  minimally distracting environment and locate  items with 75% or better accuracy.    Time  6    Period  Weeks    Status  Achieved      OT SHORT TERM GOAL #6   Title  Pt will demonstrate ability to read a large print sentence with no more than min v.c    Time  6    Period  Weeks    Status  Achieved      OT SHORT TERM GOAL #7   Title  Pt will perform simple familiar home managment tasks with no more than min A.    Time  6    Period  Weeks    Status  Achieved   Pt reports doing laundry and simple cooking w/ supervision/min assist prn       OT Long Term Goals - 03/23/19 1735      OT LONG TERM GOAL #1   Title  Pt will be modified indpendent with all basic ADLs.-06/21/2019    Time  12    Period  Weeks    Status  New    Target Date  06/21/19      OT LONG TERM GOAL #2   Title  Pt will perform mod complex home management  with supervision, min v.c    Time  12    Period  Weeks    Status  New      OT LONG TERM GOAL #3   Title  Pt will perfrom basic cooking with min A    Time  12    Period  Weeks    Status  New      OT LONG TERM GOAL #4   Title  Pt will demonstrate ability to read a short large print paragraph with no more than min v.c    Time  12     Period  Weeks    Status  New      OT LONG TERM GOAL #5   Title  Pt will perform tabletop scanning activities with 90% or better accuracy.    Time  12    Period  Weeks    Status  New      OT LONG TERM GOAL #6   Title  Pt will perform environmental scanning with 90% or better accuracy in a mod distracting environment.    Time  12    Period  Weeks    Status  New            Plan - 05/31/19 1043    Clinical Impression Statement  Pt w/ recent worsening vision since mid to late last week - pt unable to participate in activities today. Pt/husband report BP and blood sugar were normal this morning and have been normal. No other signs/symptoms except worse vision. Pt to stop by neurologist office on her way out    OT Frequency  2x / week    OT Duration  12 weeks    Plan  practice reading, simple cooking tasks, constant therapy if able, however pt may cancel next appointment if vision still worse    Consulted and Agree with Plan of Care  Patient;Family member/caregiver    Family Member Consulted  husband       Patient will benefit from skilled therapeutic intervention in order to improve the following deficits and impairments:           Visit Diagnosis: Visuospatial deficit    Problem List Patient Active Problem List   Diagnosis Date Noted  .  CVA (cerebral vascular accident) ([place]) 03/11/2019  . Hypertensive emergency 03/09/2019  . Hyperlipidemia 03/09/2019  . DM (diabetes mellitus) ([place]) 03/07/2019  . HTN (hypertension) 03/07/2019  . Alcohol use 03/07/2019  . ICH (intracerebral hemorrhage) ([place]) - Hypertensive L Basal Ganglia ICH 03/05/2019    Carey Bullocks, OTR/L 05/31/2019, 10:46 AM  [facility] [address] [place], [place], [address] Phone: [phone]   Fax:  [phone]  Name: Belinda Banks MRN: [id number] Date of Birth: 16-Mar-1956

## 2019-05-31 NOTE — Telephone Encounter (Signed)
His symptoms have severely worsened such as complete visual loss, would recommend evaluation in the ED to ensure no worsening or new stroke.  Also agree with calling ophthalmology to possibly be seen sooner will be able to do a more detailed assessment then we can in our office.  No indication for patient to be seen sooner in our office and would advise to proceed with above recommendations

## 2019-05-31 NOTE — Telephone Encounter (Signed)
I called pt and relayed that if severe change or complete vision loss to seek care at ED to ensure not new or worsening stroke.  She did not feel like this was that.  She will call Dr. Tommy Rainwater, local opthamologist, and then I will check with [facility] to see if can be seen sooner.

## 2019-06-01 NOTE — Telephone Encounter (Signed)
Called Dr. Carlye Grippe' office and was able to move up appt for 06-03-19 at 0740.  Pt accepted appt.

## 2019-06-02 ENCOUNTER — Other Ambulatory Visit: Payer: Self-pay

## 2019-06-02 ENCOUNTER — Ambulatory Visit: Payer: Managed Care, Other (non HMO) | Admitting: Occupational Therapy

## 2019-06-02 DIAGNOSIS — R41840 Attention and concentration deficit: Secondary | ICD-10-CM

## 2019-06-02 DIAGNOSIS — R41842 Visuospatial deficit: Secondary | ICD-10-CM

## 2019-06-02 DIAGNOSIS — R41844 Frontal lobe and executive function deficit: Secondary | ICD-10-CM

## 2019-06-02 NOTE — Therapy (Signed)
[facility] [address] [place], [place], [address] Phone: [phone]   Fax:  [phone]  Occupational Therapy Treatment  Patient Details  Name: Belinda Banks MRN: [id number] Date of Birth: January 10, 1956 Referring Provider (OT): Dr. Erlinda Hong   Encounter Date: 06/02/2019  OT End of Session - 06/02/19 1047    Visit Number  13    Number of Visits  25    Date for OT Re-Evaluation  06/21/19    Authorization Type  [employer]    Authorization Time Period  60 days    Authorization - Visit Number  13    Authorization - Number of Visits  30    OT Start Time  1020    OT Stop Time  1100    OT Time Calculation (min)  40 min    Activity Tolerance  Patient tolerated treatment well    Behavior During Therapy  [facility] for tasks assessed/performed       Past Medical History:  Diagnosis Date  . Diabetes mellitus without complication ([place])    type 2  . Hypertension    controlled    Past Surgical History:  Procedure Laterality Date  . ABDOMINAL HYSTERECTOMY    . BACK SURGERY    . BUBBLE STUDY  03/14/2019   Procedure: BUBBLE STUDY;  Surgeon: [name], MD;  Location: [place];  Service: Cardiovascular;;  . TEE WITHOUT CARDIOVERSION N/A 03/14/2019   Procedure: TRANSESOPHAGEAL ECHOCARDIOGRAM (TEE);  Surgeon: [name] [name], MD;  Location: [facility] ENDOSCOPY;  Service: Cardiovascular;  Laterality: N/A;    There were no vitals filed for this visit.  Subjective Assessment - 06/02/19 1020    Subjective   My vision is better today. I was able to get an appointment with the opthamalogist earlier than April - I see him tomorrow.    Patient is accompanied by:  Family member   husband   Pertinent History  ICH 03/05/19    Limitations  fall risk, no driving    Patient Stated Goals  improve vision    Currently in Pain?  No/denies       Pt made grilled cheese w/ initial cues to gather items needed due to unfamiliar kitchen. Pt noted to  forget where she put items d/t visual memory deficits. Pt also w/ decreased sequencing/planning ahead (forgot to get pan out before carrying grilled cheese to stove, then she realized she had not gotten out). Pt however I'ly able to turn correct stove eye on and turned off when done.   Copying phone numbers from Lt to Rt side of page (76M print size) using line guide w/ several self corrected errors and extra time required. Pt also had 3 errors remaining after self correcting and noted difficulty w/ spacing when writing numbers.  Pt then counting amount of times double #'s appear on each line w/ extra time required w/ 3 errors/omissions entire page Gave one page for homework (crossing out #'s in order)                      OT Short Term Goals - 05/11/19 0923      OT SHORT TERM GOAL #1   Title  Pt/ caregiver will be I with inital HEP.due 05/07/2019    Time  6    Period  Weeks    Status  Achieved    Target Date  05/07/19      OT SHORT TERM GOAL #2   Title  Pt./caregiver will verbalize understanding of visual compensation strategies    Time  6    Period  Weeks    Status  Achieved      OT SHORT TERM GOAL #3   Title  Pt will perform tabletop scanning activities with 75% accuracy and min v.c    Time  6    Period  Weeks    Status  Achieved      OT SHORT TERM GOAL #4   Title  Pt will demonstrate improved fine motor coordination for ADLS as evidenced by decreasing 9 hole peg test scaore to 50 secs or less.    Time  6    Period  Weeks    Status  Achieved   Rt = 26.09 sec, Lt = 21.88 sec     OT SHORT TERM GOAL #5   Title  Pt will navigate a  minimally distracting environment and locate items with 75% or better accuracy.    Time  6    Period  Weeks    Status  Achieved      OT SHORT TERM GOAL #6   Title  Pt will demonstrate ability to read a large print sentence with no more than min v.c    Time  6    Period  Weeks    Status  Achieved      OT SHORT TERM GOAL #7    Title  Pt will perform simple familiar home managment tasks with no more than min A.    Time  6    Period  Weeks    Status  Achieved   Pt reports doing laundry and simple cooking w/ supervision/min assist prn       OT Long Term Goals - 03/23/19 1735      OT LONG TERM GOAL #1   Title  Pt will be modified indpendent with all basic ADLs.-06/21/2019    Time  12    Period  Weeks    Status  New    Target Date  06/21/19      OT LONG TERM GOAL #2   Title  Pt will perform mod complex home management  with supervision, min v.c    Time  12    Period  Weeks    Status  New      OT LONG TERM GOAL #3   Title  Pt will perfrom basic cooking with min A    Time  12    Period  Weeks    Status  New      OT LONG TERM GOAL #4   Title  Pt will demonstrate ability to read a short large print paragraph with no more than min v.c    Time  12    Period  Weeks    Status  New      OT LONG TERM GOAL #5   Title  Pt will perform tabletop scanning activities with 90% or better accuracy.    Time  12    Period  Weeks    Status  New      OT LONG TERM GOAL #6   Title  Pt will perform environmental scanning with 90% or better accuracy in a mod distracting environment.    Time  12    Period  Weeks    Status  New            Plan - 06/02/19 1048    Clinical Impression Statement  Pt w/ better vision  today however vision fluctuates and she fatigues quicker. Encouraged pt to discuss with opthamalogist tomorrow. ? migraine symptoms due to previous history w/ them    Occupational performance deficits (Please refer to evaluation for details):  ADL's;IADL's;Work;Leisure;Social Participation    Body Structure / Function / Physical Skills  ADL;UE functional use;Flexibility;Vision;FMC;Cardiopulmonary status limiting activity;Balance;Continence;Gait;ROM;GMC;Coordination;Decreased knowledge of precautions;IADL;Decreased knowledge of use of DME;Dexterity;Mobility;Strength    Rehab Potential  Good    OT Frequency   2x / week    OT Duration  12 weeks    OT Treatment/Interventions  Self-care/ADL training;Visual/perceptual remediation/compensation;Patient/family education;Aquatic Therapy;DME and/or AE instruction;Paraffin;Passive range of motion;Balance training;Cryotherapy;Fluidtherapy;Functional Mobility Training;Moist Heat;Therapeutic exercise;Manual Therapy;Therapeutic activities;Neuromuscular education;Cognitive remediation/compensation    Plan  check homework, practice reading, constant therapy, copy PVC pipe design    Consulted and Agree with Plan of Care  Patient;Family member/caregiver    Family Member Consulted  husband       Patient will benefit from skilled therapeutic intervention in order to improve the following deficits and impairments:   Body Structure / Function / Physical Skills: ADL, UE functional use, Flexibility, Vision, FMC, Cardiopulmonary status limiting activity, Balance, Continence, Gait, ROM, GMC, Coordination, Decreased knowledge of precautions, IADL, Decreased knowledge of use of DME, Dexterity, Mobility, Strength       Visit Diagnosis: Visuospatial deficit  Attention and concentration deficit  Frontal lobe and executive function deficit    Problem List Patient Active Problem List   Diagnosis Date Noted  . CVA (cerebral vascular accident) ([place]) 03/11/2019  . Hypertensive emergency 03/09/2019  . Hyperlipidemia 03/09/2019  . DM (diabetes mellitus) ([place]) 03/07/2019  . HTN (hypertension) 03/07/2019  . Alcohol use 03/07/2019  . 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 [place], [place], [address] Phone: [phone]   Fax:  [phone]  Name: Belinda Banks MRN: [id number] Date of Birth: 1955-10-27

## 2019-06-03 LAB — HM DIABETES EYE EXAM

## 2019-06-06 ENCOUNTER — Telehealth: Payer: Self-pay | Admitting: General Practice

## 2019-06-06 MED ORDER — LABETALOL HCL 100 MG PO TABS: 100.0000 mg | ORAL_TABLET | Freq: Three times a day (TID) | ORAL | 0 refills | Status: DC

## 2019-06-06 MED ORDER — LOSARTAN POTASSIUM 50 MG PO TABS: 50.0000 mg | ORAL_TABLET | Freq: Two times a day (BID) | ORAL | 0 refills | Status: DC

## 2019-06-06 MED ORDER — METFORMIN HCL 500 MG PO TABS: 500.0000 mg | ORAL_TABLET | Freq: Two times a day (BID) | ORAL | 0 refills | Status: DC

## 2019-06-06 MED ORDER — ASPIRIN 81 MG PO CHEW: 81.0000 mg | CHEWABLE_TABLET | Freq: Every day | ORAL | 0 refills | Status: DC

## 2019-06-06 MED ORDER — AMLODIPINE BESYLATE 10 MG PO TABS: 10.0000 mg | ORAL_TABLET | Freq: Every day | ORAL | 0 refills | Status: DC

## 2019-06-06 NOTE — Telephone Encounter (Signed)
Rxs sent

## 2019-06-06 NOTE — Telephone Encounter (Signed)
1) Medication(s) Requested (by name): amLODipine (NORVASC) 10 MG tablet [id number]  aspirin 81 MG chewable tablet [id number]  labetalol (NORMODYNE) 100 MG tablet [id number]  losartan (COZAAR) 50 MG tablet [id number]  metFORMIN (GLUCOPHAGE) 500 MG tablet [id number]   2) Pharmacy of Choice:  [id number] - [place], [address], [place] [address]   Patient requesting 90 day supply   Approved medications will be sent to pharmacy, we will reach out to you if there is an issue.  Requests made after 3pm may not be addressed until following business day!

## 2019-06-07 ENCOUNTER — Ambulatory Visit: Payer: Managed Care, Other (non HMO) | Admitting: Occupational Therapy

## 2019-06-07 ENCOUNTER — Other Ambulatory Visit: Payer: Self-pay

## 2019-06-07 DIAGNOSIS — R41842 Visuospatial deficit: Secondary | ICD-10-CM | POA: Diagnosis not present

## 2019-06-07 DIAGNOSIS — R41844 Frontal lobe and executive function deficit: Secondary | ICD-10-CM

## 2019-06-07 DIAGNOSIS — R41840 Attention and concentration deficit: Secondary | ICD-10-CM

## 2019-06-07 NOTE — Therapy (Signed)
[facility] [address] [place], [place], [address] Phone: [phone]   Fax:  [phone]  Occupational Therapy Treatment  Patient Details  Name: Belinda Banks MRN: [id number] Date of Birth: 09/12/1955 Referring Provider (OT): Dr. Erlinda Hong   Encounter Date: 06/07/2019  OT End of Session - 06/07/19 1114    Visit Number  14    Number of Visits  25    Date for OT Re-Evaluation  06/21/19    Authorization Type  [employer]ign[employer]    Authorization Time Period  60 days    Authorization - Visit Number  14    Authorization - Number of Visits  30    OT Start Time  1020    OT Stop Time  1105    OT Time Calculation (min)  45 min    Activity Tolerance  Patient tolerated treatment well    Behavior During Therapy  [facility] for tasks assessed/performed       Past Medical History:  Diagnosis Date  . Diabetes mellitus without complication ([place])    type 2  . Hypertension    controlled    Past Surgical History:  Procedure Laterality Date  . ABDOMINAL HYSTERECTOMY    . BACK SURGERY    . BUBBLE STUDY  03/14/2019   Procedure: BUBBLE STUDY;  Surgeon: [name], MD;  Location: [place] [facility];  Service: Cardiovascular;;  . TEE WITHOUT CARDIOVERSION N/A 03/14/2019   Procedure: TRANSESOPHAGEAL ECHOCARDIOGRAM (TEE);  Surgeon: [name] [name], MD;  Location: [facility] ENDOSCOPY;  Service: Cardiovascular;  Laterality: N/A;    There were no vitals filed for this visit.  Subjective Assessment - 06/07/19 1026    Subjective   I went to see the neuro-opthamalogist last Friday and he said there was nothing he could do but he's sending me to the [place] clinic at [facility] and sending me somewhere else to get prisms in my glasses    Patient is accompanied by:  Family member    Pertinent History  ICH 03/05/19    Patient Stated Goals  improve vision    Currently in Pain?  No/denies       Pt saw neuro opthalmologist and reports there is nothing further he can do  but sending pt to [facility] low vision clinic and to get prisms for loss of peripheral vision (particularly lower fields).  Copying PVC pipe design (simple) with initial set up for organization of pieces but completing design correctly. Pt then copying more complex design w/ min assist/mod cueing to correct mistakes.  Constant Therapy: Finding alternating symbols (for visual scanning, alternating attn, and visual memory) level 3 at 98% accuracy and min difficulty, level 5 82% accuracy at mod difficulty. Reading maps level 1 w/ 100% accuracy and min to mod cueing. Reading comprehension (single paragraph) w/ min errors Pt practiced reading in central vision which was better than lower fields of vision.                        OT Short Term Goals - 05/11/19 0923      OT SHORT TERM GOAL #1   Title  Pt/ caregiver will be I with inital HEP.due 05/07/2019    Time  6    Period  Weeks    Status  Achieved    Target Date  05/07/19      OT SHORT TERM GOAL #2   Title  Pt./caregiver will verbalize understanding of visual compensation strategies  Time  6    Period  Weeks    Status  Achieved      OT SHORT TERM GOAL #3   Title  Pt will perform tabletop scanning activities with 75% accuracy and min v.c    Time  6    Period  Weeks    Status  Achieved      OT SHORT TERM GOAL #4   Title  Pt will demonstrate improved fine motor coordination for ADLS as evidenced by decreasing 9 hole peg test scaore to 50 secs or less.    Time  6    Period  Weeks    Status  Achieved   Rt = 26.09 sec, Lt = 21.88 sec     OT SHORT TERM GOAL #5   Title  Pt will navigate a  minimally distracting environment and locate items with 75% or better accuracy.    Time  6    Period  Weeks    Status  Achieved      OT SHORT TERM GOAL #6   Title  Pt will demonstrate ability to read a large print sentence with no more than min v.c    Time  6    Period  Weeks    Status  Achieved      OT SHORT TERM GOAL #7   Title   Pt will perform simple familiar home managment tasks with no more than min A.    Time  6    Period  Weeks    Status  Achieved   Pt reports doing laundry and simple cooking w/ supervision/min assist prn       OT Long Term Goals - 03/23/19 1735      OT LONG TERM GOAL #1   Title  Pt will be modified indpendent with all basic ADLs.-06/21/2019    Time  12    Period  Weeks    Status  New    Target Date  06/21/19      OT LONG TERM GOAL #2   Title  Pt will perform mod complex home management  with supervision, min v.c    Time  12    Period  Weeks    Status  New      OT LONG TERM GOAL #3   Title  Pt will perfrom basic cooking with min A    Time  12    Period  Weeks    Status  New      OT LONG TERM GOAL #4   Title  Pt will demonstrate ability to read a short large print paragraph with no more than min v.c    Time  12    Period  Weeks    Status  New      OT LONG TERM GOAL #5   Title  Pt will perform tabletop scanning activities with 90% or better accuracy.    Time  12    Period  Weeks    Status  New      OT LONG TERM GOAL #6   Title  Pt will perform environmental scanning with 90% or better accuracy in a mod distracting environment.    Time  12    Period  Weeks    Status  New            Plan - 06/07/19 1119    Clinical Impression Statement  Pt reports worse vision in lower fields (per othamalogy report) and will be getting prisms  for glasses.    Occupational performance deficits (Please refer to evaluation for details):  ADL's;IADL's;Work;Leisure;Social Participation    Body Structure / Function / Physical Skills  ADL;UE functional use;Flexibility;Vision;FMC;Cardiopulmonary status limiting activity;Balance;Continence;Gait;ROM;GMC;Coordination;Decreased knowledge of precautions;IADL;Decreased knowledge of use of DME;Dexterity;Mobility;Strength    Rehab Potential  Good    OT Frequency  2x / week    OT Duration  12 weeks    OT Treatment/Interventions  Self-care/ADL  training;Visual/perceptual remediation/compensation;Patient/family education;Aquatic Therapy;DME and/or AE instruction;Paraffin;Passive range of motion;Balance training;Cryotherapy;Fluidtherapy;Functional Mobility Training;Moist Heat;Therapeutic exercise;Manual Therapy;Therapeutic activities;Neuromuscular education;Cognitive remediation/compensation    Plan  check LTG's and prep for d/c next week   Consulted and Agree with Plan of Care  Patient;Family member/caregiver    Family Member Consulted  husband       Patient will benefit from skilled therapeutic intervention in order to improve the following deficits and impairments:   Body Structure / Function / Physical Skills: ADL, UE functional use, Flexibility, Vision, FMC, Cardiopulmonary status limiting activity, Balance, Continence, Gait, ROM, GMC, Coordination, Decreased knowledge of precautions, IADL, Decreased knowledge of use of DME, Dexterity, Mobility, Strength       Visit Diagnosis: Visuospatial deficit  Attention and concentration deficit  Frontal lobe and executive function deficit    Problem List Patient Active Problem List   Diagnosis Date Noted  . CVA (cerebral vascular accident) ([place]) 03/11/2019  . Hypertensive emergency 03/09/2019  . Hyperlipidemia 03/09/2019  . DM (diabetes mellitus) ([place]) 03/07/2019  . HTN (hypertension) 03/07/2019  . Alcohol use 03/07/2019  . ICH (intracerebral hemorrhage) ([place]) - Hypertensive L Basal Ganglia ICH 03/05/2019    Carey Bullocks, OTR/L 06/07/2019, 11:21 AM  [facility] [address] [place], [place], [address] Phone: [phone]   Fax:  [phone]  Name: Belinda Banks MRN: [id number] Date of Birth: 1956-04-12

## 2019-06-09 ENCOUNTER — Encounter: Payer: Managed Care, Other (non HMO) | Admitting: Occupational Therapy

## 2019-06-09 ENCOUNTER — Ambulatory Visit: Payer: Managed Care, Other (non HMO) | Admitting: Occupational Therapy

## 2019-06-14 ENCOUNTER — Other Ambulatory Visit: Payer: Self-pay

## 2019-06-14 ENCOUNTER — Encounter: Payer: Managed Care, Other (non HMO) | Admitting: Occupational Therapy

## 2019-06-14 ENCOUNTER — Ambulatory Visit: Payer: Managed Care, Other (non HMO) | Admitting: Occupational Therapy

## 2019-06-14 DIAGNOSIS — R41842 Visuospatial deficit: Secondary | ICD-10-CM

## 2019-06-14 DIAGNOSIS — R41840 Attention and concentration deficit: Secondary | ICD-10-CM

## 2019-06-14 NOTE — Therapy (Signed)
[facility] [address] [place], [place], [address] Phone: [phone]   Fax:  [phone]  Occupational Therapy Treatment  Patient Details  Name: Belinda Banks MRN: [id number] Date of Birth: 06/16/1955 Referring Provider (OT): Dr. Erlinda Hong   Encounter Date: 06/14/2019  OT End of Session - 06/14/19 1227    Visit Number  15    Number of Visits  25    Date for OT Re-Evaluation  06/21/19    Authorization Type  [employer]    Authorization Time Period  60 days    Authorization - Visit Number  15    Authorization - Number of Visits  30    OT Start Time  1015    OT Stop Time  1100    OT Time Calculation (min)  45 min    Activity Tolerance  Patient tolerated treatment well    Behavior During Therapy  [facility] for tasks assessed/performed       Past Medical History:  Diagnosis Date  . Diabetes mellitus without complication ([place])    type 2  . Hypertension    controlled    Past Surgical History:  Procedure Laterality Date  . ABDOMINAL HYSTERECTOMY    . BACK SURGERY    . BUBBLE STUDY  03/14/2019   Procedure: BUBBLE STUDY;  Surgeon: [name], MD;  Location: [facility];  Service: Cardiovascular;;  . TEE WITHOUT CARDIOVERSION N/A 03/14/2019   Procedure: TRANSESOPHAGEAL ECHOCARDIOGRAM (TEE);  Surgeon: [name] [name], MD;  Location: [facility] ENDOSCOPY;  Service: Cardiovascular;  Laterality: N/A;    There were no vitals filed for this visit.  Subjective Assessment - 06/14/19 1023    Subjective   I've been getting some dizziness when I stand up    Patient is accompanied by:  Family member    Pertinent History  ICH 03/05/19    Limitations  fall risk, no driving    Patient Stated Goals  improve vision    Currently in Pain?  No/denies       Discussed dizziness and pt reports it only happens when she stands up, not any other position changes. Therapist encouraged pt to discuss w/ PCP as it could be orthostatic hypotension.  Recommended pt stand up slowly and then wait 2 seconds before beginning to walk. Also discussed report from neuro-opthamologist and scanned copy is now in EPIC Reviewed/assessed goals and progress to date in prep for d/c next session - see goal section for details.  Pt practiced reading large print paragraph and answering questions for reading comprehension on Constant Therapy - pt could read text but required mod cueing for comprehension.  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  Also recommended good lighting, high contrast when able, and decreased glare with visual tasks.                       OT Short Term Goals - 05/11/19 0923      OT SHORT TERM GOAL #1   Title  Pt/ caregiver will be I with inital HEP.due 05/07/2019    Time  6    Period  Weeks    Status  Achieved    Target Date  05/07/19  OT SHORT TERM GOAL #2   Title  Pt./caregiver will verbalize understanding of visual compensation strategies    Time  6    Period  Weeks    Status  Achieved      OT SHORT TERM GOAL #3   Title  Pt will perform tabletop scanning activities with 75% accuracy and min v.c    Time  6    Period  Weeks    Status  Achieved      OT SHORT TERM GOAL #4   Title  Pt will demonstrate improved fine motor coordination for ADLS as evidenced by decreasing 9 hole peg test scaore to 50 secs or less.    Time  6    Period  Weeks    Status  Achieved   Rt = 26.09 sec, Lt = 21.88 sec     OT SHORT TERM GOAL #5   Title  Pt will navigate a  minimally distracting environment and locate items with 75% or better accuracy.    Time  6    Period  Weeks    Status  Achieved      OT SHORT TERM GOAL #6   Title   Pt will demonstrate ability to read a large print sentence with no more than min v.c    Time  6    Period  Weeks    Status  Achieved      OT SHORT TERM GOAL #7   Title  Pt will perform simple familiar home managment tasks with no more than min A.    Time  6    Period  Weeks    Status  Achieved   Pt reports doing laundry and simple cooking w/ supervision/min assist prn       OT Long Term Goals - 06/14/19 1228      OT LONG TERM GOAL #1   Title  Pt will be modified indpendent with all basic ADLs.-06/21/2019    Time  12    Period  Weeks    Status  Achieved      OT LONG TERM GOAL #2   Title  Pt will perform mod complex home management  with supervision, min v.c    Time  12    Period  Weeks    Status  Achieved      OT LONG TERM GOAL #3   Title  Pt will perfrom basic cooking with min A    Time  12    Period  Weeks    Status  Achieved      OT LONG TERM GOAL #4   Title  Pt will demonstrate ability to read a short large print paragraph with no more than min v.c    Time  12    Period  Weeks    Status  Achieved      OT LONG TERM GOAL #5   Title  Pt will perform tabletop scanning activities with 90% or better accuracy.    Time  12    Period  Weeks    Status  Achieved   w/ extra time     OT LONG TERM GOAL #6   Title  Pt will perform environmental scanning with 90% or better accuracy in a mod distracting environment.    Time  12    Period  Weeks    Status  New            Plan - 06/14/19 1229    Clinical Impression  Statement  Pt has met all STG's and most LTG's at this time. Pt brings in report from neuro-opthamalogist (scanned in EPIC)    Occupational performance deficits (Please refer to evaluation for details):  ADL's;IADL's;Work;Leisure;Social Participation    Body Structure / Function / Physical Skills  ADL;UE functional use;Flexibility;Vision;FMC;Cardiopulmonary status limiting activity;Balance;Continence;Gait;ROM;GMC;Coordination;Decreased knowledge of  precautions;IADL;Decreased knowledge of use of DME;Dexterity;Mobility;Strength    OT Frequency  2x / week    OT Duration  12 weeks    OT Treatment/Interventions  Self-care/ADL training;Visual/perceptual remediation/compensation;Patient/family education;Aquatic Therapy;DME and/or AE instruction;Paraffin;Passive range of motion;Balance training;Cryotherapy;Fluidtherapy;Functional Mobility Training;Moist Heat;Therapeutic exercise;Manual Therapy;Therapeutic activities;Neuromuscular education;Cognitive remediation/compensation    Plan  check remaining LTG and d/c next session. Pt to go to low vision clinic at [facility] for remaining visual needs.    Consulted and Agree with Plan of Care  Patient;Family member/caregiver    Family Member Consulted  husband       Patient will benefit from skilled therapeutic intervention in order to improve the following deficits and impairments:   Body Structure / Function / Physical Skills: ADL, UE functional use, Flexibility, Vision, FMC, Cardiopulmonary status limiting activity, Balance, Continence, Gait, ROM, GMC, Coordination, Decreased knowledge of precautions, IADL, Decreased knowledge of use of DME, Dexterity, Mobility, Strength       Visit Diagnosis: Visuospatial deficit  Attention and concentration deficit    Problem List Patient Active Problem List   Diagnosis Date Noted  . CVA (cerebral vascular accident) ([place]) 03/11/2019  . Hypertensive emergency 03/09/2019  . Hyperlipidemia 03/09/2019  . DM (diabetes mellitus) ([place]) 03/07/2019  . HTN (hypertension) 03/07/2019  . Alcohol use 03/07/2019  . ICH (intracerebral hemorrhage) ([place]) - Hypertensive L Basal Ganglia ICH 03/05/2019    Carey Bullocks, OTR/L 06/14/2019, 2:29 PM  [facility] [address] [place], [place], [address] Phone: [phone]   Fax:  [phone]  Name: YOHANA BARTHA MRN: [id number] Date of Birth: Jan 04, 1956

## 2019-06-16 ENCOUNTER — Encounter: Payer: Managed Care, Other (non HMO) | Admitting: Occupational Therapy

## 2019-06-16 ENCOUNTER — Other Ambulatory Visit: Payer: Self-pay

## 2019-06-16 ENCOUNTER — Ambulatory Visit: Payer: Managed Care, Other (non HMO) | Admitting: Occupational Therapy

## 2019-06-16 DIAGNOSIS — R41842 Visuospatial deficit: Secondary | ICD-10-CM

## 2019-06-16 DIAGNOSIS — R41840 Attention and concentration deficit: Secondary | ICD-10-CM

## 2019-06-16 NOTE — Therapy (Signed)
[facility] [address] [place], [place], [address] Phone: [phone]   Fax:  [phone]  Occupational Therapy Treatment  Patient Details  Name: Belinda Banks MRN: [id number] Date of Birth: 11-11-1955 Referring Provider (OT): Dr. Erlinda Hong   Encounter Date: 06/16/2019  OT End of Session - 06/16/19 1054    Visit Number  16    Number of Visits  25    Date for OT Re-Evaluation  06/21/19    Authorization Type  [employer]    Authorization Time Period  60 days    Authorization - Visit Number  16    Authorization - Number of Visits  30    OT Start Time  1015    OT Stop Time  1050    OT Time Calculation (min)  35 min    Activity Tolerance  Patient tolerated treatment well    Behavior During Therapy  [facility] for tasks assessed/performed       Past Medical History:  Diagnosis Date  . Diabetes mellitus without complication ([place])    type 2  . Hypertension    controlled    Past Surgical History:  Procedure Laterality Date  . ABDOMINAL HYSTERECTOMY    . BACK SURGERY    . BUBBLE STUDY  03/14/2019   Procedure: BUBBLE STUDY;  Surgeon: [name], MD;  Location: [facility];  Service: Cardiovascular;;  . TEE WITHOUT CARDIOVERSION N/A 03/14/2019   Procedure: TRANSESOPHAGEAL ECHOCARDIOGRAM (TEE);  Surgeon: [name] [name], MD;  Location: [facility] ENDOSCOPY;  Service: Cardiovascular;  Laterality: N/A;    There were no vitals filed for this visit.  Subjective Assessment - 06/16/19 1017    Subjective   I don't have the appointment for the low vision clinic yet    Patient is accompanied by:  Family member   husband   Pertinent History  ICH 03/05/19    Limitations  fall risk, no driving    Patient Stated Goals  improve vision    Currently in Pain?  No/denies                   OT Treatments/Exercises (OP) - 06/16/19 0001      ADLs   ADL Comments  Reviewed goals and progress to date w/ pt and husband      Visual/Perceptual Exercises   Scanning  Tabletop;Environmental    Scanning - Environmental  Pt found 11/13 items on first pass (85% accuracy) w/ extra time and several stops required in minimal distracting environment. Pt found 1/2 remaining items on 2nd pass (1 in lower quadrant)     Scanning - Tabletop  large print simple word search w/ extra time required               OT Short Term Goals - 05/11/19 0923      OT SHORT TERM GOAL #1   Title  Pt/ caregiver will be I with inital HEP.due 05/07/2019    Time  6    Period  Weeks    Status  Achieved    Target Date  05/07/19      OT SHORT TERM GOAL #2   Title  Pt./caregiver will verbalize understanding of visual compensation strategies    Time  6    Period  Weeks    Status  Achieved      OT SHORT TERM GOAL #3   Title  Pt will perform tabletop scanning activities with 75% accuracy and min v.c  Time  6    Period  Weeks    Status  Achieved      OT SHORT TERM GOAL #4   Title  Pt will demonstrate improved fine motor coordination for ADLS as evidenced by decreasing 9 hole peg test scaore to 50 secs or less.    Time  6    Period  Weeks    Status  Achieved   Rt = 26.09 sec, Lt = 21.88 sec     OT SHORT TERM GOAL #5   Title  Pt will navigate a  minimally distracting environment and locate items with 75% or better accuracy.    Time  6    Period  Weeks    Status  Achieved      OT SHORT TERM GOAL #6   Title  Pt will demonstrate ability to read a large print sentence with no more than min v.c    Time  6    Period  Weeks    Status  Achieved      OT SHORT TERM GOAL #7   Title  Pt will perform simple familiar home managment tasks with no more than min A.    Time  6    Period  Weeks    Status  Achieved   Pt reports doing laundry and simple cooking w/ supervision/min assist prn       OT Long Term Goals - 06/16/19 1035      OT LONG TERM GOAL #1   Title  Pt will be modified indpendent with all basic ADLs.-06/21/2019    Time   12    Period  Weeks    Status  Achieved      OT LONG TERM GOAL #2   Title  Pt will perform mod complex home management  with supervision, min v.c    Time  12    Period  Weeks    Status  Achieved      OT LONG TERM GOAL #3   Title  Pt will perfrom basic cooking with min A    Time  12    Period  Weeks    Status  Achieved      OT LONG TERM GOAL #4   Title  Pt will demonstrate ability to read a short large print paragraph with no more than min v.c    Time  12    Period  Weeks    Status  Achieved      OT LONG TERM GOAL #5   Title  Pt will perform tabletop scanning activities with 90% or better accuracy.    Time  12    Period  Weeks    Status  Achieved   w/ extra time     OT LONG TERM GOAL #6   Title  Pt will perform environmental scanning with 90% or better accuracy in a mod distracting environment.    Time  12    Period  Weeks    Status  Not Met   85% accuracy           Plan - 06/16/19 1047    Clinical Impression Statement  Pt has met all goals except last LTG (environmental scanning)    Occupational performance deficits (Please refer to evaluation for details):  ADL's;IADL's;Work;Leisure;Social Participation    Body Structure / Function / Physical Skills  ADL;UE functional use;Flexibility;Vision;FMC;Cardiopulmonary status limiting activity;Balance;Continence;Gait;ROM;GMC;Coordination;Decreased knowledge of precautions;IADL;Decreased knowledge of use of DME;Dexterity;Mobility;Strength    OT Frequency  2x /  week    OT Duration  12 weeks    OT Treatment/Interventions  Self-care/ADL training;Visual/perceptual remediation/compensation;Patient/family education;Aquatic Therapy;DME and/or AE instruction;Paraffin;Passive range of motion;Balance training;Cryotherapy;Fluidtherapy;Functional Mobility Training;Moist Heat;Therapeutic exercise;Manual Therapy;Therapeutic activities;Neuromuscular education;Cognitive remediation/compensation    Plan  D/C O.Donnajean Lopes and Agree with  Plan of Care  Patient;Family member/caregiver    Family Member Consulted  husband       Patient will benefit from skilled therapeutic intervention in order to improve the following deficits and impairments:   Body Structure / Function / Physical Skills: ADL, UE functional use, Flexibility, Vision, FMC, Cardiopulmonary status limiting activity, Balance, Continence, Gait, ROM, GMC, Coordination, Decreased knowledge of precautions, IADL, Decreased knowledge of use of DME, Dexterity, Mobility, Strength       Visit Diagnosis: Visuospatial deficit  Attention and concentration deficit    Problem List Patient Active Problem List   Diagnosis Date Noted  . CVA (cerebral vascular accident) ([place]) 03/11/2019  . Hypertensive emergency 03/09/2019  . Hyperlipidemia 03/09/2019  . DM (diabetes mellitus) ([place]) 03/07/2019  . HTN (hypertension) 03/07/2019  . Alcohol use 03/07/2019  . 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  Patient goals were met. Patient is being discharged due to meeting the stated rehab goals.  Pt to be seen by low vision clinic at [facility] for further needs?????         Carey Bullocks, OTR/L 06/16/2019, 10:55 AM  [facility] [address] [place], [place], [address] Phone: [phone]   Fax:  [phone]  Name: Belinda Banks MRN: [id number] Date of Birth: 1955/12/06

## 2019-06-20 ENCOUNTER — Ambulatory Visit: Payer: Managed Care, Other (non HMO) | Admitting: Cardiology

## 2019-06-20 ENCOUNTER — Encounter: Payer: Self-pay | Admitting: Cardiology

## 2019-06-20 ENCOUNTER — Other Ambulatory Visit: Payer: Self-pay

## 2019-06-20 VITALS — BP 106/70 | HR 82 | Ht 67.5 in | Wt 165.6 lb

## 2019-06-20 DIAGNOSIS — I639 Cerebral infarction, unspecified: Secondary | ICD-10-CM | POA: Diagnosis not present

## 2019-06-20 NOTE — Patient Instructions (Signed)
Medication Instructions:  Your physician recommends that you continue on your current medications as directed. Please refer to the Current Medication list given to you today.  *If you need a refill on your cardiac medications before your next appointment, please call your pharmacy*   Lab Work: None ordered   Testing/Procedures: None ordered   Follow-Up: At [facility], you and your health needs are our priority.  As part of our continuing mission to provide you with exceptional heart care, we have created designated Provider Care Teams.  These Care Teams include your primary Cardiologist (physician) and Advanced Practice Providers (APPs -  Physician Assistants and Nurse Practitioners) who all work together to provide you with the care you need, when you need it.   Your next appointment:    To be determined once Dr. Curt Bears has spoken with Neurology   Thank you for choosing [facility]!!   Trinidad Curet, RN [phone]    Other Instructions   Implantable Loop Recorder Placement  An implantable loop recorder is a small electronic device that is placed under the skin of your chest. It is about the size of an AA ("double A") battery. The device records the electrical activity of your heart over a long period of time. Your health care provider can download these recordings to monitor your heart. You may need an implantable loop recorder if you have periods of abnormal heart activity (arrhythmias) or unexplained fainting (syncope). The recorder can be left in place for 1 year or longer. Tell a health care provider about:  Any allergies you have.  All medicines you are taking, including vitamins, herbs, eye drops, creams, and over-the-counter medicines.  Any problems you or family members have had with anesthetic medicines.  Any blood disorders you have.  Any surgeries you have had.  Any medical conditions you have.  Whether you are pregnant or may be pregnant.  What are the risks? Generally, this is a safe procedure. However, problems may occur, including:  Infection.  Bleeding.  Allergic reactions to anesthetic medicines.  Damage to nerves or blood vessels.  Failure of the device to work. This could require another surgery to replace it. What happens before the procedure?   You may have a physical exam, blood tests, and imaging tests of your heart, such as a chest X-ray.  Follow instructions from your health care provider about eating or drinking restrictions.  Ask your health care provider about: ? Changing or stopping your regular medicines. This is especially important if you are taking diabetes medicines or blood thinners. ? Taking medicines such as aspirin and ibuprofen. These medicines can thin your blood. Do not take these medicines unless your health care provider tells you to take them. ? Taking over-the-counter medicines, vitamins, herbs, and supplements.  Ask your health care provider how your surgical site will be marked or identified.  Ask your health care provider what steps will be taken to help prevent infection. These may include: ? Removing hair at the surgery site. ? Washing skin with a germ-killing soap.  Plan to have someone take you home from the hospital or clinic.  Plan to have a responsible adult care for you for at least 24 hours after you leave the hospital or clinic. This is important.  Do not use any products that contain nicotine or tobacco, such as cigarettes and e-cigarettes. If you need help quitting, ask your health care provider. What happens during the procedure?  An IV will be inserted into one  of your veins.  You may be given one or more of the following: ? A medicine to help you relax (sedative). ? A medicine to numb the area (local anesthetic).  A small incision will be made on the left side of your upper chest.  A pocket will be created under your skin.  The device will be placed in  the pocket.  The incision will be closed with stitches (sutures) or adhesive strips.  A bandage (dressing) will be placed over the incision. The procedure may vary among health care providers and hospitals. What happens after the procedure?  Your blood pressure, heart rate, breathing rate, and blood oxygen level will be monitored until you leave the hospital or clinic.  You may be able to go home on the day of your surgery. Before you go home: ? Your health care provider will program your recorder. ? You will learn how to trigger your device with a handheld activator. ? You will learn how to send recordings to your health care provider. ? You will get an ID card for your device, and you will be told when to use it.  Do not drive for 24 hours if you were given a sedative during your procedure. Summary  An implantable loop recorder is a small electronic device that is placed under the skin of your chest to monitor your heart over a long period of time.  The recorder can be left in place for 1 year or longer.  Plan to have someone take you home from the hospital or clinic. This information is not intended to replace advice given to you by your health care provider. Make sure you discuss any questions you have with your health care provider. Document Revised: 06/11/2017 Document Reviewed: 05/23/2017 Elsevier Patient Education  2020 [employer].    Stroke Prevention Some medical conditions and behaviors are associated with a higher chance of having a stroke. You can help prevent a stroke by making nutrition, lifestyle, and other changes, including managing any medical conditions you may have. What nutrition changes can be made?   Eat healthy foods. You can do this by: ? Choosing foods high in fiber, such as fresh fruits and vegetables and whole grains. ? Eating at least 5 or more servings of fruits and vegetables a day. Try to fill half of your plate at each meal with fruits and  vegetables. ? Choosing lean protein foods, such as lean cuts of meat, poultry without skin, fish, tofu, beans, and nuts. ? Eating low-fat dairy products. ? Avoiding foods that are high in salt (sodium). This can help lower blood pressure. ? Avoiding foods that have saturated fat, trans fat, and cholesterol. This can help prevent high cholesterol. ? Avoiding processed and premade foods.  Follow your health care provider's specific guidelines for losing weight, controlling high blood pressure (hypertension), lowering high cholesterol, and managing diabetes. These may include: ? Reducing your daily calorie intake. ? Limiting your daily sodium intake to 1,500 milligrams (mg). ? Using only healthy fats for cooking, such as olive oil, canola oil, or sunflower oil. ? Counting your daily carbohydrate intake. What lifestyle changes can be made?  Maintain a healthy weight. Talk to your health care provider about your ideal weight.  Get at least 30 minutes of moderate physical activity at least 5 days a week. Moderate activity includes brisk walking, biking, and swimming.  Do not use any products that contain nicotine or tobacco, such as cigarettes and e-cigarettes. If you need  help quitting, ask your health care provider. It may also be helpful to avoid exposure to secondhand smoke.  Limit alcohol intake to no more than 1 drink a day for nonpregnant women and 2 drinks a day for men. One drink equals 12 oz of beer, 5 oz of wine, or 1 oz of hard liquor.  Stop any illegal drug use.  Avoid taking birth control pills. Talk to your health care provider about the risks of taking birth control pills if: ? You are over 75 years old. ? You smoke. ? You get migraines. ? You have ever had a blood clot. What other changes can be made?  Manage your cholesterol levels. ? Eating a healthy diet is important for preventing high cholesterol. If cholesterol cannot be managed through diet alone, you may also need  to take medicines. ? Take any prescribed medicines to control your cholesterol as told by your health care provider.  Manage your diabetes. ? Eating a healthy diet and exercising regularly are important parts of managing your blood sugar. If your blood sugar cannot be managed through diet and exercise, you may need to take medicines. ? Take any prescribed medicines to control your diabetes as told by your health care provider.  Control your hypertension. ? To reduce your risk of stroke, try to keep your blood pressure below 130/80. ? Eating a healthy diet and exercising regularly are an important part of controlling your blood pressure. If your blood pressure cannot be managed through diet and exercise, you may need to take medicines. ? Take any prescribed medicines to control hypertension as told by your health care provider. ? Ask your health care provider if you should monitor your blood pressure at home. ? Have your blood pressure checked every year, even if your blood pressure is normal. Blood pressure increases with age and some medical conditions.  Get evaluated for sleep disorders (sleep apnea). Talk to your health care provider about getting a sleep evaluation if you snore a lot or have excessive sleepiness.  Take over-the-counter and prescription medicines only as told by your health care provider. Aspirin or blood thinners (antiplatelets or anticoagulants) may be recommended to reduce your risk of forming blood clots that can lead to stroke.  Make sure that any other medical conditions you have, such as atrial fibrillation or atherosclerosis, are managed. What are the warning signs of a stroke? The warning signs of a stroke can be easily remembered as BEFAST.  B is for balance. Signs include: ? Dizziness. ? Loss of balance or coordination. ? Sudden trouble walking.  E is for eyes. Signs include: ? A sudden change in vision. ? Trouble seeing.  F is for face. Signs include: ?  Sudden weakness or numbness of the face. ? The face or eyelid drooping to one side.  A is for arms. Signs include: ? Sudden weakness or numbness of the arm, usually on one side of the body.  S is for speech. Signs include: ? Trouble speaking (aphasia). ? Trouble understanding.  T is for time. ? These symptoms may represent a serious problem that is an emergency. Do not wait to see if the symptoms will go away. Get medical help right away. Call your local emergency services (911 in the [place] Do not drive yourself to the hospital.  Other signs of stroke may include: ? A sudden, severe headache with no known cause. ? Nausea or vomiting. ? Seizure. Where to find more information For more information, visit:  American Stroke Association: www.strokeassociation.org  National Stroke Association: www.stroke.org Summary  You can prevent a stroke by eating healthy, exercising, not smoking, limiting alcohol intake, and managing any medical conditions you may have.  Do not use any products that contain nicotine or tobacco, such as cigarettes and e-cigarettes. If you need help quitting, ask your health care provider. It may also be helpful to avoid exposure to secondhand smoke.  Remember BEFAST for warning signs of stroke. Get help right away if you or a loved one has any of these signs. This information is not intended to replace advice given to you by your health care provider. Make sure you discuss any questions you have with your health care provider. Document Revised: 03/20/2017 Document Reviewed: 05/13/2016 Elsevier Patient Education  2020 [employer].

## 2019-06-20 NOTE — Progress Notes (Signed)
Electrophysiology Office Note   Date:  06/20/2019   ID:  [name], DOB 07/01/1955, MRN [id number]  PCP:  Patient, No Pcp Per  Cardiologist:   Primary Electrophysiologist:  Belinda Eduardo Meredith Leeds, MD    Chief Complaint: CVA   History of Present Illness: Belinda Banks is a 64 y.o. female who is being seen today for the evaluation of CVA at the request of Belinda Rakes, MD. Presenting today for electrophysiology evaluation.  Has a history of diabetes, hypertension, hyperlipidemia.  She was hospitalized 03/05/2019 for intracranial hemorrhage and 03/11/2023 subacute bilateral cerebral infarcts.  Her blood pressure was quite high.  She had right-sided weakness and right facial droop with CT scan showing intracranial hemorrhage.  She was admitted a week later with lacunar infarcts after being found with confusion and weakness.  Today, she denies symptoms of palpitations, chest pain, shortness of breath, orthopnea, PND, lower extremity edema, claudication, dizziness, presyncope, syncope, bleeding, or neurologic sequela. The patient is tolerating medications without difficulties.  He did have issues with her vision.  She has follow-up with neurology at [facility].  Otherwise she has done well.  She did have an episode of dizziness with standing.  This is at the time of taking antibiotics.  This is likely due to an orthostatic episode due to dehydration and some sort of infection.  She has not had any further episodes.   Past Medical History:  Diagnosis Date  . Diabetes mellitus without complication ([place])    type 2  . Hypertension    controlled   Past Surgical History:  Procedure Laterality Date  . ABDOMINAL HYSTERECTOMY    . BACK SURGERY    . BUBBLE STUDY  03/14/2019   Procedure: BUBBLE STUDY;  Surgeon: [name], MD;  Location: [place];  Service: Cardiovascular;;  . TEE WITHOUT CARDIOVERSION N/A 03/14/2019   Procedure: TRANSESOPHAGEAL ECHOCARDIOGRAM (TEE);  Surgeon: [name]  Belinda Cheng, MD;  Location: [facility];  Service: Cardiovascular;  Laterality: N/A;     Current Outpatient Medications  Medication Sig Dispense Refill  . amLODipine (NORVASC) 10 MG tablet Take 1 tablet (10 mg total) by mouth daily. 90 tablet 0  . Artificial Tear Ointment (DRY EYES OP) Place 1 drop into both eyes daily as needed (dry eyes).     Marland Kitchen aspirin 81 MG chewable tablet Chew 1 tablet (81 mg total) by mouth daily. 90 tablet 0  . atorvastatin (LIPITOR) 20 MG tablet Take 1 tablet (20 mg total) by mouth daily. 30 tablet 3  . Blood Glucose Monitoring Suppl (ONETOUCH VERIO) w/Device KIT Use 3 times daily before meals. 1 kit 0  . glucose blood test strip Use as instructed 3 times daily before meals 100 each 12  . labetalol (NORMODYNE) 100 MG tablet Take 1 tablet (100 mg total) by mouth 3 (three) times daily. 270 tablet 0  . Lancets (ONETOUCH ULTRASOFT) lancets Use as instructed 3 times daily before meals 100 each 12  . losartan (COZAAR) 50 MG tablet Take 1 tablet (50 mg total) by mouth 2 (two) times daily. 180 tablet 0  . metFORMIN (GLUCOPHAGE) 500 MG tablet Take 1 tablet (500 mg total) by mouth 2 (two) times daily with a meal. 180 tablet 0   No current facility-administered medications for this visit.    Allergies:   Codeine   Social History:  The patient  reports that she has quit smoking. She has never used smokeless tobacco. She reports current alcohol use of about 6.0 standard drinks  of alcohol per week. She reports that she does not use drugs.   Family History:  The patient's family history includes Colon cancer in her paternal grandmother; Diabetes in her father; Heart attack in her maternal grandmother and paternal grandfather; Heart failure in her maternal grandmother; Hypertension in her father and maternal grandmother; Pancreatic cancer in her sister.    ROS:  Please see the history of present illness.   Otherwise, review of systems is positive for none.   All other systems are  reviewed and negative.    PHYSICAL EXAM: VS:  BP 106/70   Pulse 82   Ht 5' 7.5" (1.715 m)   Wt 165 lb 9.6 oz (75.1 kg)   SpO2 96%   BMI 25.55 kg/m  , BMI Body mass index is 25.55 kg/m. GEN: Well nourished, well developed, in no acute distress  HEENT: normal  Neck: no JVD, carotid bruits, or masses Cardiac: RRR; no murmurs, rubs, or gallops,no edema  Respiratory:  clear to auscultation bilaterally, normal work of breathing GI: soft, nontender, nondistended, + BS MS: no deformity or atrophy  Skin: warm and dry Neuro:  Strength and sensation are intact Psych: euthymic mood, full affect  EKG:  EKG is not ordered today. Personal review of the ekg ordered 03/11/19 shows sinus rhythm  Recent Labs: 03/13/2019: Magnesium 1.7 03/15/2019: Hemoglobin 15.2; Platelets 199 04/20/2019: ALT 32; BUN 13; Creatinine, Ser 0.97; Potassium 4.8; Sodium 139    Lipid Panel     Component Value Date/Time   CHOL 191 03/06/2019 0445   TRIG 200 (H) 03/06/2019 0445   HDL 45 03/06/2019 0445   CHOLHDL 4.2 03/06/2019 0445   VLDL 40 03/06/2019 0445   LDLCALC 106 (H) 03/06/2019 0445     Wt Readings from Last 3 Encounters:  06/20/19 165 lb 9.6 oz (75.1 kg)  05/12/19 173 lb (78.5 kg)  04/20/19 181 lb 9.6 oz (82.4 kg)      Other studies Reviewed: Additional studies/ records that were reviewed today include: TTE 03/06/19  Review of the above records today demonstrates:  1. Left ventricular ejection fraction, by visual estimation, is 55 to  60%. The left ventricle has normal function. There is mildly increased  left ventricular hypertrophy.  2. Elevated left ventricular end-diastolic pressure.  3. Left ventricular diastolic parameters are consistent with Grade I  diastolic dysfunction (impaired relaxation).  4. Global right ventricle has normal systolic function.The right  ventricular size is normal. No increase in right ventricular wall  thickness.  5. Left atrial size was normal.  6.  Right atrial size was normal.  7. The mitral valve is grossly normal. Trace mitral valve regurgitation.  8. The tricuspid valve is grossly normal. Tricuspid valve regurgitation  is trivial.  9. The aortic valve is tricuspid. Aortic valve regurgitation is not  visualized. No evidence of aortic valve sclerosis or stenosis.  10. The pulmonic valve was grossly normal. Pulmonic valve regurgitation is  not visualized.  11. The inferior vena cava is normal in size with greater than 50%  respiratory variability, suggesting right atrial pressure of 3 mmHg.    ASSESSMENT AND PLAN:  1.  Cryptogenic stroke: No causes yet to be found.  This could be due to atrial fibrillation.  I reached out to her neurologist to see if Linq monitor implant is recommended.  If it is recommended, Lavita Pontius plan for Linq monitor insertion.  Risks and benefits were discussed which include bleeding, infection.  She understands these risks and has agreed to  the procedure.  2.  Hypertension: Blood pressure currently well controlled.  3.  Hyperlipidemia: Continue atorvastatin.    Current medicines are reviewed at length with the patient today.   The patient does not have concerns regarding her medicines.  The following changes were made today:  none  Labs/ tests ordered today include:  No orders of the defined types were placed in this encounter.    Disposition:   FU with Ndea Kilroy pending discussion with neurology  Signed, Randle Shatzer Meredith Leeds, MD  06/20/2019 11:31 AM     [facility] [address] [place] [address] [phone] (office) [phone] (fax)

## 2019-06-21 ENCOUNTER — Ambulatory Visit: Payer: Managed Care, Other (non HMO) | Admitting: Cardiovascular Disease

## 2019-06-23 ENCOUNTER — Telehealth: Payer: Self-pay

## 2019-06-23 NOTE — Patient Instructions (Addendum)
A1c is 4.7. This is not even in the pre-diabetic range. I would recommend stopping the second dose of Metformin and only taking it once in the morning. Great job working on [employer]!!     Thank you for choosing Primary Care at [facility] to be your medical home!    Faustino Congress Tokar was seen by Melina Schools, DO today.   Faustino Congress Lalanne's primary care provider is Phill Myron, DO.   For the best care possible, you should try to see Phill Myron, DO whenever you come to the clinic.   We look forward to seeing you again soon!  If you have any questions about your visit today, please call us at [phone] or feel free to reach your primary care provider via [place].

## 2019-06-23 NOTE — Telephone Encounter (Signed)
Called patient to do their pre-visit COVID screening.  Call went to voicemail. Unable to do prescreening.  

## 2019-06-24 ENCOUNTER — Ambulatory Visit (INDEPENDENT_AMBULATORY_CARE_PROVIDER_SITE_OTHER): Payer: Managed Care, Other (non HMO) | Admitting: Internal Medicine

## 2019-06-24 ENCOUNTER — Encounter: Payer: Self-pay | Admitting: Internal Medicine

## 2019-06-24 ENCOUNTER — Other Ambulatory Visit: Payer: Self-pay

## 2019-06-24 VITALS — BP 144/91 | HR 92 | Temp 98.1°F | Resp 17 | Ht 67.5 in | Wt 165.0 lb

## 2019-06-24 DIAGNOSIS — E782 Mixed hyperlipidemia: Secondary | ICD-10-CM

## 2019-06-24 DIAGNOSIS — Z1231 Encounter for screening mammogram for malignant neoplasm of breast: Secondary | ICD-10-CM

## 2019-06-24 DIAGNOSIS — E1159 Type 2 diabetes mellitus with other circulatory complications: Secondary | ICD-10-CM | POA: Diagnosis not present

## 2019-06-24 DIAGNOSIS — I10 Essential (primary) hypertension: Secondary | ICD-10-CM

## 2019-06-24 DIAGNOSIS — K5900 Constipation, unspecified: Secondary | ICD-10-CM

## 2019-06-24 DIAGNOSIS — Z1211 Encounter for screening for malignant neoplasm of colon: Secondary | ICD-10-CM

## 2019-06-24 LAB — POCT GLYCOSYLATED HEMOGLOBIN (HGB A1C): Hemoglobin A1C: 4.7 % (ref 4.0–5.6)

## 2019-06-24 LAB — GLUCOSE, POCT (MANUAL RESULT ENTRY): POC Glucose: 85 mg/dl (ref 70–99)

## 2019-06-24 NOTE — Progress Notes (Signed)
Subjective:    Belinda Banks - 64 y.o. female MRN [id number]  Date of birth: 1955-10-27  HPI  Belinda Banks is here for follow up of chronic medical conditions.  Diabetes mellitus, Type 2 Disease Monitoring             Blood Sugar Ranges: Fasting - 80-120s (avg around 100)              Polyuria: no              Visual problems: chronic problems related to prior stroke    Urine Microalbumin Pending today, On ARB therapy.   Last A1C: 7.6 (Nov 2020)   Medications: Metformin 500 mg BID  Medication Compliance: yes  Medication Side Effects             Hypoglycemia: no   Preventitive Health Care             Eye Exam: Patient is followed by [facility]. Does not need referral.              Foot Exam: Done today   Chronic HTN Disease Monitoring:  Home BP Monitoring - 100-110/60-80  Chest pain- no  Dyspnea- no Headache - no  Medications: Losartan 50 mg BID, Labetalol 100 mg TID, Amlodipine 10 mg   Compliance- yes Lightheadedness- no  Edema- no  Did not take BP medications       Health Maintenance:  Health Maintenance Due  Topic Date Due  . PNEUMOCOCCAL POLYSACCHARIDE VACCINE AGE 2-64 HIGH RISK  08/21/1958  . OPHTHALMOLOGY EXAM  08/21/1966  . COLONOSCOPY  08/21/2006  . MAMMOGRAM  09/25/2014    -  reports that she has quit smoking. She has never used smokeless tobacco. - Review of Systems: Per HPI. - Past Medical History: Patient Active Problem List   Diagnosis Date Noted  . CVA (cerebral vascular accident) ([place]) 03/11/2019  . Hypertensive emergency 03/09/2019  . Hyperlipidemia 03/09/2019  . DM (diabetes mellitus) ([place]) 03/07/2019  . HTN (hypertension) 03/07/2019  . Alcohol use 03/07/2019  . ICH (intracerebral hemorrhage) ([facility]) - Hypertensive L Basal Ganglia ICH 03/05/2019   - Medications: reviewed and updated   Objective:   Physical Exam BP (!) 144/91   Pulse 92   Temp 98.1 F (36.7 C) (Temporal)   Resp 17   Ht 5' 7.5"  (1.715 m)   Wt 165 lb (74.8 kg)   SpO2 97%   BMI 25.46 kg/m  Physical Exam  Constitutional: She is oriented to person, place, and time and well-developed, well-nourished, and in no distress. No distress.  HENT:  Head: Normocephalic and atraumatic.  Eyes: Conjunctivae and EOM are normal.  Cardiovascular: Normal rate, regular rhythm and normal heart sounds.  No murmur heard. Pulmonary/Chest: Effort normal and breath sounds normal. No respiratory distress.  Musculoskeletal:        General: Normal range of motion.  Neurological: She is alert and oriented to person, place, and time.  Skin: Skin is warm and dry. She is not diaphoretic.  Psychiatric: Affect and judgment normal.           Assessment & Plan:   1. Type 2 diabetes mellitus with other circulatory complication, without long-term current use of insulin (HCC) Fasting CBG 85. A1c 4.7 today and fasting CBGs well within goal. Will decrease to Metformin 500 mg daily.  Counseled on Diabetic diet, my plate method, [id number] minutes of moderate intensity exercise/week Blood sugar logs with fasting goals of 80-120 mg/dl,  random of less than 180 and in the event of sugars less than 60 mg/dl or greater than 400 mg/dl encouraged to notify the clinic. Advised on the need for annual eye exams, annual foot exams, Pneumonia vaccine. - HgB A1c - Microalbumin/Creatinine Ratio, Urine - HM DIABETES FOOT EXAM - Glucose (CBG)  2. Essential hypertension BP not at goal in office but did not take any medications this morning as was fasting for labs. However, at home BPs very much within range. Continue current regimen.  Counseled on blood pressure goal of less than 130/80, low-sodium, DASH diet, medication compliance, 150 minutes of moderate intensity exercise per week. Discussed medication compliance, adverse effects. - Comprehensive metabolic panel  3. Mixed hyperlipidemia LDL 106 in Nov 2020. Continue statin.   4. Colon cancer screening -  Ambulatory referral to Gastroenterology  5. Breast cancer screening by mammogram - MM Digital Screening; Future  6. Constipation, unspecified constipation type Patient reports constipation with only one bowel movement per week on average. BM is soft and small when it occurs. She does believe she has two hemorrhoids as well that might be blocking stool passage. Uses Miralax prn but is unwilling to use daily as states it causes diarrhea. Discussed adequate fiber and hydration. Not aware of any offending medications. Will have GI evaluate further.  - Ambulatory referral to Gastroenterology      Phill Myron, D.O. 06/24/2019, 8:57 AM Primary [facility] at [facility]

## 2019-06-25 LAB — COMPREHENSIVE METABOLIC PANEL
ALT: 18 IU/L (ref 0–32)
AST: 21 IU/L (ref 0–40)
Albumin/Globulin Ratio: 1.6 (ref 1.2–2.2)
Albumin: 4.4 g/dL (ref 3.8–4.8)
Alkaline Phosphatase: 94 IU/L (ref 39–117)
BUN/Creatinine Ratio: 14 (ref 12–28)
BUN: 10 mg/dL (ref 8–27)
Bilirubin Total: 0.8 mg/dL (ref 0.0–1.2)
CO2: 24 mmol/L (ref 20–29)
Calcium: 10 mg/dL (ref 8.7–10.3)
Chloride: 104 mmol/L (ref 96–106)
Creatinine, Ser: 0.73 mg/dL (ref 0.57–1.00)
GFR calc Af Amer: 102 mL/min/{1.73_m2} (ref >59)
GFR calc non Af Amer: 89 mL/min/{1.73_m2} (ref >59)
Globulin, Total: 2.8 g/dL (ref 1.5–4.5)
Glucose: 117 mg/dL — ABNORMAL HIGH (ref 65–99)
Potassium: 4 mmol/L (ref 3.5–5.2)
Sodium: 144 mmol/L (ref 134–144)
Total Protein: 7.2 g/dL (ref 6.0–8.5)

## 2019-06-26 LAB — MICROALBUMIN / CREATININE URINE RATIO
Creatinine, Urine: 137.9 mg/dL
Microalb/Creat Ratio: 9 mg/g creat (ref 0–29)
Microalbumin, Urine: 12.4 ug/mL

## 2019-06-27 NOTE — Progress Notes (Signed)
Patient notified of results & recommendations. Expressed understanding.

## 2019-07-08 ENCOUNTER — Encounter: Payer: Self-pay | Admitting: Gastroenterology

## 2019-07-14 ENCOUNTER — Encounter: Payer: Self-pay | Admitting: Adult Health

## 2019-07-14 ENCOUNTER — Other Ambulatory Visit: Payer: Self-pay

## 2019-07-14 ENCOUNTER — Ambulatory Visit: Payer: Managed Care, Other (non HMO) | Admitting: Adult Health

## 2019-07-14 VITALS — BP 111/74 | HR 66 | Temp 96.9°F | Ht 68.0 in | Wt 165.4 lb

## 2019-07-14 DIAGNOSIS — I610 Nontraumatic intracerebral hemorrhage in hemisphere, subcortical: Secondary | ICD-10-CM

## 2019-07-14 DIAGNOSIS — I10 Essential (primary) hypertension: Secondary | ICD-10-CM

## 2019-07-14 DIAGNOSIS — I69398 Other sequelae of cerebral infarction: Secondary | ICD-10-CM

## 2019-07-14 DIAGNOSIS — I639 Cerebral infarction, unspecified: Secondary | ICD-10-CM

## 2019-07-14 DIAGNOSIS — R41842 Visuospatial deficit: Secondary | ICD-10-CM

## 2019-07-14 DIAGNOSIS — E119 Type 2 diabetes mellitus without complications: Secondary | ICD-10-CM

## 2019-07-14 DIAGNOSIS — E785 Hyperlipidemia, unspecified: Secondary | ICD-10-CM

## 2019-07-14 DIAGNOSIS — H539 Unspecified visual disturbance: Secondary | ICD-10-CM

## 2019-07-14 NOTE — Patient Instructions (Signed)
Obtain prism glasses and keep [place] updated on how you are doing.  Once you are able to trial the glasses, we will go from there in regards to disability  Continue to follow with ophthalmology as scheduled  Continue aspirin 81 mg daily  and atorvastatin for secondary stroke prevention  Continue to follow up with PCP regarding cholesterol, blood pressure and diabetes management   Continue to monitor blood pressure at home  Maintain strict control of hypertension with blood pressure goal below 130/90, diabetes with hemoglobin A1c goal below 6.5% and cholesterol with LDL cholesterol (bad cholesterol) goal below 70 mg/dL. I also advised the patient to eat a healthy diet with plenty of whole grains, cereals, fruits and vegetables, exercise regularly and maintain ideal body weight.  Followup in the future with me in 3 months or call earlier if needed       Thank you for coming to see [place] at [facility]. I hope we have been able to provide you high quality care today.  You may receive a patient satisfaction survey over the next few weeks. We would appreciate your feedback and comments so that we may continue to improve ourselves and the health of our patients.

## 2019-07-14 NOTE — Progress Notes (Signed)
[facility] [address] [place]. [place] [address] [phone]       STROKE FOLLOW UP NOTE  [name] Date of Birth:  11-27-1955 Medical Record Number:  [id number]   Reason for Referral:  stroke follow up    CHIEF COMPLAINT:  Chief Complaint  Patient presents with  . Follow-up    TXRm. with Husband. 28mof/u. Doing well no concerns.    HPI:   Belinda Banks a 63year old female who is being seen today, 07/14/2019, for stroke follow-up.  She continues to have residual visual difficulties and was evaluated by ophthalmology on 06/03/2019 which showed bilateral inferior altitudinal visual field defects as well as bilateral homonymous inferior quadrantanopia.  [facility].  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  Hallucinations likely release hallucinations as they appeared on her blind areas - have improved since prior visit.  She does plan on follow-up with her life specialist in 3 months.  She does report mild improvement of vision but continues to have difficulty with reading and remembering numbers.  She also complains of short-term memory deficit.  She has completed outpatient therapy but continues to do exercises at home.  She continues on short-term disability due to ongoing visual deficits. She did have follow-up with cardiology who recommended placement of loop recorder due to cryptogenic stroke etiology based on further recommendations at today's visit.  Continues on aspirin 81 mg daily without bleeding or bruising.  Repeat LFTs normalized and recommended initiating atorvastatin 20 mg daily.  Recent repeat LFT by PCP remains WNL.  Blood pressure today 111/74.  No further concerns at this time.     History: (Provided for reference purposes only) Stroke  admission 03/05/2019: BelindaVerle H McBrayeris a 65y.o.femalewith history of HTN(no recent medical f/u) who [facility] with c/o slurred speech, right sided weakness, facial droop, BP 230/158 and glucose 238. CT head showed small ICH centered at the left lentiform nucleus estimated volume 3.8 cc felt secondary to hypertensive.  CTA head/neck unchanged size of hypertensive hemorrhage in left basal ganglia without evidence of aneurysm or AVM.  2D echo normal EF without cardiac source of embolus identified.  COVID-19 negative.  Hypertensive emergency on arrival treated with Cleviprex with increasing Cozaar, initiating labetalol and continuation of Norvasc.   Alcohol abuse 4-6 drinks per day placed on B1/FA/MVI and advised to drink no more than one alcoholic beverage per day.  New diagnosis of uncontrolled DM with A1c 7.6 and initiated Metformin 500 mg twice daily and advised to follow-up with PCP outpatient.  LDL 106 and initiated atorvastatin 20 mg daily.  Other stroke risk factors include former tobacco use and sedentary lifestyle.  Residual deficits of right facial droop, right pronator drift with decreased right hand dexterity and discharged home in stable condition with outpatient therapies.  Stroke admission 03/11/2019: She returned on 03/11/2019 with complaints of generalized weakness, nausea and slight confusion.  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  MRI w/wo contrast showed multiple acute to early subacute cerebral infarcts bilaterally largest in posterior left MCA region, stable appearance of subacute hemorrhage, and moderate chronic small vessel ischemic disease with multiple chronic lacunar infarcts.  CTA head and neck and 2D  echo not repeated as performed on prior admission.  Underwent TEE and was negative for vegetations,  embolus or PFO.  Discontinued atorvastatin due to elevated LFTs.  Evaluated by Dr. Leonie Man who felt initial infarct was hemorrhagic rather than primary intracerebral hemorrhage.  Recommended initiating aspirin 81 mg daily for stroke prevention.  Felt to be not a candidate for long-term anticoagulation at that time but may consider loop recorder placement in the future for further embolic work-up.  Vasculitis work-up pending at discharge.  Residual deficits of right peripheral partial field loss, mild right lower facial asymmetry, diminished fine finger movements on the right, mild weakness of right grip and mild right hip flexor and ankle dorsiflexion weakness.  Discharged home with recommendation of outpatient therapy.  Initial visit 04/20/2019 JM: Belinda Banks is a 64 year old female who is being seen today for hospital follow-up accompanied by her husband.  Residual deficits of visual impairment, cognitive impairment, aphasia and decreased right hand dexterity.  She has difficulty fully explaining visual impairment as she denies diplopia or blurred vision but describes it as distortion of images and words which is worsened if she is concentrating for prolonged period of time.  Vision will improve after closing and resting eyes.  She also endorses occasional visual hallucinations.  She has not had evaluation by ophthalmology at this time and is requesting evaluation by neuro-ophthalmology as recommended by therapy.  She continues to work with outpatient speech and OT with ongoing improvement.  She has not been able to return to work due to [employer] of job requires computer and paperwork.  She is currently receiving short-term disability from hospitalization and is requesting continuation of short-term disability through our office.  She has not established care with PCP at this time but does have initial appointment in approximately 1 month.  She also endorses occasional progressive symptoms of left hand numbness  slowly progressing up arm and then will begin tremoring lasting 30 seconds to 1 minute.  Usually occurs after activity especially after shower.  She has continued on aspirin 81 mg daily without bleeding or bruising.  She has not had repeat LFTs as she does not have established PCP at this time therefore currently not on statin therapy.  Blood pressure today satisfactory at 117/79.  Review of vasculitis work-up all unremarkable.  She reports decreasing EtOH use and has only had approximately 3 drinks since discharge.  No further concerns at this time.       ROS:   14 system review of systems performed and negative with exception of visual impairment and short-term memory deficit  PMH:  Past Medical History:  Diagnosis Date  . Diabetes mellitus without complication ([place])    type 2  . Hypertension    controlled    PSH:  Past Surgical History:  Procedure Laterality Date  . ABDOMINAL HYSTERECTOMY    . BACK SURGERY    . BUBBLE STUDY  03/14/2019   Procedure: BUBBLE STUDY;  Surgeon: [name], MD;  Location: [place];  Service: Cardiovascular;;  . TEE WITHOUT CARDIOVERSION N/A 03/14/2019   Procedure: TRANSESOPHAGEAL ECHOCARDIOGRAM (TEE);  Surgeon: [name] [name], MD;  Location: [facility] ENDOSCOPY;  Service: Cardiovascular;  Laterality: N/A;    Social History:  Social History   Socioeconomic History  . Marital status: Married    Spouse name: Not on file  . Number of children: Not on file  . Years of education: Not on file  . Highest education level: Not on file  Occupational History  .  Not on file  Tobacco Use  . Smoking status: Former Research scientist (life sciences)  . Smokeless tobacco: Never Used  Substance and Sexual Activity  . Alcohol use: Yes    Alcohol/week: 6.0 standard drinks    Types: 3 Glasses of wine, 3 Standard drinks or equivalent per week    Comment: 5-6 drinks a day   . Drug use: Never  . Sexual activity: Not on file  Other Topics Concern  . Not on file  Social History  Narrative  . Not on file   Social Determinants of Health   Financial Resource Strain:   . Difficulty of Paying Living Expenses:   Food Insecurity:   . Worried About Charity fundraiser in the Last Year:   . Arboriculturist in the Last Year:   Transportation Needs:   . Film/video editor (Medical):   Marland Kitchen Lack of Transportation (Non-Medical):   Physical Activity:   . Days of Exercise per Week:   . Minutes of Exercise per Session:   Stress:   . Feeling of Stress :   Social Connections:   . Frequency of Communication with Friends and Family:   . Frequency of Social Gatherings with Friends and Family:   . Attends Religious Services:   . Active Member of Clubs or Organizations:   . Attends Archivist Meetings:   Marland Kitchen Marital Status:   Intimate Partner Violence:   . Fear of Current or Ex-Partner:   . Emotionally Abused:   Marland Kitchen Physically Abused:   . Sexually Abused:     Family History:  Family History  Problem Relation Age of Onset  . Diabetes Father   . Hypertension Father   . Pancreatic cancer Sister   . Heart attack Maternal Grandmother   . Heart failure Maternal Grandmother   . Hypertension Maternal Grandmother   . Colon cancer Paternal Grandmother   . Heart attack Paternal Grandfather     Medications:   Current Outpatient Medications on File Prior to Visit  Medication Sig Dispense Refill  . amLODipine (NORVASC) 10 MG tablet Take 1 tablet (10 mg total) by mouth daily. 90 tablet 0  . Artificial Tear Ointment (DRY EYES OP) Place 1 drop into both eyes daily as needed (dry eyes).     Marland Kitchen aspirin 81 MG chewable tablet Chew 1 tablet (81 mg total) by mouth daily. 90 tablet 0  . atorvastatin (LIPITOR) 20 MG tablet Take 1 tablet (20 mg total) by mouth daily. 30 tablet 3  . Blood Glucose Monitoring Suppl (ONETOUCH VERIO) w/Device KIT Use 3 times daily before meals. 1 kit 0  . glucose blood test strip Use as instructed 3 times daily before meals 100 each 12  . labetalol  (NORMODYNE) 100 MG tablet Take 1 tablet (100 mg total) by mouth 3 (three) times daily. 270 tablet 0  . Lancets (ONETOUCH ULTRASOFT) lancets Use as instructed 3 times daily before meals 100 each 12  . losartan (COZAAR) 50 MG tablet Take 1 tablet (50 mg total) by mouth 2 (two) times daily. 180 tablet 0  . metFORMIN (GLUCOPHAGE) 500 MG tablet Take 1 tablet (500 mg total) by mouth 2 (two) times daily with a meal. (Patient taking differently: Take 500 mg by mouth daily. ) 180 tablet 0   No current facility-administered medications on file prior to visit.    Allergies:   Allergies  Allergen Reactions  . Codeine Other (See Comments)    Made her loopy     Physical  Exam  Vitals:   07/14/19 1235  BP: 111/74  Pulse: 66  Temp: (!) 96.9 F (36.1 C)  Weight: 165 lb 6.4 oz (75 kg)  Height: '5\' 8"'$  (1.727 m)   Body mass index is 25.15 kg/m. No exam data present   General: well developed, well nourished,  pleasant middle-age Caucasian female, seated, in no evident distress Head: head normocephalic and atraumatic.   Neck: supple with no carotid or supraclavicular bruits Cardiovascular: regular rate and rhythm, no murmurs Musculoskeletal: no deformity Skin:  no rash/petichiae Vascular:  Normal pulses all extremities   Neurologic Exam Mental Status: Awake and fully alert. Normal speech and language.  Oriented to place and time. Recent memory subjectively diminished and remote memory intact. Attention span, concentration and fund of knowledge appropriate. Mood and affect appropriate.  Cranial Nerves: Pupils equal, briskly reactive to light. Extraocular movements full.  mild left eye convergence insufficiency.  Visual fields full to confrontation during basic office exam.  Hearing intact. Facial sensation intact. Face, tongue, palate moves normally and symmetrically.  Motor: Normal bulk and tone.  Full strength in all 6 extremities.  Dexterity.  Full strength left upper and lower  extremity Sensory.: intact to touch , pinprick , position and vibratory sensation.  Coordination: Rapid alternating movements normal in all extremities.  Finger-to-nose and heel-to-shin performed accurately bilaterally.   Gait and Station: Arises from chair without difficulty. Stance is normal. Gait demonstrates normal stride length and balance Reflexes: 1+ and symmetric. Toes downgoing.         ASSESSMENT: AUDRENA TALAGA is a 64 y.o. year old female with recent admission on 03/05/2019 for left basal ganglia hemorrhage felt at that time to be hypertensive in etiology and then returned on 03/11/2019 with generalized weakness, nausea and slight confusion and found to have multiple additional acute and subacute bilateral cerebral infarcts embolic pattern secondary to unknown source.  Septic emboli and vasculitis ruled out.  Not a candidate at that time to pursue further embolic work-up due to [place].  Vascular risk factors include HTN, HLD, DM, history of EtOH use, new diagnosis DM.  Residual deficits of bilateral homonymous inferior quadrantanopia and visual spatial deficit    PLAN:  1. Multiple strokes:  a. Continue aspirin 81 mg daily and atorvastatin 20 mg daily for secondary stroke prevention.  b. Recommend pursuing loop recorder placement at this time due to cryptogenic stroke etiology -will discuss further with cardiology c. Visual loss: Continue to follow with ophthalmology; ongoing improvement but continues to have reading difficulties.  Recommend trial of prism glasses once obtained.  If no improvement with prism glasses, will likely need to pursue LTD.  Will extend STD until 08/2019 d. Maintain strict control of hypertension with blood pressure goal below 130/90, diabetes with hemoglobin A1c goal below 6.5% and cholesterol with LDL cholesterol (bad cholesterol) goal below 70 mg/dL.  I also advised the patient to eat a healthy diet with plenty of whole grains, cereals, fruits and  vegetables, exercise regularly with at least 30 minutes of continuous activity daily and maintain ideal body weight. 2. HTN: Stable 3. HLD: Continue atorvastatin and surveillance monitoring of lipid panel and LFTs by PCP 4. DMII: Stable   Follow up in 3 months or call earlier if needed   I spent 33 minutes of face-to-face and non-face-to-face time with patient.  This included previsit chart review, lab review, study review, order entry, electronic health record documentation, patient education    Frann Rider, AGNP-BC  [facility]  [address] [place], [place] [address]  Phone [phone] Fax [phone] Note: This document was prepared with digital dictation and possible smart phrase technology. Any transcriptional errors that result from this process are unintentional.

## 2019-07-21 ENCOUNTER — Encounter: Payer: Self-pay | Admitting: Gastroenterology

## 2019-07-22 NOTE — Progress Notes (Signed)
I agree with the above plan 

## 2019-07-28 DIAGNOSIS — Z0289 Encounter for other administrative examinations: Secondary | ICD-10-CM

## 2019-08-04 ENCOUNTER — Telehealth: Payer: Self-pay | Admitting: *Deleted

## 2019-08-04 NOTE — Telephone Encounter (Signed)
Completed signed to MR.

## 2019-08-04 NOTE — Telephone Encounter (Signed)
STD paperwork faxed to [employer]

## 2019-08-08 NOTE — Telephone Encounter (Signed)
Re faxed on 08/08/19 to [employer]

## 2019-08-10 ENCOUNTER — Telehealth: Payer: Self-pay | Admitting: Cardiology

## 2019-08-10 ENCOUNTER — Encounter: Payer: Managed Care, Other (non HMO) | Admitting: Gastroenterology

## 2019-08-10 NOTE — Telephone Encounter (Signed)
Ms. Pickles reports she got a letter from her insurance approving a loop if Dr. Curt Bears thinks she needs it. Reiterated to her that per her last note, Dr. Curt Bears was going to speak with Neuro and proceed if needed.  She requests a call back from Dr. Macky Lower nurse for an update.  Per 3/25 Neuro note: a. Recommend pursuing loop recorder placement at this time due to cryptogenic stroke etiology -will discuss further with cardiology

## 2019-08-10 NOTE — Telephone Encounter (Signed)
New Message   Patient is calling because at her appt it was discussed a possible loop recorder. She states that she has heard back from her insurance and she would like to know the next step. Please call to discuss.

## 2019-08-12 NOTE — Telephone Encounter (Signed)
Pt scheduled for 5/25 for ILR implant in-office.

## 2019-08-12 NOTE — Telephone Encounter (Signed)
-----   Message from Benancio Deeds sent at 08/09/2019 10:09 AM EDT ----- Regarding: RE: possible ILR implant in office Finally heard back with auth # [id number] exp 10/24/19.  Please let me know when schedule.  Thank you so much,  Caryl Pina ----- Message ----- From: Stanton Kidney, RN Sent: 08/05/2019   3:20 PM EDT To: Benancio Deeds Subject: FW: possible ILR implant in office             Have we heard back from insurance on this pt? ----- Message ----- From: Stanton Kidney, RN Sent: 07/20/2019   8:18 AM EDT To: Stanton Kidney, RN, # Subject: possible ILR implant in office                 C stroke  Neuro wants her seen for possible ILR implant w/ [facility]  Please precert for in-office implant and let me know outcome (waiting to schedule consult once received approval/denial).   Thx  Deng Kemler

## 2019-08-15 NOTE — Progress Notes (Signed)
[name], Greatly appreciate your follow-up and update! Belinda Banks

## 2019-08-29 ENCOUNTER — Other Ambulatory Visit: Payer: Self-pay

## 2019-08-29 MED ORDER — LOSARTAN POTASSIUM 50 MG PO TABS: 50.0000 mg | ORAL_TABLET | Freq: Two times a day (BID) | ORAL | 0 refills | Status: DC

## 2019-08-29 MED ORDER — METFORMIN HCL 500 MG PO TABS: 500.0000 mg | ORAL_TABLET | Freq: Two times a day (BID) | ORAL | 0 refills | Status: DC

## 2019-08-29 MED ORDER — LABETALOL HCL 100 MG PO TABS: 100.0000 mg | ORAL_TABLET | Freq: Three times a day (TID) | ORAL | 0 refills | Status: DC

## 2019-08-29 MED ORDER — ASPIRIN 81 MG PO CHEW: 81.0000 mg | CHEWABLE_TABLET | Freq: Every day | ORAL | 0 refills | Status: DC

## 2019-08-29 MED ORDER — AMLODIPINE BESYLATE 10 MG PO TABS: 10.0000 mg | ORAL_TABLET | Freq: Every day | ORAL | 0 refills | Status: DC

## 2019-08-31 ENCOUNTER — Other Ambulatory Visit: Payer: Self-pay

## 2019-08-31 ENCOUNTER — Ambulatory Visit: Payer: Managed Care, Other (non HMO) | Admitting: Gastroenterology

## 2019-08-31 ENCOUNTER — Encounter: Payer: Self-pay | Admitting: Gastroenterology

## 2019-08-31 ENCOUNTER — Telehealth: Payer: Self-pay | Admitting: Gastroenterology

## 2019-08-31 VITALS — BP 104/64 | HR 80 | Temp 97.2°F | Ht 67.5 in | Wt 161.1 lb

## 2019-08-31 DIAGNOSIS — R194 Change in bowel habit: Secondary | ICD-10-CM | POA: Diagnosis not present

## 2019-08-31 MED ORDER — NA SULFATE-K SULFATE-MG SULF 17.5-3.13-1.6 GM/177ML PO SOLN
1.0000 | Freq: Once | ORAL | 0 refills | Status: AC
Start: 2019-08-31 — End: 2019-08-31

## 2019-08-31 NOTE — Telephone Encounter (Signed)
Spoke with pt and she is aware.

## 2019-08-31 NOTE — Patient Instructions (Addendum)
If you are age 63 or older, your body mass index should be between 23-30. Your Body mass index is 24.86 kg/m. If this is out of the aforementioned range listed, please consider follow up with your Primary Care Provider.  If you are age 53 or younger, your body mass index should be between 19-25. Your Body mass index is 24.86 kg/m. If this is out of the aformentioned range listed, please consider follow up with your Primary Care Provider.   I have recommended a colonoscopy.  I recommend using a daily stool bulking agent such as Benefiber to even out your bowel habits.  Tips for colonoscopy:  - Stay well hydrated for 3-4 days prior to the exam. This reduces nausea and dehydration.  - To prevent skin/hemorrhoid irritation - prior to wiping, put A&Dointment or vaseline on the toilet paper. - Keep a towel or pad on the bed.  - Drink  64oz of clear liquids in the morning of prep day (prior to starting the prep) to be sure that there is enough fluid to flush the colon and stay hydrated!!!! This is in addition to the fluids required for preparation. - Use of a flavored hard candy, such as grape Anise Salvo, can counteract some of the flavor of the prep and may prevent some nausea.   Thank you for trusting me with your gastrointestinal care!    Thornton Park, MD, MPH

## 2019-08-31 NOTE — Telephone Encounter (Signed)
Okay to proceed with endoscopy in the setting of an implantable loop recorder. Thanks.

## 2019-08-31 NOTE — Telephone Encounter (Signed)
Pt is having an implanted loop recorder placed 09/13/19. Pt wants to know if she will still be ok to have the colon as scheduled on 6/17. Please advise.

## 2019-08-31 NOTE — Progress Notes (Signed)
Referring Provider: Caryl Never* Primary Care Physician:  Nicolette Bang, DO  Reason for Consultation: Colon cancer screening   IMPRESSION:  Need for colon cancer screening Rectal fullness following CVA Mild change in bowel habits after CVA with constipation, sense of incomplete evacuation Hemorrhoid Family history of colon polyps (father) Family history of pancreatic cancer (sister at age 63) Family history of colon cancer (paternal grandmother at age 68)  Need for colon cancer screening: Family history of colon polyps.  Colonoscopy recommended.   Change in bowel habits: Temporally associated with her stroke. May be exacerbated by medications. Recommend adding a daily stool bulking agent such as Benefiber.  Rectal fullness developing after CVA: Given the associated sense of incomplete evacuation, full evaluation is indicated.  We will plan rectal examination and retroflexed views of the rectum at time of upcoming colonoscopy.   PLAN: Add a daily stool bulking agent such Benefiber  Obtain prior colonoscopy report from [facility] with colonoscopy  Please see the "Patient Instructions" section for addition details about the plan.  HPI: Belinda Banks is a 63 y.o. female referred by Dr. Juleen China for colon cancer screening. The history is obtained through the patient, her husband who accompanies her to this appointment, and review of her electronic health record. She has diabetes, hypertension, hyperlipidemia and a stroke stroke 03/05/2019 with residual visual difficulties followed by ophthalmology and some short-term memory difficulties. Echo 03/14/2019 showed an EF of 60 to 65%. She completed her second Covid vaccine 2-3 weeks ago.   She presents today requesting a screening colonoscopy. Notes severe constipation following her stroke, now with soft bowel movements. Rare diarrhea. Sense of incomplete evacuation with defecation.  Sense of rectal fullness that  she thinks is related to hemorrhoids. No other ongoing GI symptoms. No blood or mucous. No rectal pain or bleeding. Tried Miralax after her stroke but didn't like the explosive, watery diarrhea that occurred.   She had a colonoscopy in 2008 with [facility].  Last labs from 02/2019 showed a hemoglobin of 15.2, MCV 98.5, RDW 11.4.  Paternal grandmother with colon cancer at age 19. Father with polyps. Sister died of pancreatic cancer at age 46.  No other known family history of colon cancer or polyps. No family history of uterine/endometrial cancer, pancreatic cancer or gastric/stomach cancer.   Past Medical History:  Diagnosis Date  . Diabetes mellitus without complication ([place])    type 2  . Hypertension    controlled    Past Surgical History:  Procedure Laterality Date  . ABDOMINAL HYSTERECTOMY    . BACK SURGERY    . BUBBLE STUDY  03/14/2019   Procedure: BUBBLE STUDY;  Surgeon: [name], MD;  Location: [facility];  Service: Cardiovascular;;  . TEE WITHOUT CARDIOVERSION N/A 03/14/2019   Procedure: TRANSESOPHAGEAL ECHOCARDIOGRAM (TEE);  Surgeon: [name] [name], MD;  Location: [facility] ENDOSCOPY;  Service: Cardiovascular;  Laterality: N/A;    Current Outpatient Medications  Medication Sig Dispense Refill  . amLODipine (NORVASC) 10 MG tablet Take 1 tablet (10 mg total) by mouth daily. 90 tablet 0  . Artificial Tear Ointment (DRY EYES OP) Place 1 drop into both eyes daily as needed (dry eyes).     Marland Kitchen aspirin 81 MG chewable tablet Chew 1 tablet (81 mg total) by mouth daily. 90 tablet 0  . atorvastatin (LIPITOR) 20 MG tablet Take 1 tablet (20 mg total) by mouth daily. 30 tablet 3  . Blood Glucose Monitoring Suppl (ONETOUCH VERIO) w/Device KIT Use 3  times daily before meals. 1 kit 0  . glucose blood test strip Use as instructed 3 times daily before meals 100 each 12  . labetalol (NORMODYNE) 100 MG tablet Take 1 tablet (100 mg total) by mouth 3 (three) times daily. 270 tablet 0  . Lancets  (ONETOUCH ULTRASOFT) lancets Use as instructed 3 times daily before meals 100 each 12  . losartan (COZAAR) 50 MG tablet Take 1 tablet (50 mg total) by mouth 2 (two) times daily. 180 tablet 0  . metFORMIN (GLUCOPHAGE) 500 MG tablet Take 1 tablet (500 mg total) by mouth 2 (two) times daily with a meal. 180 tablet 0   No current facility-administered medications for this visit.    Allergies as of 08/31/2019 - Review Complete 07/14/2019  Allergen Reaction Noted  . Codeine Other (See Comments) 03/11/2019    Family History  Problem Relation Age of Onset  . Diabetes Father   . Hypertension Father   . Pancreatic cancer Sister   . Heart attack Maternal Grandmother   . Heart failure Maternal Grandmother   . Hypertension Maternal Grandmother   . Colon cancer Paternal Grandmother   . Heart attack Paternal Grandfather     Social History   Socioeconomic History  . Marital status: Married    Spouse name: Not on file  . Number of children: Not on file  . Years of education: Not on file  . Highest education level: Not on file  Occupational History  . Not on file  Tobacco Use  . Smoking status: Former Research scientist (life sciences)  . Smokeless tobacco: Never Used  Substance and Sexual Activity  . Alcohol use: Yes    Alcohol/week: 6.0 standard drinks    Types: 3 Glasses of wine, 3 Standard drinks or equivalent per week    Comment: 5-6 drinks a day   . Drug use: Never  . Sexual activity: Not on file  Other Topics Concern  . Not on file  Social History Narrative  . Not on file   Social Determinants of Health   Financial Resource Strain:   . Difficulty of Paying Living Expenses:   Food Insecurity:   . Worried About Charity fundraiser in the Last Year:   . Arboriculturist in the Last Year:   Transportation Needs:   . Film/video editor (Medical):   Marland Kitchen Lack of Transportation (Non-Medical):   Physical Activity:   . Days of Exercise per Week:   . Minutes of Exercise per Session:   Stress:   .  Feeling of Stress :   Social Connections:   . Frequency of Communication with Friends and Family:   . Frequency of Social Gatherings with Friends and Family:   . Attends Religious Services:   . Active Member of Clubs or Organizations:   . Attends Archivist Meetings:   Marland Kitchen Marital Status:   Intimate Partner Violence:   . Fear of Current or Ex-Partner:   . Emotionally Abused:   Marland Kitchen Physically Abused:   . Sexually Abused:     Review of Systems: 12 system ROS is negative except as noted above.   Physical Exam: General:   Alert,  well-nourished, pleasant and cooperative in NAD Head:  Normocephalic and atraumatic. Eyes:  Sclera clear, no icterus.   Conjunctiva pink. Ears:  Normal auditory acuity. Nose:  No deformity, discharge,  or lesions. Mouth:  No deformity or lesions.   Neck:  Supple; no masses or thyromegaly. Lungs:  Clear throughout to auscultation.  No wheezes. Heart:  Regular rate and rhythm; no murmurs. Abdomen:  Soft, nontender, nondistended, normal bowel sounds, no rebound or guarding. No hepatosplenomegaly.   Rectal:  Deferred upcoming colonoscopy. Msk:  Symmetrical. No boney deformities LAD: No inguinal or umbilical LAD Extremities:  No clubbing or edema. Neurologic:  Alert and  oriented x4; slightly forgetful about recent events including her current medications: Grossly nonfocal Skin:  Intact without significant lesions or rashes. Psych:  Alert and cooperative. Normal mood and affect.   Lilly Gasser L. Tarri Glenn, MD, MPH 08/31/2019, 10:11 AM     on

## 2019-09-13 ENCOUNTER — Ambulatory Visit (INDEPENDENT_AMBULATORY_CARE_PROVIDER_SITE_OTHER): Payer: Managed Care, Other (non HMO) | Admitting: Cardiology

## 2019-09-13 ENCOUNTER — Other Ambulatory Visit: Payer: Self-pay

## 2019-09-13 ENCOUNTER — Encounter: Payer: Self-pay | Admitting: Cardiology

## 2019-09-13 VITALS — BP 122/76 | HR 73 | Ht 67.5 in | Wt 163.0 lb

## 2019-09-13 DIAGNOSIS — I639 Cerebral infarction, unspecified: Secondary | ICD-10-CM | POA: Diagnosis not present

## 2019-09-13 NOTE — Patient Instructions (Signed)
Medication Instructions:  Your physician recommends that you continue on your current medications as directed. Please refer to the Current Medication list given to you today.  *If you need a refill on your cardiac medications before your next appointment, please call your pharmacy*   Lab Work: None ordered   Testing/Procedures: None ordered   Follow-Up: At [facility], you and your health needs are our priority.  As part of our continuing mission to provide you with exceptional heart care, we have created designated Provider Care Teams.  These Care Teams include your primary Cardiologist (physician) and Advanced Practice Providers (APPs -  Physician Assistants and Nurse Practitioners) who all work together to provide you with the care you need, when you need it.  We recommend signing up for the patient portal called "MyChart".  Sign up information is provided on this After Visit Summary.  MyChart is used to connect with patients for Virtual Visits (Telemedicine).  Patients are able to view lab/test results, encounter notes, upcoming appointments, etc.  Non-urgent messages can be sent to your provider as well.   To learn more about what you can do with MyChart, go to [email or web address].    Your next appointment:   7-10 day(s)  The format for your next appointment:   In Person  Provider:   device clinic for a wound check   Thank you for choosing [facility]!!   Trinidad Curet, RN [phone]    Other Instructions   Implantable Loop Recorder Placement, Care After This sheet gives you information about how to care for yourself after your procedure. Your health care provider may also give you more specific instructions. If you have problems or questions, contact your health care provider. What can I expect after the procedure? After the procedure, it is common to have:  Soreness or discomfort near the incision.  Some swelling or bruising near the  incision. Follow these instructions at home: Incision care   Follow instructions from your health care provider about how to take care of your incision. Make sure you: ? Wash your hands with soap and water before you change your bandage (dressing). If soap and water are not available, use hand sanitizer. ? Change your dressing as told by your health care provider. ? Keep your dressing dry. ? Leave stitches (sutures), skin glue, or adhesive strips in place. These skin closures may need to stay in place for 2 weeks or longer. If adhesive strip edges start to loosen and curl up, you may trim the loose edges. Do not remove adhesive strips completely unless your health care provider tells you to do that.  Check your incision area every day for signs of infection. Check for: ? Redness, swelling, or pain. ? Fluid or blood. ? Warmth. ? Pus or a bad smell.  Do not take baths, swim, or use a hot tub until your health care provider approves. Ask your health care provider if you can take showers. Activity   Return to your normal activities as told by your health care provider. Ask your health care provider what activities are safe for you.  Do not drive for 24 hours if you were given a sedative during your procedure. General instructions  Follow instructions from your health care provider about how to manage your implantable loop recorder and transmit the information. Learn how to activate a recording if this is necessary for your type of device.  Do not go through a metal detection gate, and do not let someone  hold a metal detector over your chest. Show your ID card.  Do not have an MRI unless you check with your health care provider first.  Take over-the-counter and prescription medicines only as told by your health care provider.  Keep all follow-up visits as told by your health care provider. This is important. Contact a health care provider if:  You have redness, swelling, or pain  around your incision.  You have a fever.  You have pain that is not relieved by your pain medicine.  You have triggered your device because of fainting (syncope) or because of a heartbeat that feels like it is racing, slow, fluttering, or skipping (palpitations). Get help right away if you have:  Chest pain.  Difficulty breathing. Summary  After the procedure, it is common to have soreness or discomfort near the incision.  Change your dressing as told by your health care provider.  Follow instructions from your health care provider about how to manage your implantable loop recorder and transmit the information.  Keep all follow-up visits as told by your health care provider. This is important. This information is not intended to replace advice given to you by your health care provider. Make sure you discuss any questions you have with your health care provider. Document Revised: 05/23/2017 Document Reviewed: 05/23/2017 Elsevier Patient Education  2020 [employer].

## 2019-09-13 NOTE — Progress Notes (Signed)
Electrophysiology Office Note   Date:  09/13/2019   ID:  Belinda Banks, DOB 1955/04/25, MRN [id number]  PCP:  Nicolette Bang, DO  Cardiologist:   Primary Electrophysiologist:  Will Meredith Leeds, MD    Chief Complaint: CVA   History of Present Illness: Belinda Banks is a 63 y.o. female who is being seen today for the evaluation of CVA at the request of Caryl Never*. Presenting today for electrophysiology evaluation.  Has a history of diabetes, hypertension, hyperlipidemia.  She was hospitalized 03/05/2019 for intracranial hemorrhage and 03/11/2023 subacute bilateral cerebral infarcts.  Her blood pressure was quite high.  She had right-sided weakness and right facial droop with CT scan showing intracranial hemorrhage.  She was admitted a week later with lacunar infarcts after being found with confusion and weakness.  Today, denies symptoms of palpitations, chest pain, shortness of breath, orthopnea, PND, lower extremity edema, claudication, dizziness, presyncope, syncope, bleeding, or neurologic sequela. The patient is tolerating medications without difficulties.    Past Medical History:  Diagnosis Date  . Diabetes mellitus without complication ([place])    type 2  . Hypertension    controlled  . Stroke [facility])    x 2 02/2019   Past Surgical History:  Procedure Laterality Date  . ABDOMINAL HYSTERECTOMY    . BACK SURGERY    . BUBBLE STUDY  03/14/2019   Procedure: BUBBLE STUDY;  Surgeon: [name], MD;  Location: [facility] ENDOSCOPY;  Service: Cardiovascular;;  . COLONOSCOPY     approx 10 years at [place]  . TEE WITHOUT CARDIOVERSION N/A 03/14/2019   Procedure: TRANSESOPHAGEAL ECHOCARDIOGRAM (TEE);  Surgeon: [name] [name], MD;  Location: [facility] ENDOSCOPY;  Service: Cardiovascular;  Laterality: N/A;     Current Outpatient Medications  Medication Sig Dispense Refill  . amLODipine (NORVASC) 10 MG tablet Take 1 tablet (10 mg total) by mouth daily. (Patient  not taking: Reported on 08/31/2019) 90 tablet 0  . aspirin 81 MG chewable tablet Chew 1 tablet (81 mg total) by mouth daily. 90 tablet 0  . atorvastatin (LIPITOR) 20 MG tablet Take 1 tablet (20 mg total) by mouth daily. 30 tablet 3  . Blood Glucose Monitoring Suppl (ONETOUCH VERIO) w/Device KIT Use 3 times daily before meals. 1 kit 0  . glucose blood test strip Use as instructed 3 times daily before meals 100 each 12  . labetalol (NORMODYNE) 100 MG tablet Take 1 tablet (100 mg total) by mouth 3 (three) times daily. 270 tablet 0  . Lancets (ONETOUCH ULTRASOFT) lancets Use as instructed 3 times daily before meals 100 each 12  . losartan (COZAAR) 50 MG tablet Take 1 tablet (50 mg total) by mouth 2 (two) times daily. 180 tablet 0  . metFORMIN (GLUCOPHAGE) 500 MG tablet Take 1 tablet (500 mg total) by mouth 2 (two) times daily with a meal. (Patient taking differently: Take 500 mg by mouth daily. ) 180 tablet 0   No current facility-administered medications for this visit.    Allergies:   Codeine   Social History:  The patient  reports that she has quit smoking. She has never used smokeless tobacco. She reports current alcohol use of about 1.0 standard drinks of alcohol per week. She reports that she does not use drugs.   Family History:  The patient's family history includes Colon cancer in her paternal grandmother; Colon polyps in her father; Diabetes in her father; Heart attack in her maternal grandmother and paternal grandfather; Heart failure in  her maternal grandmother; Hypertension in her father, maternal grandmother, and mother; Pancreatic cancer in her sister.    ROS:  Please see the history of present illness.   Otherwise, review of systems is positive for none.   All other systems are reviewed and negative.   PHYSICAL EXAM: VS:  BP 122/76   Pulse 73   Ht 5' 7.5" (1.715 m)   Wt 163 lb (73.9 kg)   SpO2 97%   BMI 25.15 kg/m  , BMI Body mass index is 25.15 kg/m. GEN: Well nourished,  well developed, in no acute distress  HEENT: normal  Neck: no JVD, carotid bruits, or masses Cardiac: RRR; no murmurs, rubs, or gallops,no edema  Respiratory:  clear to auscultation bilaterally, normal work of breathing GI: soft, nontender, nondistended, + BS MS: no deformity or atrophy  Skin: warm and dry Neuro:  Strength and sensation are intact Psych: euthymic mood, full affect  EKG:  EKG is not ordered today. Personal review of the ekg ordered 03/11/19 shows sinus rhythm, rate 67  Recent Labs: 03/13/2019: Magnesium 1.7 03/15/2019: Hemoglobin 15.2; Platelets 199 06/24/2019: ALT 18; BUN 10; Creatinine, Ser 0.73; Potassium 4.0; Sodium 144    Lipid Panel     Component Value Date/Time   CHOL 191 03/06/2019 0445   TRIG 200 (H) 03/06/2019 0445   HDL 45 03/06/2019 0445   CHOLHDL 4.2 03/06/2019 0445   VLDL 40 03/06/2019 0445   LDLCALC 106 (H) 03/06/2019 0445     Wt Readings from Last 3 Encounters:  09/13/19 163 lb (73.9 kg)  08/31/19 161 lb 2 oz (73.1 kg)  07/14/19 165 lb 6.4 oz (75 kg)      Other studies Reviewed: Additional studies/ records that were reviewed today include: TTE 03/06/19  Review of the above records today demonstrates:  1. Left ventricular ejection fraction, by visual estimation, is 55 to  60%. The left ventricle has normal function. There is mildly increased  left ventricular hypertrophy.  2. Elevated left ventricular end-diastolic pressure.  3. Left ventricular diastolic parameters are consistent with Grade I  diastolic dysfunction (impaired relaxation).  4. Global right ventricle has normal systolic function.The right  ventricular size is normal. No increase in right ventricular wall  thickness.  5. Left atrial size was normal.  6. Right atrial size was normal.  7. The mitral valve is grossly normal. Trace mitral valve regurgitation.  8. The tricuspid valve is grossly normal. Tricuspid valve regurgitation  is trivial.  9. The aortic valve  is tricuspid. Aortic valve regurgitation is not  visualized. No evidence of aortic valve sclerosis or stenosis.  10. The pulmonic valve was grossly normal. Pulmonic valve regurgitation is  not visualized.  11. The inferior vena cava is normal in size with greater than 50%  respiratory variability, suggesting right atrial pressure of 3 mmHg.    ASSESSMENT AND PLAN:  1.  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  Could be due to atrial fibrillation.  We will Linq monitor implant.  Risks and benefits were discussed.  Risk include bleeding and infection.  She understands these risks and is agreed to the procedure.   2.  Hypertension: well controlled  3.  Hyperlipidemia: Continue atorvastatin    Current medicines are reviewed at length with the patient today.   The patient does not have concerns regarding her medicines.  The following changes were made today:  none  Labs/ tests ordered today include:  No orders of the defined types were  placed in this encounter.    Disposition:   FU with Will Camnitz ending Linq monitor results  Signed, Will Meredith Leeds, MD  09/13/2019 10:39 AM     [facility] [address] [place] [address] [phone] (office) [phone] (fax)  SURGEON:  Allegra Lai, MD     PREPROCEDURE DIAGNOSIS:  Cryptogenic Stroke    POSTPROCEDURE DIAGNOSIS:  Cryptogenic Stroke     PROCEDURES:   1. Implantable loop recorder implantation    INTRODUCTION:  TAMARA MONTEITH is a 64 y.o. female with a history of unexplained stroke who presents today for implantable loop implantation.  The patient has had a cryptogenic stroke.  Despite an extensive workup by neurology, no reversible causes have been identified.  she has worn telemetry during which she did not have arrhythmias.  There is significant concern for possible atrial fibrillation as the cause for the patients stroke.  The patient therefore presents today for  implantable loop implantation.     DESCRIPTION OF PROCEDURE:  Informed written consent was obtained, and the patient was brought to the electrophysiology lab in a fasting state.  The patient required no sedation for the procedure today.  Mapping over the patient's chest was performed by the EP lab staff to identify the area where electrograms were most prominent for ILR recording.  This area was found to be the left parasternal region over the 3rd-4th intercostal space. The patients left chest was therefore prepped and draped in the usual sterile fashion by the EP lab staff. The skin overlying the left parasternal region was infiltrated with lidocaine for local analgesia.  A 0.5-cm incision was made over the left parasternal region over the 3rd intercostal space.  A subcutaneous ILR pocket was fashioned using a combination of sharp and blunt dissection.  A Medtronic Reveal Pentress model [id number] implantable loop recorder was then placed into the pocket  R waves were very prominent and measured 0.59m. EBL<1 ml.  Steri- Strips and a sterile dressing were then applied.  There were no early apparent complications.     CONCLUSIONS:   1. Successful implantation of a Medtronic Reveal LINQ implantable loop recorder for cryptogenic stroke  2. No early apparent complications.

## 2019-09-14 ENCOUNTER — Telehealth: Payer: Self-pay | Admitting: Cardiology

## 2019-09-14 NOTE — Telephone Encounter (Signed)
Called patient to review d/c instructions. Patient scheduled for wound check 09/22/19 @ 11:30, husband will need to assist d/t memory issues.

## 2019-09-14 NOTE — Telephone Encounter (Signed)
Patient calling stating she just had her loop recorder implanted and she would like to know if she takes the bandage off after 24 hours. She would also like to know when to take a shower and if she will get a call to schedule her follow up appointment.

## 2019-09-22 ENCOUNTER — Other Ambulatory Visit: Payer: Self-pay

## 2019-09-22 ENCOUNTER — Ambulatory Visit (INDEPENDENT_AMBULATORY_CARE_PROVIDER_SITE_OTHER): Payer: Self-pay | Admitting: Emergency Medicine

## 2019-09-22 DIAGNOSIS — I639 Cerebral infarction, unspecified: Secondary | ICD-10-CM

## 2019-09-22 DIAGNOSIS — Z950 Presence of cardiac pacemaker: Secondary | ICD-10-CM

## 2019-09-22 LAB — CUP PACEART INCLINIC DEVICE CHECK
Date Time Interrogation Session: 20210603114522
Implantable Pulse Generator Implant Date: 20210525

## 2019-09-22 NOTE — Progress Notes (Signed)
ILR wound check in clinic. Steri strips removed. Wound well healed. Home monitor transmitting nightly. R-waves 0.31mV. No episodes. Pause and brady detection remain off. Educated patient about wound care, Carelink monitor, and monthly summary reports. ROV with Dr. Curt Bears PRN.

## 2019-09-30 ENCOUNTER — Telehealth (INDEPENDENT_AMBULATORY_CARE_PROVIDER_SITE_OTHER): Payer: Managed Care, Other (non HMO) | Admitting: Internal Medicine

## 2019-09-30 ENCOUNTER — Encounter: Payer: Self-pay | Admitting: Internal Medicine

## 2019-09-30 DIAGNOSIS — E1159 Type 2 diabetes mellitus with other circulatory complications: Secondary | ICD-10-CM

## 2019-09-30 DIAGNOSIS — I10 Essential (primary) hypertension: Secondary | ICD-10-CM

## 2019-09-30 DIAGNOSIS — E782 Mixed hyperlipidemia: Secondary | ICD-10-CM

## 2019-09-30 MED ORDER — METFORMIN HCL 500 MG PO TABS: 500.0000 mg | ORAL_TABLET | Freq: Every day | ORAL | 0 refills | Status: DC

## 2019-09-30 NOTE — Progress Notes (Signed)
Virtual Visit via Telephone Note  I connected with Belinda Banks, on 09/30/2019 at 8:33 AM by telephone due to the COVID-19 pandemic and verified that I am speaking with the correct person using two identifiers.   Consent: I discussed the limitations, risks, security and privacy concerns of performing an evaluation and management service by telephone and the availability of in person appointments. I also discussed with the patient that there may be a patient responsible charge related to this service. The patient expressed understanding and agreed to proceed.   Location of Patient: Home   Location of Provider: Clinic    Persons participating in Telemedicine visit: LILYANNA LUNT [facility] Dr. Juleen China      History of Present Illness: Patient has a visit to follow up on chronic medical conditions.   Diabetes mellitus, Type 2 Disease Monitoring             Blood Sugar Ranges: Fasting - Avg 90s. Nothing less than 70. Nothing above 110 fasting. Nothing above 130 after having a meal (checking about one hr after eating)              Polyuria: no              Visual problems: no   Urine Microalbumin 9 (March 2021)   Last A1C: 4.7 (Mach 2021)   Medications: Metformin 500 mg daily  Medication Compliance: yes  Medication Side Effects             Hypoglycemia: no    Chronic HTN Disease Monitoring:  Home BP Monitoring - BP 120/70s  Chest pain- no  Dyspnea- no Headache - no  Medications: Amlodipine 10 mg, Labetalol 100 mg TID, Losartan 50 mg  Compliance- yes Lightheadedness- no  Edema- no     Past Medical History:  Diagnosis Date  . Diabetes mellitus without complication ([place])    type 2  . Hypertension    controlled  . Stroke ([place])    x 2 02/2019   Allergies  Allergen Reactions  . Codeine Other (See Comments)    Made her loopy    Current Outpatient Medications on File Prior to Visit  Medication Sig Dispense Refill  . amLODipine  (NORVASC) 10 MG tablet Take 1 tablet (10 mg total) by mouth daily. 90 tablet 0  . aspirin 81 MG chewable tablet Chew 1 tablet (81 mg total) by mouth daily. 90 tablet 0  . atorvastatin (LIPITOR) 20 MG tablet Take 1 tablet (20 mg total) by mouth daily. 30 tablet 3  . Blood Glucose Monitoring Suppl (ONETOUCH VERIO) w/Device KIT Use 3 times daily before meals. 1 kit 0  . glucose blood test strip Use as instructed 3 times daily before meals 100 each 12  . labetalol (NORMODYNE) 100 MG tablet Take 1 tablet (100 mg total) by mouth 3 (three) times daily. 270 tablet 0  . Lancets (ONETOUCH ULTRASOFT) lancets Use as instructed 3 times daily before meals 100 each 12  . losartan (COZAAR) 50 MG tablet Take 1 tablet (50 mg total) by mouth 2 (two) times daily. 180 tablet 0  . metFORMIN (GLUCOPHAGE) 500 MG tablet Take 1 tablet (500 mg total) by mouth 2 (two) times daily with a meal. (Patient taking differently: Take 500 mg by mouth daily. ) 180 tablet 0   No current facility-administered medications on file prior to visit.    Observations/Objective: NAD. Speaking clearly.  Work of breathing normal.  Alert and oriented. Mood appropriate.   Assessment and  Plan: 1. Type 2 diabetes mellitus with other circulatory complication, without long-term current use of insulin (HCC) Glucose well controlled. If A1c remains well below goal, could discuss discontinuation of Metformin and proceeding with trial of diet control.  Counseled on Diabetic diet, my plate method, 761 minutes of moderate intensity exercise/week Blood sugar logs with fasting goals of 80-120 mg/dl, random of less than 180 and in the event of sugars less than 60 mg/dl or greater than 400 mg/dl encouraged to notify the clinic. Advised on the need for annual eye exams, annual foot exams, Pneumonia vaccine. - Hemoglobin A1c; Future - metFORMIN (GLUCOPHAGE) 500 MG tablet; Take 1 tablet (500 mg total) by mouth daily with breakfast.  Dispense: 180 tablet;  Refill: 0  2. Essential hypertension Blood pressure is well controlled with home readings. Continue current regimen.   3. Mixed hyperlipidemia Patient with prior history of CVA on Lipitor 20 mg for secondary prevention. Patient does have a history of ICH for which statin was held in the past. Confirmed with neurology that safe to continue with that remote history. Will plan to repeat LDL as last value was 106 in Nov 2020 and optimize statin intensity as needed. Goal is <70.    Follow Up Instructions: In 6 months for Dm and HTN    I discussed the assessment and treatment plan with the patient. The patient was provided an opportunity to ask questions and all were answered. The patient agreed with the plan and demonstrated an understanding of the instructions.   The patient was advised to call back or seek an in-person evaluation if the symptoms worsen or if the condition fails to improve as anticipated.     I provided 26 minutes total of non-face-to-face time during this encounter including median intraservice time, reviewing previous notes, investigations, ordering medications, medical decision making, coordinating care and patient verbalized understanding at the end of the visit.    Phill Myron, D.O. Primary Care at [facility]  09/30/2019, 8:33 AM

## 2019-10-04 ENCOUNTER — Encounter: Payer: Self-pay | Admitting: Gastroenterology

## 2019-10-05 ENCOUNTER — Telehealth: Payer: Self-pay | Admitting: Gastroenterology

## 2019-10-05 ENCOUNTER — Encounter: Payer: Self-pay | Admitting: Certified Registered Nurse Anesthetist

## 2019-10-05 NOTE — Telephone Encounter (Signed)
That should be okay. She should make sure to drink a couple of extra glasses of water this morning. There is a doctor on call tonight with any questions or concerns as she continues to prep. Thank you.

## 2019-10-05 NOTE — Telephone Encounter (Signed)
Procedure is scheduled for 10/06/19 at 1030am

## 2019-10-06 ENCOUNTER — Ambulatory Visit (AMBULATORY_SURGERY_CENTER): Payer: Managed Care, Other (non HMO) | Admitting: Gastroenterology

## 2019-10-06 ENCOUNTER — Other Ambulatory Visit: Payer: Self-pay

## 2019-10-06 ENCOUNTER — Encounter: Payer: Self-pay | Admitting: Gastroenterology

## 2019-10-06 ENCOUNTER — Telehealth: Payer: Self-pay

## 2019-10-06 VITALS — BP 125/75 | HR 68 | Temp 96.0°F | Resp 14 | Ht 67.0 in | Wt 161.0 lb

## 2019-10-06 DIAGNOSIS — D125 Benign neoplasm of sigmoid colon: Secondary | ICD-10-CM

## 2019-10-06 DIAGNOSIS — Z800 Family history of malignant neoplasm of digestive organs: Secondary | ICD-10-CM

## 2019-10-06 DIAGNOSIS — K5140 Inflammatory polyps of colon without complications: Secondary | ICD-10-CM | POA: Diagnosis not present

## 2019-10-06 DIAGNOSIS — D120 Benign neoplasm of cecum: Secondary | ICD-10-CM

## 2019-10-06 DIAGNOSIS — R194 Change in bowel habit: Secondary | ICD-10-CM

## 2019-10-06 DIAGNOSIS — Z8371 Family history of colonic polyps: Secondary | ICD-10-CM

## 2019-10-06 DIAGNOSIS — D124 Benign neoplasm of descending colon: Secondary | ICD-10-CM

## 2019-10-06 DIAGNOSIS — D122 Benign neoplasm of ascending colon: Secondary | ICD-10-CM | POA: Diagnosis not present

## 2019-10-06 DIAGNOSIS — Z1211 Encounter for screening for malignant neoplasm of colon: Secondary | ICD-10-CM | POA: Diagnosis present

## 2019-10-06 MED ORDER — SODIUM CHLORIDE 0.9 % IV SOLN: 500.0000 mL | INTRAVENOUS | Status: DC

## 2019-10-06 NOTE — Telephone Encounter (Signed)
Patient notified. Patient is scheduled for fasting labs on 10/10/2019.

## 2019-10-06 NOTE — Patient Instructions (Signed)
Handout on polyps, diverticulosis, high fiber and hemorrhoids given.  Drink at least 64 oz of water daily.  Add a daily stool bulking agent such as metamucil.     YOU HAD AN ENDOSCOPIC PROCEDURE TODAY AT [facility]:   Refer to the procedure report that was given to you for any specific questions about what was found during the examination.  If the procedure report does not answer your questions, please call your gastroenterologist to clarify.  If you requested that your care partner not be given the details of your procedure findings, then the procedure report has been included in a sealed envelope for you to review at your convenience later.  YOU SHOULD EXPECT: Some feelings of bloating in the abdomen. Passage of more gas than usual.  Walking can help get rid of the air that was put into your GI tract during the procedure and reduce the bloating. If you had a lower endoscopy (such as a colonoscopy or flexible sigmoidoscopy) you may notice spotting of blood in your stool or on the toilet paper. If you underwent a bowel prep for your procedure, you may not have a normal bowel movement for a few days.  Please Note:  You might notice some irritation and congestion in your nose or some drainage.  This is from the oxygen used during your procedure.  There is no need for concern and it should clear up in a day or so.  SYMPTOMS TO REPORT IMMEDIATELY:   Following lower endoscopy (colonoscopy or flexible sigmoidoscopy):  Excessive amounts of blood in the stool  Significant tenderness or worsening of abdominal pains  Swelling of the abdomen that is new, acute  Fever of 100F or higher   For urgent or emergent issues, a gastroenterologist can be reached at any hour by calling [phone]. Do not use MyChart messaging for urgent concerns.    DIET:  We do recommend a small meal at first, but then you may proceed to your regular diet.  Drink plenty of fluids but you should avoid  alcoholic beverages for 24 hours.  ACTIVITY:  You should plan to take it easy for the rest of today and you should NOT DRIVE or use heavy machinery until tomorrow (because of the sedation medicines used during the test).    FOLLOW UP: Our staff will call the number listed on your records 48-72 hours following your procedure to check on you and address any questions or concerns that you may have regarding the information given to you following your procedure. If we do not reach you, we will leave a message.  We will attempt to reach you two times.  During this call, we will ask if you have developed any symptoms of COVID 19. If you develop any symptoms (ie: fever, flu-like symptoms, shortness of breath, cough etc.) before then, please call [phone].  If you test positive for Covid 19 in the 2 weeks post procedure, please call and report this information to [place].    If any biopsies were taken you will be contacted by phone or by letter within the next 1-3 weeks.  Please call us at [phone] if you have not heard about the biopsies in 3 weeks.    SIGNATURES/CONFIDENTIALITY: You and/or your care partner have signed paperwork which will be entered into your electronic medical record.  These signatures attest to the fact that that the information above on your After Visit Summary has been reviewed and is understood.  Full  responsibility of the confidentiality of this discharge information lies with you and/or your care-partner.

## 2019-10-06 NOTE — Progress Notes (Signed)
Report given to PACU, vss 

## 2019-10-06 NOTE — Progress Notes (Signed)
Called to room to assist during endoscopic procedure.  Patient ID and intended procedure confirmed with present staff. Received instructions for my participation in the procedure from the performing physician.  

## 2019-10-06 NOTE — Op Note (Signed)
[facility] Patient Name: Belinda Banks Procedure Date: 10/06/2019 10:17 AM MRN: [id number] Endoscopist: Thornton Park MD, MD Age: 64 Referring MD:  Date of Birth: 09/04/1955 Gender: Female Account #: [id number] Procedure:                Colonoscopy Indications:              Screening for colorectal malignant neoplasm                           Colonoscopy to the cecum with Dr. Penelope Coop 01/06/2007:                            ascending colon diverticulosis, small internal                            hemorrhoids.                           Family history of colon polyps (father)                           Family history of colon cancer (paternal                            grandmother at age 43) Medicines:                Monitored Anesthesia Care Procedure:                Pre-Anesthesia Assessment:                           - Prior to the procedure, a History and Physical                            was performed, and patient medications and                            allergies were reviewed. The patient's tolerance of                            previous anesthesia was also reviewed. The risks                            and benefits of the procedure and the sedation                            options and risks were discussed with the patient.                            All questions were answered, and informed consent                            was obtained. Prior Anticoagulants: The patient has                            taken no previous anticoagulant  or antiplatelet                            agents. ASA Grade Assessment: III - A patient with                            severe systemic disease. After reviewing the risks                            and benefits, the patient was deemed in                            satisfactory condition to undergo the procedure.                           After obtaining informed consent, the colonoscope                            was passed under  direct vision. Throughout the                            procedure, the patient's blood pressure, pulse, and                            oxygen saturations were monitored continuously. The                            Colonoscope was introduced through the anus and                            advanced to the 3 cm into the ileum. The terminal                            ileum, ileocecal valve, appendiceal orifice, and                            rectum were photographed. The quality of the bowel                            preparation was good. The entire colon was                            examined. The colonoscopy was performed without                            difficulty. The patient tolerated the procedure                            well. Scope In: 10:32:13 AM Scope Out: 10:52:53 AM Scope Withdrawal Time: 0 hours 16 minutes 44 seconds  Total Procedure Duration: 0 hours 20 minutes 40 seconds  Findings:                 Non-bleeding external and internal hemorrhoids were  found.                           A 2 mm polyp was found in the distal sigmoid colon.                            The polyp was sessile and had an inflammatory                            appearance. The polyp was removed with a piecemeal                            technique using a cold biopsy forceps. Due to the                            angle of the polyp and deep recession between                            diverticulosis, a snare could not be used for                            resection. Resection and retrieval were complete.                            Estimated blood loss was minimal.                           Three sessile polyps were found in the descending                            colon, ascending colon and cecum. The polyps were 1                            to 3 mm in size. These polyps were removed with a                            cold snare. Resection and retrieval were complete.                             Estimated blood loss was minimal.                           Pancolonic diverticulosis was seen. The exam was                            otherwise without abnormality on direct and                            retroflexion views. Complications:            No immediate complications. Estimated blood loss:                            Minimal. Estimated Blood Loss:  Estimated blood loss was minimal. Impression:               - Non-bleeding external and internal hemorrhoids.                           - One 2 mm polyp in the distal sigmoid colon,                            removed piecemeal using a cold biopsy forceps.                            Resected and retrieved.                           - Three 1 to 3 mm polyps in the descending colon,                            in the ascending colon and in the cecum, removed                            with a cold snare. Resected and retrieved.                           - The examination was otherwise normal on direct                            and retroflexion views. Recommendation:           - Patient has a contact number available for                            emergencies. The signs and symptoms of potential                            delayed complications were discussed with the                            patient. Return to normal activities tomorrow.                            Written discharge instructions were provided to the                            patient.                           - Follow a high fiber diet. Drink at least 64                            ounces of water daily. Add a daily stool bulking                            agent such as psyllium (an exampled would be  Metamucil).                           - Continue present medications.                           - Await pathology results.                           - Repeat colonoscopy date to be determined after                             pending pathology results are reviewed for                            surveillance.                           - Emerging evidence supports eating a diet of                            fruits, vegetables, grains, calcium, and yogurt                            while reducing red meat and alcohol may reduce the                            risk of colon cancer.                           - Thank you for allowing me to be involved in your                            colon cancer prevention. Thornton Park MD, MD 10/06/2019 11:04:28 AM This report has been signed electronically.

## 2019-10-06 NOTE — Progress Notes (Signed)
Vs CW ° °

## 2019-10-06 NOTE — Telephone Encounter (Signed)
-----   Message from Nicolette Bang, DO sent at 10/06/2019  4:01 PM EDT ----- Belinda Banks,   Following up on patient's visit from last Friday. I needed to touch base with neurology about her statin due to history of her intracranial hemorrhage and ensure it was safe for her to continue as patient brought up concerns about that. I did speak with them and they recommended continuation and to intensify pending LDL results. Would you please call patient to let her know that she should be taking this medication per neuro. I would also like her to return for repeat lipid panel in the near future. Her goal is to have LDL <70 and in Nov it was 106. If elevated on repeat, we will increase the Lipitor dose.   Thanks,  Phill Myron, D.O. Primary Care at [facility]  10/06/2019, 4:03 PM

## 2019-10-10 ENCOUNTER — Other Ambulatory Visit: Payer: Self-pay

## 2019-10-10 ENCOUNTER — Encounter: Payer: Self-pay | Admitting: Gastroenterology

## 2019-10-10 ENCOUNTER — Telehealth: Payer: Self-pay | Admitting: *Deleted

## 2019-10-10 ENCOUNTER — Other Ambulatory Visit (INDEPENDENT_AMBULATORY_CARE_PROVIDER_SITE_OTHER): Payer: Managed Care, Other (non HMO)

## 2019-10-10 DIAGNOSIS — E782 Mixed hyperlipidemia: Secondary | ICD-10-CM | POA: Diagnosis not present

## 2019-10-10 DIAGNOSIS — E1159 Type 2 diabetes mellitus with other circulatory complications: Secondary | ICD-10-CM | POA: Diagnosis not present

## 2019-10-10 NOTE — Telephone Encounter (Signed)
  Follow up Call-  Call back number 10/06/2019  Post procedure Call Back phone  # [phone]  Permission to leave phone message Yes  Some recent data might be hidden     Patient questions:  Do you have a fever, pain , or abdominal swelling? No. Pain Score  0 *  Have you tolerated food without any problems? Yes.    Have you been able to return to your normal activities? Yes.    Do you have any questions about your discharge instructions: Diet   No. Medications  No. Follow up visit  No.  Do you have questions or concerns about your Care? No.  Actions: * If pain score is 4 or above: No action needed, pain <4.  1. Have you developed a fever since your procedure? no  2.   Have you had an respiratory symptoms (SOB or cough) since your procedure? no  3.   Have you tested positive for COVID 19 since your procedure no  4.   Have you had any family members/close contacts diagnosed with the COVID 19 since your procedure?  no   If yes to any of these questions please route to Joylene John, RN and Erenest Rasher, RN

## 2019-10-11 ENCOUNTER — Other Ambulatory Visit: Payer: Self-pay | Admitting: Internal Medicine

## 2019-10-11 DIAGNOSIS — E785 Hyperlipidemia, unspecified: Secondary | ICD-10-CM

## 2019-10-11 LAB — LIPID PANEL
Chol/HDL Ratio: 2.7 ratio (ref 0.0–4.4)
Cholesterol, Total: 158 mg/dL (ref 100–199)
HDL: 58 mg/dL (ref >39)
LDL Chol Calc (NIH): 85 mg/dL (ref 0–99)
Triglycerides: 81 mg/dL (ref 0–149)
VLDL Cholesterol Cal: 15 mg/dL (ref 5–40)

## 2019-10-11 LAB — HEMOGLOBIN A1C
Est. average glucose Bld gHb Est-mCnc: 100 mg/dL
Hgb A1c MFr Bld: 5.1 % (ref 4.8–5.6)

## 2019-10-11 MED ORDER — ATORVASTATIN CALCIUM 40 MG PO TABS: 40.0000 mg | ORAL_TABLET | Freq: Every day | ORAL | 1 refills | Status: DC

## 2019-10-13 NOTE — Progress Notes (Signed)
Patient notified of results & recommendations. Expressed understanding.

## 2019-10-17 ENCOUNTER — Ambulatory Visit (INDEPENDENT_AMBULATORY_CARE_PROVIDER_SITE_OTHER): Payer: Managed Care, Other (non HMO) | Admitting: *Deleted

## 2019-10-17 ENCOUNTER — Ambulatory Visit: Payer: Managed Care, Other (non HMO) | Admitting: Adult Health

## 2019-10-17 DIAGNOSIS — I6300 Cerebral infarction due to thrombosis of unspecified precerebral artery: Secondary | ICD-10-CM | POA: Diagnosis not present

## 2019-10-17 LAB — CUP PACEART REMOTE DEVICE CHECK
Date Time Interrogation Session: 20210627094401
Implantable Pulse Generator Implant Date: 20210525

## 2019-10-18 NOTE — Progress Notes (Signed)
Carelink Summary Report / Loop Recorder 

## 2019-10-20 ENCOUNTER — Other Ambulatory Visit: Payer: Self-pay

## 2019-10-20 DIAGNOSIS — R194 Change in bowel habit: Secondary | ICD-10-CM

## 2019-10-21 ENCOUNTER — Other Ambulatory Visit: Payer: Self-pay

## 2019-10-21 ENCOUNTER — Telehealth: Payer: Self-pay

## 2019-10-21 DIAGNOSIS — R194 Change in bowel habit: Secondary | ICD-10-CM

## 2019-10-21 DIAGNOSIS — K458 Other specified abdominal hernia without obstruction or gangrene: Secondary | ICD-10-CM

## 2019-10-21 NOTE — Telephone Encounter (Signed)
Pt scheduled for AM at [facility] 12/28/19@8 :30am, pt to arrive there at 8am. Amb referral entered in epic. Instructions sent to pt via mychart.

## 2019-10-21 NOTE — Telephone Encounter (Signed)
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 Thank you!  KLB

## 2019-10-30 ENCOUNTER — Other Ambulatory Visit: Payer: Self-pay | Admitting: Adult Health

## 2019-10-30 DIAGNOSIS — E785 Hyperlipidemia, unspecified: Secondary | ICD-10-CM

## 2019-11-04 ENCOUNTER — Telehealth: Payer: Self-pay

## 2019-11-04 NOTE — Telephone Encounter (Signed)
I let the pt know she do not have to send manual transmissions.

## 2019-11-21 ENCOUNTER — Ambulatory Visit (INDEPENDENT_AMBULATORY_CARE_PROVIDER_SITE_OTHER): Payer: Managed Care, Other (non HMO) | Admitting: *Deleted

## 2019-11-21 ENCOUNTER — Telehealth: Payer: Self-pay | Admitting: Internal Medicine

## 2019-11-21 DIAGNOSIS — I6300 Cerebral infarction due to thrombosis of unspecified precerebral artery: Secondary | ICD-10-CM

## 2019-11-21 DIAGNOSIS — E1159 Type 2 diabetes mellitus with other circulatory complications: Secondary | ICD-10-CM

## 2019-11-21 NOTE — Telephone Encounter (Signed)
Patient called in and requested for listed medications to be refilled and sent to [id number] - [place], [place] - [address], [place] [address]   amLODipine (NORVASC) 10 MG tablet [[id number]]  aspirin 81 MG chewable tablet [[id number]]  losartan (COZAAR) 50 MG tablet [[id number]]  labetalol (NORMODYNE) 100 MG tablet [[id number]]

## 2019-11-22 MED ORDER — LABETALOL HCL 100 MG PO TABS: 100.0000 mg | ORAL_TABLET | Freq: Three times a day (TID) | ORAL | 0 refills | Status: DC

## 2019-11-22 MED ORDER — AMLODIPINE BESYLATE 10 MG PO TABS: 10.0000 mg | ORAL_TABLET | Freq: Every day | ORAL | 0 refills | Status: DC

## 2019-11-22 MED ORDER — ASPIRIN 81 MG PO CHEW: 81.0000 mg | CHEWABLE_TABLET | Freq: Every day | ORAL | 0 refills | Status: DC

## 2019-11-22 MED ORDER — METFORMIN HCL 500 MG PO TABS: 500.0000 mg | ORAL_TABLET | Freq: Every day | ORAL | 0 refills | Status: DC

## 2019-11-22 MED ORDER — LOSARTAN POTASSIUM 50 MG PO TABS: 50.0000 mg | ORAL_TABLET | Freq: Two times a day (BID) | ORAL | 0 refills | Status: DC

## 2019-11-23 LAB — CUP PACEART REMOTE DEVICE CHECK
Date Time Interrogation Session: 20210730094815
Implantable Pulse Generator Implant Date: 20210525

## 2019-11-24 NOTE — Progress Notes (Signed)
Carelink Summary Report / Loop Recorder 

## 2019-12-06 ENCOUNTER — Ambulatory Visit (INDEPENDENT_AMBULATORY_CARE_PROVIDER_SITE_OTHER): Payer: Managed Care, Other (non HMO) | Admitting: Adult Health

## 2019-12-06 ENCOUNTER — Encounter: Payer: Self-pay | Admitting: Adult Health

## 2019-12-06 VITALS — BP 120/84 | HR 79 | Ht 67.0 in | Wt 167.0 lb

## 2019-12-06 DIAGNOSIS — I610 Nontraumatic intracerebral hemorrhage in hemisphere, subcortical: Secondary | ICD-10-CM | POA: Diagnosis not present

## 2019-12-06 DIAGNOSIS — E119 Type 2 diabetes mellitus without complications: Secondary | ICD-10-CM

## 2019-12-06 DIAGNOSIS — I639 Cerebral infarction, unspecified: Secondary | ICD-10-CM | POA: Diagnosis not present

## 2019-12-06 DIAGNOSIS — I10 Essential (primary) hypertension: Secondary | ICD-10-CM | POA: Diagnosis not present

## 2019-12-06 DIAGNOSIS — H539 Unspecified visual disturbance: Secondary | ICD-10-CM

## 2019-12-06 DIAGNOSIS — E785 Hyperlipidemia, unspecified: Secondary | ICD-10-CM

## 2019-12-06 DIAGNOSIS — I69398 Other sequelae of cerebral infarction: Secondary | ICD-10-CM

## 2019-12-06 NOTE — Patient Instructions (Addendum)
Continue to do exercises at home for possible further improvement - please let us know if you would like to participate in additional therapy sessions   Try use of melatonin 5-10mg  at sunset to see if this helps with insomnia If no benefit, would recommend evaluation of sleep testing  Continue aspirin 81 mg daily  and atorvastatin 40 mg daily for secondary stroke prevention  Continue to follow up with PCP regarding cholesterol, blood pressure and diabetes management  Maintain strict control of hypertension with blood pressure goal below 130/90, diabetes with hemoglobin A1c goal below 7.0% and cholesterol with LDL cholesterol (bad cholesterol) goal below 70 mg/dL.     Followup in the future with me in 6 months or call earlier if needed        Thank you for coming to see [place] at [facility]. I hope we have been able to provide you high quality care today.  You may receive a patient satisfaction survey over the next few weeks. We would appreciate your feedback and comments so that we may continue to improve ourselves and the health of our patients.

## 2019-12-06 NOTE — Progress Notes (Signed)
[facility] [address] [place]. [place] [address] [phone]       STROKE FOLLOW UP NOTE  [name] Date of Birth:  October 09, 1955 Medical Record Number:  [id number]   Reason for Referral:  stroke follow up    CHIEF COMPLAINT:  Chief Complaint  Patient presents with  . Follow-up    f/u after stroke. States she has been doing well since last visit. Denies any new symptoms  . room 5    with husband    HPI:  Today, 12/06/2019, Belinda Banks returns for stroke follow-up accompanied by her husband.  Residual deficits of short-term memory impairment and visual impairment; she questions possible worsening vs being more aware of deficits  Prism glasses provided some benefit in regards to reading but continues to have difficulty with comprehension and understanding what she is reading.  She is not able to read at all without use of prism glasses. 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  Completed STD and only received LTD for 2 days - she has since retired and currently planning on applying for social security disability.  Denies new stroke/TIA symptoms  Remains on aspirin 81 mg daily and atorvastatin 40 mg daily for secondary stroke prevention without side effects Blood pressure today 120/84 Glucose levels have been stable -reports PCP discontinued metformin due to improvement of A1c HTN, HLD and DM managed by PCP Loop recorder placed on 09/22/2019 which has not shown atrial fibrillation thus far  Other concerns She does report cluster periods of difficulty staying asleep does not have issue falling asleep She has trialed different types of melatonin but would only take intermittently as she did not find benefit Denies snoring, morning headaches, or witnessed apnea Occasional day time fatigue with occasional afternoon naps but denies excessive fatigue  No further  concerns at this time     History provided for reference purposes only Update 06/24/2019 JM: Belinda Banks is a 64 year old female who is being seen today, 07/14/2019, for stroke follow-up.  She continues to have residual visual difficulties and was evaluated by ophthalmology on 06/03/2019 which showed bilateral inferior altitudinal visual field defects as well as bilateral homonymous inferior quadrantanopia.  She was referred to low vision specialist at [facility] low vision center for further evaluation.  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  Hallucinations likely release hallucinations as they appeared on her blind areas - have improved since prior visit.  She does plan on follow-up with her life specialist in 3 months.  She does report mild improvement of vision but continues to have difficulty with reading and remembering numbers.  She also complains of short-term memory deficit.  She has completed outpatient therapy but continues to do exercises at home.  She continues on short-term disability due to ongoing visual deficits. She did have follow-up with cardiology who recommended placement of loop recorder due to cryptogenic stroke etiology based on further recommendations at today's visit.  Continues on aspirin 81 mg daily without bleeding or bruising.  Repeat LFTs normalized and recommended initiating atorvastatin 20 mg daily.  Recent repeat LFT by PCP remains WNL.  Blood pressure today 111/74.  No further concerns at this time.   Initial visit 04/20/2019 JM: Belinda Banks is a 64 year old female who is being seen today for hospital follow-up accompanied by her husband.  Residual deficits of visual impairment, cognitive impairment, aphasia and decreased right hand dexterity.  She has difficulty fully explaining visual impairment as she denies diplopia or blurred vision but describes it as  distortion of images and words which is worsened if she is concentrating for prolonged period of time.  Vision will improve after closing and resting eyes.  She also endorses occasional visual hallucinations.  She has not had evaluation by ophthalmology at this time and is requesting evaluation by neuro-ophthalmology as recommended by therapy.  She continues to work with outpatient speech and OT with ongoing improvement.  She has not been able to return to work due to [employer] of job requires computer and paperwork.  She is currently receiving short-term disability from hospitalization and is requesting continuation of short-term disability through our office.  She has not established care with PCP at this time but does have initial appointment in approximately 1 month.  She also endorses occasional progressive symptoms of left hand numbness slowly progressing up arm and then will begin tremoring lasting 30 seconds to 1 minute.  Usually occurs after activity especially after shower.  She has continued on aspirin 81 mg daily without bleeding or bruising.  She has not had repeat LFTs as she does not have established PCP at this time therefore currently not on statin therapy.  Blood pressure today satisfactory at 117/79.  Review of vasculitis work-up all unremarkable.  She reports decreasing EtOH use and has only had approximately 3 drinks since discharge.  No further concerns at this time.  Stroke admission 03/05/2019: Belinda H McBrayeris a 64 y.o.femalewith history of HTN(no recent medical f/u) who presented to [facility] ED as a code stroke with c/o slurred speech, right sided weakness, facial droop, BP 230/158 and glucose 238. CT head showed small ICH centered at the left lentiform nucleus estimated volume 3.8 cc felt secondary to hypertensive.  CTA head/neck unchanged size of hypertensive hemorrhage in left basal ganglia without evidence of aneurysm or AVM.  2D echo normal EF without cardiac source of embolus  identified.  COVID-19 negative.  Hypertensive emergency on arrival treated with Cleviprex with increasing Cozaar, initiating labetalol and continuation of Norvasc.   Alcohol abuse 4-6 drinks per day placed on B1/FA/MVI and advised to drink no more than one alcoholic beverage per day.  New diagnosis of uncontrolled DM with A1c 7.6 and initiated Metformin 500 mg twice daily and advised to follow-up with PCP outpatient.  LDL 106 and initiated atorvastatin 20 mg daily.  Other stroke risk factors include former tobacco use and sedentary lifestyle.  Residual deficits of right facial droop, right pronator drift with decreased right hand dexterity and discharged home in stable condition with outpatient therapies.  Stroke admission 03/11/2019: She returned on 03/11/2019 with complaints of generalized weakness, nausea and slight confusion.  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  MRI w/wo contrast showed multiple acute to early subacute cerebral infarcts bilaterally largest in posterior left MCA region, stable appearance of subacute hemorrhage, and moderate chronic small vessel ischemic disease with multiple chronic lacunar infarcts.  CTA head and neck and 2D echo not repeated as performed on prior admission.  Underwent TEE and was negative for vegetations, embolus or PFO.  Discontinued atorvastatin due to elevated LFTs.  Evaluated by Dr. Leonie Man who felt initial infarct was hemorrhagic rather than primary intracerebral hemorrhage.  Recommended initiating aspirin 81 mg daily for stroke prevention.  Felt to be not a candidate for long-term anticoagulation at that time but may consider loop recorder placement in the future for further embolic work-up.  Vasculitis work-up pending at discharge.  Residual deficits of right peripheral partial field loss, mild right lower facial  asymmetry, diminished fine finger movements on the right, mild weakness of right grip and mild right hip flexor and ankle dorsiflexion weakness.  Discharged home with recommendation of outpatient therapy.       ROS:   14 system review of systems performed and negative with exception of visual impairment and short-term memory deficit and insomnia  PMH:  Past Medical History:  Diagnosis Date  . Cataract   . Chronic kidney disease   . Diabetes mellitus without complication ([place])    type 2  . Hyperlipidemia   . Hypertension    controlled  . Stroke [facility])    x 2 02/2019    PSH:  Past Surgical History:  Procedure Laterality Date  . ABDOMINAL HYSTERECTOMY    . BACK SURGERY    . BUBBLE STUDY  03/14/2019   Procedure: BUBBLE STUDY;  Surgeon: [name], MD;  Location: [facility] ENDOSCOPY;  Service: Cardiovascular;;  . COLONOSCOPY     approx 10 years at [place]  . TEE WITHOUT CARDIOVERSION N/A 03/14/2019   Procedure: TRANSESOPHAGEAL ECHOCARDIOGRAM (TEE);  Surgeon: [name] [name], MD;  Location: [facility] ENDOSCOPY;  Service: Cardiovascular;  Laterality: N/A;    Social History:  Social History   Socioeconomic History  . Marital status: Married    Spouse name: Not on file  . Number of children: 2  . Years of education: Not on file  . Highest education level: Not on file  Occupational History  . Not on file  Tobacco Use  . Smoking status: Former Smoker    Quit date: 10/05/1980    Years since quitting: 39.1  . Smokeless tobacco: Never Used  Substance and Sexual Activity  . Alcohol use: Yes    Alcohol/week: 1.0 standard drink    Types: 1 Glasses of wine per week    Comment: daily  . Drug use: Never  . Sexual activity: Not on file  Other Topics Concern  . Not on file  Social History Narrative  . Not on file   Social Determinants of Health   Financial Resource Strain:   . Difficulty of Paying Living Expenses:   Food Insecurity:   . Worried About Charity fundraiser in the  Last Year:   . Arboriculturist in the Last Year:   Transportation Needs:   . Film/video editor (Medical):   Marland Kitchen Lack of Transportation (Non-Medical):   Physical Activity:   . Days of Exercise per Week:   . Minutes of Exercise per Session:   Stress:   . Feeling of Stress :   Social Connections:   . Frequency of Communication with Friends and Family:   . Frequency of Social Gatherings with Friends and Family:   . Attends Religious Services:   . Active Member of Clubs or Organizations:   . Attends Archivist Meetings:   Marland Kitchen Marital Status:   Intimate Partner Violence:   . Fear of Current or Ex-Partner:   . Emotionally Abused:   Marland Kitchen Physically Abused:   . Sexually Abused:     Family History:  Family History  Problem Relation Age of Onset  . Hypertension Mother   . Diabetes Father   . Hypertension Father   . Colon  polyps Father   . Pancreatic cancer Sister   . Heart attack Maternal Grandmother   . Heart failure Maternal Grandmother   . Hypertension Maternal Grandmother   . Colon cancer Paternal Grandmother   . Heart attack Paternal Grandfather     Medications:   Current Outpatient Medications on File Prior to Visit  Medication Sig Dispense Refill  . amLODipine (NORVASC) 10 MG tablet Take 1 tablet (10 mg total) by mouth daily. 90 tablet 0  . aspirin 81 MG chewable tablet Chew 1 tablet (81 mg total) by mouth daily. 90 tablet 0  . atorvastatin (LIPITOR) 40 MG tablet Take 1 tablet (40 mg total) by mouth daily. 90 tablet 1  . Blood Glucose Monitoring Suppl (ONETOUCH VERIO) w/Device KIT Use 3 times daily before meals. 1 kit 0  . glucose blood test strip Use as instructed 3 times daily before meals 100 each 12  . labetalol (NORMODYNE) 100 MG tablet Take 1 tablet (100 mg total) by mouth 3 (three) times daily. 270 tablet 0  . Lancets (ONETOUCH ULTRASOFT) lancets Use as instructed 3 times daily before meals 100 each 12  . losartan (COZAAR) 50 MG tablet Take 1 tablet (50 mg  total) by mouth 2 (two) times daily. 180 tablet 0   No current facility-administered medications on file prior to visit.    Allergies:   Allergies  Allergen Reactions  . Codeine Other (See Comments)    Made her loopy     Physical Exam  Vitals:   12/06/19 0730  BP: 120/84  Pulse: 79  Weight: 167 lb (75.8 kg)  Height: '5\' 7"'$  (1.702 m)   Body mass index is 26.16 kg/m. No exam data present   General: well developed, well nourished,  pleasant middle-age Caucasian female, seated, in no evident distress Neck: supple with no carotid or supraclavicular bruits Cardiovascular: regular rate and rhythm, no murmurs Vascular:  Normal pulses all extremities   Neurologic Exam Mental Status: Awake and fully alert.  Fluent speech and language.  Oriented to place and time. Recent memory subjectively diminished and remote memory intact. Attention span, concentration and fund of knowledge appropriate. Mood and affect appropriate.  Cranial Nerves: Pupils equal, briskly reactive to light. Extraocular movements full. Visual fields full to confrontation.  Hearing intact. Facial sensation intact. Face, tongue, palate moves normally and symmetrically.  Motor: Normal bulk and tone.  Full strength in all tested extremities Sensory.: intact to touch , pinprick , position and vibratory sensation.  Coordination: Rapid alternating movements normal in all extremities.  Finger-to-nose and heel-to-shin performed accurately bilaterally.  Slight left hand intention tremor.  No tremor at rest, bradykinesia or cogwheel rigidity Gait and Station: Arises from chair without difficulty. Stance is normal. Gait demonstrates normal stride length and balance Reflexes: 1+ and symmetric. Toes downgoing.         ASSESSMENT: AUBRIEL KHANNA is a 64 y.o. year old female with recent admission on 03/05/2019 for left basal ganglia hemorrhage felt at that time to be hypertensive in etiology and then returned on 03/11/2019 with  generalized weakness, nausea and slight confusion and found to have multiple additional acute and subacute bilateral cerebral infarcts embolic pattern secondary to unknown source.  Septic emboli and vasculitis ruled out.  Loop recorder placed on 09/22/2019 due to cryptogenic etiology.  Vascular risk factors include HTN, HLD, DM, history of EtOH use, new diagnosis DM.      PLAN:  1. Multiple strokes as above:  a. Residual deficits: Visual field deficit,  visual spatial deficit and short-term memory impairment.  Use of prism glasses with only mild benefit in regards to reading.  Offered additional OT sessions but she declined at this time as she continues to do exercises at home.  Currently pursuing Social Security disability due to residual deficits and inability to perform prior job functions adequately b. Continue aspirin 81 mg daily and atorvastatin 20 mg daily for secondary stroke prevention.  c. Loop recorder placed 09/22/2019: Has not shown atrial fibrillation thus far; continue to monitor by cardiology d. Ensure close PCP follow-up for aggressive stroke risk factor management 2. HTN: BP goal <130/90.  Stable.  Managed by PCP 3. HLD: LDL goal <70.  Continue atorvastatin.  Managed and prescribed by PCP 4. DMII: A1c goal <7.0.  Stable 5. Insomnia: Declines interest in formal sleep evaluation as she does not wish to use CPAP or other sleep apnea treatment if sleep apnea is found.  Discussed increased risk of untreated sleep apnea such as recurrent stroke and cardiovascular disease which she verbalized understanding.  Recommend trial of melatonin nightly for at least 1 week for possible benefit as she only trialed for 1 night.  She plans on discussing further with PCP if she continues to have difficulty   Follow up in 6 months or call earlier if needed   I spent 35 minutes of face-to-face and non-face-to-face time with patient and husband.  This included previsit chart review, lab review, study  review, order entry, electronic health record documentation, patient education regarding prior strokes, residual deficits, importance of managing stroke risk factors, insomnia concerns, and answered all questions to patient and husband satisfaction    Frann Rider, AGNP-BC  [facility] [address] [place], [place] [address]  Phone [phone] Fax [phone] Note: This document was prepared with digital dictation and possible smart phrase technology. Any transcriptional errors that result from this process are unintentional.

## 2019-12-09 NOTE — Progress Notes (Signed)
I agree with the above plan 

## 2019-12-22 ENCOUNTER — Ambulatory Visit (INDEPENDENT_AMBULATORY_CARE_PROVIDER_SITE_OTHER): Payer: Managed Care, Other (non HMO) | Admitting: *Deleted

## 2019-12-22 DIAGNOSIS — I6300 Cerebral infarction due to thrombosis of unspecified precerebral artery: Secondary | ICD-10-CM

## 2019-12-22 LAB — CUP PACEART REMOTE DEVICE CHECK
Date Time Interrogation Session: 20210901095126
Implantable Pulse Generator Implant Date: 20210525

## 2019-12-23 NOTE — Progress Notes (Signed)
Carelink Summary Report / Loop Recorder 

## 2019-12-24 ENCOUNTER — Other Ambulatory Visit (HOSPITAL_COMMUNITY)
Admission: RE | Admit: 2019-12-24 | Discharge: 2019-12-24 | Disposition: A | Discharge status: Home / Self Care | Payer: Managed Care, Other (non HMO) | Source: Ambulatory Visit | Attending: Gastroenterology | Admitting: Gastroenterology

## 2019-12-24 DIAGNOSIS — Z01812 Encounter for preprocedural laboratory examination: Secondary | ICD-10-CM | POA: Diagnosis not present

## 2019-12-24 DIAGNOSIS — Z20822 Contact with and (suspected) exposure to covid-19: Secondary | ICD-10-CM | POA: Insufficient documentation

## 2019-12-24 LAB — SARS CORONAVIRUS 2 (TAT 6-24 HRS): SARS Coronavirus 2: NEGATIVE

## 2019-12-28 ENCOUNTER — Encounter (HOSPITAL_COMMUNITY)
Admission: RE | Disposition: A | Discharge status: Home / Self Care | Payer: Self-pay | Source: Home / Self Care | Attending: Gastroenterology

## 2019-12-28 ENCOUNTER — Ambulatory Visit (HOSPITAL_COMMUNITY)
Admission: RE | Admit: 2019-12-28 | Discharge: 2019-12-28 | Disposition: A | Discharge status: Home / Self Care | Payer: Managed Care, Other (non HMO) | Attending: Gastroenterology | Admitting: Gastroenterology

## 2019-12-28 DIAGNOSIS — K5902 Outlet dysfunction constipation: Secondary | ICD-10-CM | POA: Insufficient documentation

## 2019-12-28 DIAGNOSIS — K5900 Constipation, unspecified: Secondary | ICD-10-CM | POA: Diagnosis present

## 2019-12-28 DIAGNOSIS — K6289 Other specified diseases of anus and rectum: Secondary | ICD-10-CM | POA: Diagnosis not present

## 2019-12-28 HISTORY — PX: ANAL RECTAL MANOMETRY: SHX6358

## 2019-12-28 SURGERY — MANOMETRY, ANORECTAL
Anesthesia: LOCAL

## 2019-12-28 NOTE — Progress Notes (Signed)
Anal manometry done per protocol. Pt tolerated well without distress or complication   

## 2019-12-29 ENCOUNTER — Encounter (HOSPITAL_COMMUNITY): Payer: Self-pay | Admitting: Gastroenterology

## 2020-01-02 ENCOUNTER — Telehealth: Payer: Self-pay | Admitting: Gastroenterology

## 2020-01-02 NOTE — Telephone Encounter (Signed)
Pt states she had the anorectal mano last week. Jul 26, 2022 [place] and broke out in a cold sweat, had bad abd cramping and her bowels released and she had diarrhea all down her leg. She went home and passed 1-2 tbsp of bloody mucous off and on until it stopped about 5am this morning. Pt states she feels normal not and the cramping has stopped. She has not eaten much during this time and has not passed any stool since the diarrhea she passed on 07-26-22. Pt wants to know if Dr. Tarri Glenn feels this is normal from the AM she had or what she recommends, please advise.

## 2020-01-02 NOTE — Telephone Encounter (Signed)
Spoke with pt and she is aware and knows to call back if she starts having the symptoms again.

## 2020-01-02 NOTE — Telephone Encounter (Signed)
This would not be expected after her anorectal manometry. I am thankful that she is feeling better and hope it continues. Given the acute nature of her symptoms, could she have had an infection? If symptoms return/persist, please ask her to come in for a stool pathogen panel, CBC,  and office visit with me or APP (whoever has earliest availability).

## 2020-01-02 NOTE — Telephone Encounter (Signed)
Patient called states she having rectal bleeding after having an explosion of BM over the weekend has gotten better but she wants to make sure that is normal. Just had a rectal manometry procedure done with Dr. Silverio Decamp.

## 2020-01-19 ENCOUNTER — Telehealth: Payer: Self-pay | Admitting: Gastroenterology

## 2020-01-19 DIAGNOSIS — K5902 Outlet dysfunction constipation: Secondary | ICD-10-CM

## 2020-01-19 NOTE — Telephone Encounter (Signed)
Spoke with pt and she is aware and knows this is the inability to coordinate the abdominal and pelvic floor muscles to evacuate stools. Referral made to PT for pelvic floor dysfunction and biofeedback. Pt knows she will get a call from PT regarding the appt.

## 2020-01-19 NOTE — Telephone Encounter (Signed)
Pt calling for ano-rectal manometry results. Please advise.

## 2020-01-19 NOTE — Telephone Encounter (Signed)
She has findings suggestive of dysysnergic defecation. Please send referral to pelvic floor PT for biofeedback and inform patient results. Thanks  Full report will be scanned to epic

## 2020-01-23 ENCOUNTER — Other Ambulatory Visit: Payer: Self-pay

## 2020-01-23 DIAGNOSIS — K5902 Outlet dysfunction constipation: Secondary | ICD-10-CM

## 2020-01-24 ENCOUNTER — Ambulatory Visit (INDEPENDENT_AMBULATORY_CARE_PROVIDER_SITE_OTHER): Payer: Managed Care, Other (non HMO)

## 2020-01-24 DIAGNOSIS — I6300 Cerebral infarction due to thrombosis of unspecified precerebral artery: Secondary | ICD-10-CM | POA: Diagnosis not present

## 2020-01-24 LAB — CUP PACEART REMOTE DEVICE CHECK
Date Time Interrogation Session: 20211004095433
Implantable Pulse Generator Implant Date: 20210525

## 2020-01-25 ENCOUNTER — Ambulatory Visit: Payer: Managed Care, Other (non HMO) | Admitting: Physical Therapy

## 2020-01-27 NOTE — Progress Notes (Signed)
Carelink Summary Report / Loop Recorder 

## 2020-02-01 ENCOUNTER — Ambulatory Visit
Admission: RE | Admit: 2020-02-01 | Discharge: 2020-02-01 | Disposition: A | Discharge status: Home / Self Care | Payer: Managed Care, Other (non HMO) | Source: Ambulatory Visit | Attending: Internal Medicine | Admitting: Internal Medicine

## 2020-02-01 ENCOUNTER — Other Ambulatory Visit: Payer: Self-pay

## 2020-02-01 DIAGNOSIS — Z1231 Encounter for screening mammogram for malignant neoplasm of breast: Secondary | ICD-10-CM

## 2020-02-15 ENCOUNTER — Telehealth: Payer: Self-pay

## 2020-02-15 DIAGNOSIS — E785 Hyperlipidemia, unspecified: Secondary | ICD-10-CM

## 2020-02-15 MED ORDER — LABETALOL HCL 100 MG PO TABS
100.0000 mg | ORAL_TABLET | Freq: Three times a day (TID) | ORAL | 0 refills | Status: DC
Start: 2020-02-15 — End: 2020-04-11

## 2020-02-15 MED ORDER — AMLODIPINE BESYLATE 10 MG PO TABS
10.0000 mg | ORAL_TABLET | Freq: Every day | ORAL | 0 refills | Status: DC
Start: 2020-02-15 — End: 2020-04-11

## 2020-02-15 MED ORDER — ASPIRIN 81 MG PO CHEW: 81.0000 mg | CHEWABLE_TABLET | Freq: Every day | ORAL | 0 refills | Status: DC

## 2020-02-15 MED ORDER — LOSARTAN POTASSIUM 50 MG PO TABS: 50.0000 mg | ORAL_TABLET | Freq: Two times a day (BID) | ORAL | 0 refills | Status: DC

## 2020-02-15 NOTE — Telephone Encounter (Signed)
Refills sent

## 2020-02-26 LAB — CUP PACEART REMOTE DEVICE CHECK
Date Time Interrogation Session: 20211106095754
Implantable Pulse Generator Implant Date: 20210525

## 2020-02-27 ENCOUNTER — Ambulatory Visit (INDEPENDENT_AMBULATORY_CARE_PROVIDER_SITE_OTHER): Payer: Managed Care, Other (non HMO)

## 2020-02-27 DIAGNOSIS — I6300 Cerebral infarction due to thrombosis of unspecified precerebral artery: Secondary | ICD-10-CM

## 2020-02-28 NOTE — Progress Notes (Signed)
Carelink Summary Report / Loop Recorder 

## 2020-03-08 ENCOUNTER — Ambulatory Visit: Payer: Managed Care, Other (non HMO) | Attending: Gastroenterology | Admitting: Physical Therapy

## 2020-03-08 ENCOUNTER — Other Ambulatory Visit: Payer: Self-pay

## 2020-03-08 ENCOUNTER — Encounter: Payer: Self-pay | Admitting: Physical Therapy

## 2020-03-08 DIAGNOSIS — R278 Other lack of coordination: Secondary | ICD-10-CM | POA: Diagnosis present

## 2020-03-08 DIAGNOSIS — M6281 Muscle weakness (generalized): Secondary | ICD-10-CM | POA: Diagnosis present

## 2020-03-08 NOTE — Patient Instructions (Signed)
Toileting Techniques for Bowel Movements    An Evacuation/Defecation Plan   Here are the 4 basic points:  1. Lean forward enough for your elbows to rest on your knees 2. Support your feet on the floor or use a low stool if your feet don't touch the floor  3. Push out your belly as if you have swallowed a beach ball--you should feel a widening of your waist. "Belly Big, Belly Hard" 4. Open and relax your pelvic floor muscles, rather than tightening around the anus  While you are sitting on the toilet pay attention to the following areas: . Jaw and mouth position- relaxed not clenched . Angle of your hips - leaning slightly forward . Whether your feet touch the ground or not - should be flat and supported . Arm placement - rest against your thighs . Spine position - flat back . Waist . Breathing - exhale as you push (like blowing up a balloon or try using other sounds such as ahhhh, shhhhh, ohhhh or grrrrrrr) . Belly - hard and tight as you push . Anus (opening of the anal canal) - relaxed and open as you push . Anus - Tighten and lift pulling the muscle back in after you are done or if taking a break  If you are not successful after 10-15 minutes, try again later.  Avoid negative self-talk about your toileting experience.   Read this for more details and ask your PT if you need suggestions for adjustments or limitations:  1) Sitting on the toilet  a) Make sure your feet are supported - flat on the floor or step stool b) Many people find it effective to lean forward or raise their knees.  Propping your feet on a step stool (Squatty Potty is a brand name) can help the muscles around the anus to relax  c) When you lean forward, place your forearms on your thighs for support  2) Relaxing a) Breathe deeply and slowly in through your nose and out through your mouth. b) To become aware of how to relax your muscles, contracting and releasing muscles can be helpful.  Pull your pelvic floor  muscles in tightly by using the image of holding back gas, or closing around the anus (visualize making a circle smaller) and lifting the anus up and in.  Then release the muscles and your anus should drop down and feel open. Repeat 5 times ending with the feeling of relaxation. c) Keep your pelvic floor muscles relaxed; let your belly bulge out. d) The digestive tract starts at the mouth and ends at the anal opening, so be sure to relax both ends of the tube.  Place your tongue on the roof of your mouth with your teeth separated.  This helps relax your mouth and will help to relax the anus at the same time.  3) Emptying (defecation) a) Keep your pelvic floor and sphincter relaxed, then bulge your anal muscles.  Make the anal opening wide.  b) Stick your belly out as if you have swallowed a beach ball. c) Make your belly wall hard using your belly muscles while continuing to breathe. Doing this makes it easier to open your anus. d) Breath out and give a grunt (or try using other sounds such as ahhhh, shhhhh, ohhhh or grrrrrrr). e)  Can also try to act as if you are blowing up a balloon as you push  4) Finishing a) As you finish your bowel movement, pull the pelvic floor muscles   up and in.  This will leave your anus in the proper place rather than remaining pushed out and down. If you leave your anus pushed out and down, it will start to feel as though that is normal and give you incorrect signals about needing to have a bowel movement. [facility] [address], [address] [place], [place] [address] Phone # [phone] Fax [phone]  

## 2020-03-08 NOTE — Therapy (Signed)
[facility] [address] [place], [place], [address] Phone: [phone]   Fax:  [phone]  Physical Therapy Evaluation  Patient Details  Name: Belinda Banks MRN: [id number] Date of Birth: June 24, 1955 Referring Provider (PT): Dr. Thornton Park   Encounter Date: 03/08/2020   PT End of Session - 03/08/20 1218    Visit Number 1    Date for PT Re-Evaluation 05/31/20    Authorization Type cigna    PT Start Time 1145    PT Stop Time 1220    PT Time Calculation (min) 35 min    Activity Tolerance Patient tolerated treatment well    Behavior During Therapy [facility] for tasks assessed/performed           Past Medical History:  Diagnosis Date  . Cataract   . Chronic kidney disease   . Diabetes mellitus without complication ([place])    type 2  . Hyperlipidemia   . Hypertension    controlled  . Stroke [facility])    x 2 02/2019    Past Surgical History:  Procedure Laterality Date  . ABDOMINAL HYSTERECTOMY    . ANAL RECTAL MANOMETRY N/A 12/28/2019   Procedure: ANO RECTAL MANOMETRY;  Surgeon: Mauri Pole, MD;  Location: [facility] ENDOSCOPY;  Service: Endoscopy;  Laterality: N/A;  . BACK SURGERY    . BUBBLE STUDY  03/14/2019   Procedure: BUBBLE STUDY;  Surgeon: [name], MD;  Location: [facility] ENDOSCOPY;  Service: Cardiovascular;;  . COLONOSCOPY     approx 10 years at [place]  . TEE WITHOUT CARDIOVERSION N/A 03/14/2019   Procedure: TRANSESOPHAGEAL ECHOCARDIOGRAM (TEE);  Surgeon: [name] [name], MD;  Location: [facility] ENDOSCOPY;  Service: Cardiovascular;  Laterality: N/A;    There were no vitals filed for this visit.    Subjective Assessment - 03/08/20 1148    Subjective Patient has had 2 strokes last November. Patient is not able to empty her stool and wipes 5-6 times to clean herself. Since the stroke has 2 bowel movements per week. Get the urge for a bowel movement. One time in September had full fecal leakage.     Patient Stated Goals empty stool fully    Currently in Pain? No/denies              [facility] PT Assessment - 03/08/20 0001      Assessment   Medical Diagnosis K59.02 Dyssynergic Defecation    Referring Provider (PT) Dr. Thornton Park    Onset Date/Surgical Date --   02/2019     Precautions   Precautions None      Restrictions   Weight Bearing Restrictions No      Balance Screen   Has the patient fallen in the past 6 months No    Has the patient had a decrease in activity level because of a fear of falling?  No    Is the patient reluctant to leave their home because of a fear of falling?  No      Home Ecologist residence      Prior Function   Level of Independence Independent      Cognition   Overall Cognitive Status Within Functional Limits for tasks assessed      Observation/Other Assessments   Focus on Therapeutic Outcomes (FOTO)  [id number] 10%      Posture/Postural Control   Posture/Postural Control No significant limitations      ROM / Strength   AROM /  PROM / Strength AROM;PROM;Strength      AROM   Lumbar Extension decreased by 50%    Lumbar - Right Side Bend decreased by 50%    Lumbar - Left Side Bend decreased by 50%      PROM   Right Hip External Rotation  30    Left Hip External Rotation  50      Strength   Overall Strength Comments abdominal weakness and not knowing how to tighten the abdomen    Right Hip Extension 4/5    Right Hip ABduction 4/5    Left Hip Extension 4/5    Left Hip ABduction 4/5      Palpation   SI assessment  ASIS is equal    Palpation comment tightness in the lower abdomen;                       Objective measurements completed on examination: See above findings.     Pelvic Floor Special Questions - 03/08/20 0001    Prior Pregnancies Yes    Number of Vaginal Deliveries 2    Urinary Leakage No    Urinary urgency Yes    Fecal incontinence --   1 time in 12/2019   Skin  Integrity Hemorroids    Pelvic Floor Internal Exam Patient confirms identification and approves PT to assess pelvic floor and treatment    Exam Type Rectal    Palpation tightness in the anal sphincter    Strength weak squeeze, no lift   circular contraction           [facility] Adult PT Treatment/Exercise - 03/08/20 0001      Therapeutic Activites    Therapeutic Activities Other Therapeutic Activities    Other Therapeutic Activities instruction on correct toilting using a squatty potty, breathing correctly, and knees above hips                  PT Education - 03/08/20 1229    Education Details instruction on correct toileting    Person(s) Educated Patient    Methods Explanation;Demonstration;Handout    Comprehension Returned demonstration;Verbalized understanding            PT Short Term Goals - 03/08/20 1230      PT SHORT TERM GOAL #1   Title understand how to toilet correctly to expel her stool    Time 4    Period Weeks    Status New    Target Date 04/05/20             PT Long Term Goals - 03/08/20 1231      PT LONG TERM GOAL #1   Title able to contract her lower abdominals to assist in expelling the stool fully    Time 12    Period Weeks    Status New    Target Date 05/31/20      PT LONG TERM GOAL #2   Title able to fully relax her pelvic floor to fully expel her stool    Time 12    Period Weeks    Status New    Target Date 05/31/20      PT LONG TERM GOAL #3   Title understand how to perform abdominal massage to assist having more than 3 bowel movements in a weeks time    Time 12    Period Weeks    Status New    Target Date 05/31/20      PT LONG TERM  GOAL #4   Title increased right hip external rotation >/= 50 degrees passively to elongate the right pelvic floor muscles    Time 12    Period Weeks    Status New    Target Date 05/31/20                  Plan - 03/08/20 1224    Clinical Impression Statement Patient is a 64 year old  female with difficulty emptying her bowel since she had a stroke in 2020. Patient will have a bowel movement 2 times per year. She feels like she still has more stool left. Patient pelvic floor strength is 2/5 with a good hug but no lift. She has tigthness in the anal sphincter muscles. She was able to push the therapist finger out. She is not able to tighten her abdomen when asks. She has increased tightness in the lower abdominal area. Bilateral hip extension and abduction strength is 4/5. Patient will benefit from skilled therapy to improve muscle coordination to expel her stool to fully empty.    Personal Factors and Comorbidities Comorbidity 3+;Time since onset of injury/illness/exacerbation;Fitness    Comorbidities Stroke 2020; Diabetes; Abdominal Hysterectomy; Back and cervical surgery    Examination-Activity Limitations Toileting    Stability/Clinical Decision Making Stable/Uncomplicated    Clinical Decision Making Low    PT Frequency 1x / week    PT Duration 12 weeks    PT Treatment/Interventions ADLs/Self Care Home Management;Biofeedback;Therapeutic activities;Therapeutic exercise;Neuromuscular re-education;Manual techniques;Patient/family education    PT Next Visit Plan pelvic floor EMG for anal lift; manual work to abdomin to work on tightness and learning how to perfrom abdominal bracing, see how toileting is going    Consulted and Agree with Plan of Care Patient           Patient will benefit from skilled therapeutic intervention in order to improve the following deficits and impairments:  Decreased coordination, Increased fascial restricitons, Decreased strength, Decreased activity tolerance, Increased muscle spasms  Visit Diagnosis: Muscle weakness (generalized) - Plan: PT plan of care cert/re-cert  Other lack of coordination - Plan: PT plan of care cert/re-cert     Problem List Patient Active Problem List   Diagnosis Date Noted  . Dyssynergic defecation   . CVA  (cerebral vascular accident) ([place]) 03/11/2019  . Hypertensive emergency 03/09/2019  . Hyperlipidemia 03/09/2019  . DM (diabetes mellitus) ([place]) 03/07/2019  . HTN (hypertension) 03/07/2019  . Alcohol use 03/07/2019  . 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, [place], [place], [address] Phone: [phone]   Fax:  [phone]  Name: SHALEN PETRAK MRN: [id number] Date of Birth: 1955-07-20

## 2020-03-14 ENCOUNTER — Ambulatory Visit: Payer: Managed Care, Other (non HMO) | Admitting: Physical Therapy

## 2020-03-19 ENCOUNTER — Encounter: Payer: Self-pay | Admitting: Physical Therapy

## 2020-03-19 ENCOUNTER — Ambulatory Visit: Payer: Managed Care, Other (non HMO) | Admitting: Physical Therapy

## 2020-03-19 ENCOUNTER — Other Ambulatory Visit: Payer: Self-pay

## 2020-03-19 DIAGNOSIS — M6281 Muscle weakness (generalized): Secondary | ICD-10-CM

## 2020-03-19 DIAGNOSIS — R278 Other lack of coordination: Secondary | ICD-10-CM

## 2020-03-19 NOTE — Patient Instructions (Addendum)
About Abdominal Massage  Abdominal massage, also called external colon massage, is a self-treatment circular massage technique that can reduce and eliminate gas and ease constipation. The colon naturally contracts in waves in a clockwise direction starting from inside the right hip, moving up toward the ribs, across the belly, and down inside the left hip.  When you perform circular abdominal massage, you help stimulate your colon's normal wave pattern of movement called peristalsis.  It is most beneficial when done after eating.  Positioning You can practice abdominal massage with oil while lying down, or in the shower with soap.  Some people find that it is just as effective to do the massage through clothing while sitting or standing.  How to Massage Start by placing your finger tips or knuckles on your right side, just inside your hip bone.  . Make small circular movements while you move upward toward your rib cage.   . Once you reach the bottom right side of your rib cage, take your circular movements across to the left side of the bottom of your rib cage.  . Next, move downward until you reach the inside of your left hip bone.  This is the path your feces travel in your colon. . Continue to perform your abdominal massage in this pattern for 10 minutes each day.     You can apply as much pressure as is comfortable in your massage.  Start gently and build pressure as you continue to practice.  Notice any areas of pain as you massage; areas of slight pain may be relieved as you massage, but if you have areas of significant or intense pain, consult with your healthcare provider.  Other Considerations . General physical activity including bending and stretching can have a beneficial massage-like effect on the colon.  Deep breathing can also stimulate the colon because breathing deeply activates the same nervous system that supplies the colon.   . Abdominal massage should always be used in  combination with a bowel-conscious diet that is high in the proper type of fiber for you, fluids (primarily water), and a regular exercise program. .Toileting Techniques for Bowel Movements    An Evacuation/Defecation Plan   Here are the 4 basic points:  1. Lean forward enough for your elbows to rest on your knees 2. Support your feet on the floor or use a low stool if your feet don't touch the floor  3. Push out your belly as if you have swallowed a beach ball--you should feel a widening of your waist. "Belly Big, Belly Hard" 4. Open and relax your pelvic floor muscles, rather than tightening around the anus  While you are sitting on the toilet pay attention to the following areas: . Jaw and mouth position- relaxed not clenched . Angle of your hips - leaning slightly forward . Whether your feet touch the ground or not - should be flat and supported . Arm placement - rest against your thighs . Spine position - flat back . Waist . Breathing - exhale as you push (like blowing up a balloon or try using other sounds such as ahhhh, shhhhh, ohhhh or grrrrrrr) . Belly - hard and tight as you push . Anus (opening of the anal canal) - relaxed and open as you push . Anus - Tighten and lift pulling the muscle back in after you are done or if taking a break  If you are not successful after 10-15 minutes, try again later.  Avoid negative self-talk about  your toileting experience.   Read this for more details and ask your PT if you need suggestions for adjustments or limitations:  1) Sitting on the toilet  a) Make sure your feet are supported - flat on the floor or step stool b) Many people find it effective to lean forward or raise their knees.  Propping your feet on a step stool (Squatty Potty is a brand name) can help the muscles around the anus to relax  c) When you lean forward, place your forearms on your thighs for support  2) Relaxing a) Breathe deeply and slowly in through your nose and  out through your mouth. b) To become aware of how to relax your muscles, contracting and releasing muscles can be helpful.  Pull your pelvic floor muscles in tightly by using the image of holding back gas, or closing around the anus (visualize making a circle smaller) and lifting the anus up and in.  Then release the muscles and your anus should drop down and feel open. Repeat 5 times ending with the feeling of relaxation. c) Keep your pelvic floor muscles relaxed; let your belly bulge out. d) The digestive tract starts at the mouth and ends at the anal opening, so be sure to relax both ends of the tube.  Place your tongue on the roof of your mouth with your teeth separated.  This helps relax your mouth and will help to relax the anus at the same time.  3) Emptying (defecation) a) Keep your pelvic floor and sphincter relaxed, then bulge your anal muscles.  Make the anal opening wide.  b) Stick your belly out as if you have swallowed a beach ball. c) Make your belly wall hard using your belly muscles while continuing to breathe. Doing this makes it easier to open your anus. d) Breath out and give a grunt (or try using other sounds such as ahhhh, shhhhh, ohhhh or grrrrrrr). e)  Can also try to act as if you are blowing up a balloon as you push  4) Finishing a) As you finish your bowel movement, pull the pelvic floor muscles up and in.  This will leave your anus in the proper place rather than remaining pushed out and down. If you leave your anus pushed out and down, it will start to feel as though that is normal and give you incorrect signals about needing to have a bowel movement.  [place] [address] [place], [place] [address] Phone # [phone] Fax [phone]  Access Code: [email or web address] URL: [email or web address] Date: 03/19/2020 Prepared by: Earlie Counts  Exercises Supine Transversus Abdominis Bracing - Hands on Stomach - 1 x daily - 7 x weekly -  1 sets - 10 reps - 5 sec hold

## 2020-03-19 NOTE — Therapy (Signed)
[facility] [address], [place], [place], [address] Phone: [phone]   Fax:  [phone]  Physical Therapy Treatment  Patient Details  Name: Belinda Banks MRN: [id number] Date of Birth: 1956-03-20 Referring Provider (PT): Dr. Thornton Park   Encounter Date: 03/19/2020   PT End of Session - 03/19/20 1228    Visit Number 2    Date for PT Re-Evaluation 05/31/20    Authorization Type cigna    PT Start Time 1145    PT Stop Time 1223    PT Time Calculation (min) 38 min    Activity Tolerance Patient tolerated treatment well    Behavior During Therapy [facility] for tasks assessed/performed           Past Medical History:  Diagnosis Date  . Cataract   . Chronic kidney disease   . Diabetes mellitus without complication ([place])    type 2  . Hyperlipidemia   . Hypertension    controlled  . Stroke [facility])    x 2 02/2019    Past Surgical History:  Procedure Laterality Date  . ABDOMINAL HYSTERECTOMY    . ANAL RECTAL MANOMETRY N/A 12/28/2019   Procedure: ANO RECTAL MANOMETRY;  Surgeon: Mauri Pole, MD;  Location: [facility] ENDOSCOPY;  Service: Endoscopy;  Laterality: N/A;  . BACK SURGERY    . BUBBLE STUDY  03/14/2019   Procedure: BUBBLE STUDY;  Surgeon: [name], MD;  Location: [facility] ENDOSCOPY;  Service: Cardiovascular;;  . COLONOSCOPY     approx 10 years at [place]  . TEE WITHOUT CARDIOVERSION N/A 03/14/2019   Procedure: TRANSESOPHAGEAL ECHOCARDIOGRAM (TEE);  Surgeon: [name] [name], MD;  Location: [facility] ENDOSCOPY;  Service: Cardiovascular;  Laterality: N/A;    There were no vitals filed for this visit.   Subjective Assessment - 03/19/20 1151    Subjective I am fine. No changes. No fecal leakage since the last visit.No change with toileting. I used the stool this morning and may of helped.    Patient Stated Goals empty stool fully    Currently in Pain? No/denies                              [facility] Adult PT Treatment/Exercise - 03/19/20 0001      Lumbar Exercises: Supine   Ab Set 10 reps;5 seconds    AB Set Limitations with ball squeeze and tactile cues to engage the lower abdominals instead of bulging      Manual Therapy   Manual Therapy Myofascial release;Soft tissue mobilization    Manual therapy comments eduated patient on performing circular massage to abdomen for bowel movements    Soft tissue mobilization soft tissue work to the obliques, rectus abdominus; circular massage to the abdomen to promote the peristalic motion of the intestines    Myofascial Release the bladder, sac of [place], pubovesical ligaments, around the umbilicus                  PT Education - 03/19/20 1227    Education Details Access Code: [email or web address]; education on abdiominal massage; reviewed toileting technique    Person(s) Educated Patient    Methods Explanation;Demonstration;Verbal cues;Handout    Comprehension Returned demonstration;Verbalized understanding            PT Short Term Goals - 03/08/20 1230      PT SHORT TERM GOAL #1   Title understand how to toilet correctly to expel  her stool    Time 4    Period Weeks    Status New    Target Date 04/05/20             PT Long Term Goals - 03/08/20 1231      PT LONG TERM GOAL #1   Title able to contract her lower abdominals to assist in expelling the stool fully    Time 12    Period Weeks    Status New    Target Date 05/31/20      PT LONG TERM GOAL #2   Title able to fully relax her pelvic floor to fully expel her stool    Time 12    Period Weeks    Status New    Target Date 05/31/20      PT LONG TERM GOAL #3   Title understand how to perform abdominal massage to assist having more than 3 bowel movements in a weeks time    Time 12    Period Weeks    Status New    Target Date 05/31/20      PT LONG TERM GOAL #4   Title increased right hip external rotation >/= 50 degrees  passively to elongate the right pelvic floor muscles    Time 12    Period Weeks    Status New    Target Date 05/31/20                 Plan - 03/19/20 1152    Clinical Impression Statement Patient used the stool for toileting one time and noticed a little difference. Patient had good intestinals sounds after manual work. Patient understands how to perform abdominal massage for bowel movement. Patient will bulge the lower abdomen with contraction and needs verbal anc tactile cues to engage correctly. Patient has some tightness in the abdomen and after manual work it decreased. Patient will benefit from skilled therapy to improve muscle coordination to expel stool for full emptying.    Personal Factors and Comorbidities Comorbidity 3+;Time since onset of injury/illness/exacerbation;Fitness    Comorbidities Stroke 2020; Diabetes; Abdominal Hysterectomy; Back and cervical surgery    Examination-Activity Limitations Toileting    Stability/Clinical Decision Making Stable/Uncomplicated    PT Frequency 1x / week    PT Duration 12 weeks    PT Treatment/Interventions ADLs/Self Care Home Management;Biofeedback;Therapeutic activities;Therapeutic exercise;Neuromuscular re-education;Manual techniques;Patient/family education    PT Next Visit Plan pelvic floor EMG for anal lift; manual work to abdomin to work on tightness, go over breath for toileting    PT Home Exercise Plan Access Code: [email or web address]    Recommended Other Services MD signed note    Consulted and Agree with Plan of Care Patient           Patient will benefit from skilled therapeutic intervention in order to improve the following deficits and impairments:  Decreased coordination, Increased fascial restricitons, Decreased strength, Decreased activity tolerance, Increased muscle spasms  Visit Diagnosis: Muscle weakness (generalized)  Other lack of coordination     Problem List Patient Active Problem List   Diagnosis Date Noted   . Dyssynergic defecation   . CVA (cerebral vascular accident) ([place]) 03/11/2019  . Hypertensive emergency 03/09/2019  . Hyperlipidemia 03/09/2019  . DM (diabetes mellitus) ([place]) 03/07/2019  . HTN (hypertension) 03/07/2019  . Alcohol use 03/07/2019  . 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

## 2020-04-01 LAB — CUP PACEART REMOTE DEVICE CHECK
Date Time Interrogation Session: 20211209085821
Implantable Pulse Generator Implant Date: 20210525

## 2020-04-02 ENCOUNTER — Ambulatory Visit (INDEPENDENT_AMBULATORY_CARE_PROVIDER_SITE_OTHER): Payer: Managed Care, Other (non HMO)

## 2020-04-02 DIAGNOSIS — I6300 Cerebral infarction due to thrombosis of unspecified precerebral artery: Secondary | ICD-10-CM

## 2020-04-11 ENCOUNTER — Encounter: Payer: Self-pay | Admitting: Family

## 2020-04-11 ENCOUNTER — Other Ambulatory Visit: Payer: Self-pay

## 2020-04-11 ENCOUNTER — Other Ambulatory Visit: Payer: Self-pay | Admitting: Internal Medicine

## 2020-04-11 ENCOUNTER — Ambulatory Visit (INDEPENDENT_AMBULATORY_CARE_PROVIDER_SITE_OTHER): Payer: Managed Care, Other (non HMO) | Admitting: Family

## 2020-04-11 VITALS — BP 134/83 | HR 83 | Ht 68.27 in | Wt 168.8 lb

## 2020-04-11 DIAGNOSIS — I10 Essential (primary) hypertension: Secondary | ICD-10-CM | POA: Diagnosis not present

## 2020-04-11 DIAGNOSIS — E782 Mixed hyperlipidemia: Secondary | ICD-10-CM

## 2020-04-11 DIAGNOSIS — Z2821 Immunization not carried out because of patient refusal: Secondary | ICD-10-CM | POA: Diagnosis not present

## 2020-04-11 DIAGNOSIS — Z23 Encounter for immunization: Secondary | ICD-10-CM

## 2020-04-11 DIAGNOSIS — E1159 Type 2 diabetes mellitus with other circulatory complications: Secondary | ICD-10-CM

## 2020-04-11 DIAGNOSIS — E785 Hyperlipidemia, unspecified: Secondary | ICD-10-CM

## 2020-04-11 LAB — POCT GLYCOSYLATED HEMOGLOBIN (HGB A1C): Hemoglobin A1C: 5.1 % (ref 4.0–5.6)

## 2020-04-11 LAB — GLUCOSE, POCT (MANUAL RESULT ENTRY): POC Glucose: 93 mg/dl (ref 70–99)

## 2020-04-11 MED ORDER — AMLODIPINE BESYLATE 10 MG PO TABS: 10.0000 mg | ORAL_TABLET | Freq: Every day | ORAL | 0 refills | Status: DC

## 2020-04-11 MED ORDER — LABETALOL HCL 100 MG PO TABS: 100.0000 mg | ORAL_TABLET | Freq: Three times a day (TID) | ORAL | 0 refills | Status: DC

## 2020-04-11 NOTE — Progress Notes (Signed)
Diabetes f/u  Pt wants to discuss shingles vaccine

## 2020-04-11 NOTE — Patient Instructions (Addendum)
Continue Amlodipine and Labetalol for high blood pressure.   History of diabetes well-controlled, no medications needed for this at the moment.  Follow-up with primary physician in 3 months for chronic disease management.  Hypertension, Adult Hypertension is another name for high blood pressure. High blood pressure forces your heart to work harder to pump blood. This can cause problems over time. There are two numbers in a blood pressure reading. There is a top number (systolic) over a bottom number (diastolic). It is best to have a blood pressure that is below 120/80. Healthy choices can help lower your blood pressure, or you may need medicine to help lower it. What are the causes? The cause of this condition is not known. Some conditions may be related to high blood pressure. What increases the risk?  Smoking.  Having type 2 diabetes mellitus, high cholesterol, or both.  Not getting enough exercise or physical activity.  Being overweight.  Having too much fat, sugar, calories, or salt (sodium) in your diet.  Drinking too much alcohol.  Having long-term (chronic) kidney disease.  Having a family history of high blood pressure.  Age. Risk increases with age.  Race. You may be at higher risk if you are African American.  Gender. Men are at higher risk than women before age 16. After age 44, women are at higher risk than men.  Having obstructive sleep apnea.  Stress. What are the signs or symptoms?  High blood pressure may not cause symptoms. Very high blood pressure (hypertensive crisis) may cause: ? Headache. ? Feelings of worry or nervousness (anxiety). ? Shortness of breath. ? Nosebleed. ? A feeling of being sick to your stomach (nausea). ? Throwing up (vomiting). ? Changes in how you see. ? Very bad chest pain. ? Seizures. How is this treated?  This condition is treated by making healthy lifestyle changes, such as: ? Eating healthy foods. ? Exercising  more. ? Drinking less alcohol.  Your health care provider may prescribe medicine if lifestyle changes are not enough to get your blood pressure under control, and if: ? Your top number is above 130. ? Your bottom number is above 80.  Your personal target blood pressure may vary. Follow these instructions at home: Eating and drinking   If told, follow the DASH eating plan. To follow this plan: ? Fill one half of your plate at each meal with fruits and vegetables. ? Fill one fourth of your plate at each meal with whole grains. Whole grains include whole-wheat pasta, brown rice, and whole-grain bread. ? Eat or drink low-fat dairy products, such as skim milk or low-fat yogurt. ? Fill one fourth of your plate at each meal with low-fat (lean) proteins. Low-fat proteins include fish, chicken without skin, eggs, beans, and tofu. ? Avoid fatty meat, cured and processed meat, or chicken with skin. ? Avoid pre-made or processed food.  Eat less than 1,500 mg of salt each day.  Do not drink alcohol if: ? Your doctor tells you not to drink. ? You are pregnant, may be pregnant, or are planning to become pregnant.  If you drink alcohol: ? Limit how much you use to:  0-1 drink a day for women.  0-2 drinks a day for men. ? Be aware of how much alcohol is in your drink. In the [place] one drink equals one 12 oz bottle of beer (355 mL), one 5 oz glass of wine (148 mL), or one 1 oz glass of hard liquor (44 mL).  Lifestyle   Work with your doctor to stay at a healthy weight or to lose weight. Ask your doctor what the best weight is for you.  Get at least 30 minutes of exercise most days of the week. This may include walking, swimming, or biking.  Get at least 30 minutes of exercise that strengthens your muscles (resistance exercise) at least 3 days a week. This may include lifting weights or doing Pilates.  Do not use any products that contain nicotine or tobacco, such as cigarettes, e-cigarettes,  and chewing tobacco. If you need help quitting, ask your doctor.  Check your blood pressure at home as told by your doctor.  Keep all follow-up visits as told by your doctor. This is important. Medicines  Take over-the-counter and prescription medicines only as told by your doctor. Follow directions carefully.  Do not skip doses of blood pressure medicine. The medicine does not work as well if you skip doses. Skipping doses also puts you at risk for problems.  Ask your doctor about side effects or reactions to medicines that you should watch for. Contact a doctor if you:  Think you are having a reaction to the medicine you are taking.  Have headaches that keep coming back (recurring).  Feel dizzy.  Have swelling in your ankles.  Have trouble with your vision. Get help right away if you:  Get a very bad headache.  Start to feel mixed up (confused).  Feel weak or numb.  Feel faint.  Have very bad pain in your: ? Chest. ? Belly (abdomen).  Throw up more than once.  Have trouble breathing. Summary  Hypertension is another name for high blood pressure.  High blood pressure forces your heart to work harder to pump blood.  For most people, a normal blood pressure is less than 120/80.  Making healthy choices can help lower blood pressure. If your blood pressure does not get lower with healthy choices, you may need to take medicine. This information is not intended to replace advice given to you by your health care provider. Make sure you discuss any questions you have with your health care provider. Document Revised: 12/16/2017 Document Reviewed: 12/16/2017 Elsevier Patient Education  2020 [employer].

## 2020-04-11 NOTE — Progress Notes (Signed)
Patient ID: Belinda Banks, female    DOB: 1955/06/19  MRN: [id number]  CC: Chronic Conditions Follow-Up  Subjective: Belinda Banks is a 64 y.o. female who presents for chronic conditions follow-up.  1. HYPERTENSION FOLLOW-UP: 09/30/2019: Visit with Dr. Juleen China. Blood pressure controlled and continued on current regimen.   04/11/2020: Currently taking: see medication list Have you taken your blood pressure medication today: _0  Yes _1  No  Med Adherence: _2  Yes    _3  No  Medication side effects: _4  Yes    _5  No Adherence with salt restriction: _6  Yes    _7  No Exercise: Yes _8  No _9   Home Monitoring?: _10  Yes    _11  No Monitoring Frequency: _12  Yes    _13  No Home BP results range: _14  Yes    _15  No Smoking _16  Yes _17  No SOB? _18  Yes    _19  No  Chest Pain?: _20  Yes    _21  No Leg swelling?: _22  Yes    _23  No Headaches?: _24  Yes    _25  No Dizziness? _26  Yes    _27  No  2. DIABETES TYPE 2 FOLLOW-UP: 09/30/2019: Visit with Dr. Juleen China. Glucose well controlled. If A1C remains below goal could discuss discontinuation of Metformin and proceed with trial of diet control.   04/11/2020: Last A1C: 5.1% on 04/11/2020 Are you fasting today: _28  Yes _29  No  Med Adherence:  _30  Yes    _31  No Medication side effects:  _32  Yes    _33  No Home Monitoring?  _34  Yes    _35  No Diet Adherence: _36  Yes    _37  No Exercise: _38  Yes    _39  No Numbness of the feet? _40  Yes    _41  No Retinopathy hx? _42  Yes    _43  No Last eye exam: 06/03/2019   3. CHOLESTEROL FOLLOW-UP: 09/30/2019: Visit with Dr. Juleen China. Patient with history of CVA on Lipitor for secondary prevention. Patient with history of ICH for which statin was held in the past. Confirmed with Neurology that safe to discontinue with that remote history. Plan to repeat LDL and optimize statin intensity as needed. Goal < 70.  04/11/2020: Last Lipid Panel results:  HDL  Date Value Ref Range Status  10/10/2019 58 >39 mg/dL Final   Triglycerides  Date  Value Ref Range Status  10/10/2019 81 0 - 149 mg/dL Final    Are you fasting today: _44  Yes _45  No Med Adherence: _46  Yes    _47  No  Medication side effects: _48  Yes    _49  No Muscle aches:  _50  Yes    _51  No  Patient Active Problem List   Diagnosis Date Noted  . Dyssynergic defecation   . CVA (cerebral vascular accident) ([place]) 03/11/2019  . Hypertensive emergency 03/09/2019  . Hyperlipidemia 03/09/2019  . DM (diabetes mellitus) ([place]) 03/07/2019  . HTN (hypertension) 03/07/2019  . Alcohol use 03/07/2019  . 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 amLODipine (NORVASC) 10 MG tablet Take 1 tablet (10 mg total) by mouth daily. 90 tablet 0  . aspirin 81 MG chewable tablet Chew 1 tablet (81 mg total) by mouth daily. 90 tablet 0  . labetalol (NORMODYNE) 100 MG tablet Take 1 tablet (100 mg total) by mouth 3 (three) times daily. 270 tablet 0  . losartan (COZAAR) 50 MG tablet Take 1 tablet (50 mg total) by mouth 2 (two)  times daily. 180 tablet 0  . Blood Glucose Monitoring Suppl (ONETOUCH VERIO) w/Device KIT Use 3 times daily before meals. (Patient not taking: Reported on 04/11/2020) 1 kit 0  . glucose blood test strip Use as instructed 3 times daily before meals (Patient not taking: Reported on 04/11/2020) 100 each 12  . Lancets (ONETOUCH ULTRASOFT) lancets Use as instructed 3 times daily before meals (Patient not taking: Reported on 04/11/2020) 100 each 12   No current facility-administered medications on file prior to visit.    Allergies  Allergen Reactions  . Codeine Other (See Comments)    Made her loopy    Social History   Socioeconomic History  . Marital status: Married    Spouse name: Not on file  . Number of children: 2  . Years of education: Not on file  . Highest education level: Not on file  Occupational History  . Not on file  Tobacco  Use  . Smoking status: Former Smoker    Quit date: 10/05/1980    Years since quitting: 39.5  . Smokeless tobacco: Never Used  Substance and Sexual Activity  . Alcohol use: Yes    Alcohol/week: 1.0 standard drink    Types: 1 Glasses of wine per week    Comment: daily  . Drug use: Never  . Sexual activity: Not on file  Other Topics Concern  . Not on file  Social History Narrative  . Not on file   Social Determinants of Health   Financial Resource Strain: Not on file  Food Insecurity: Not on file  Transportation Needs: Not on file  Physical Activity: Not on file  Stress: Not on file  Social Connections: Not on file  Intimate Partner Violence: Not on file    Family History  Problem Relation Age of Onset  . Hypertension Mother   . Diabetes Father   . Hypertension Father   . Colon polyps Father   . Pancreatic cancer Sister   . Heart attack Maternal Grandmother   . Heart failure Maternal Grandmother   . Hypertension Maternal Grandmother   . Colon cancer Paternal Grandmother   . Heart attack Paternal Grandfather     Past Surgical History:  Procedure Laterality Date  . ABDOMINAL HYSTERECTOMY    . ANAL RECTAL MANOMETRY N/A 12/28/2019   Procedure: ANO RECTAL MANOMETRY;  Surgeon: Mauri Pole, MD;  Location: [facility] ENDOSCOPY;  Service: Endoscopy;  Laterality: N/A;  . BACK SURGERY    . BUBBLE STUDY  03/14/2019   Procedure: BUBBLE STUDY;  Surgeon: [name], MD;  Location: [facility] ENDOSCOPY;  Service: Cardiovascular;;  . COLONOSCOPY     approx 10 years at [place]  . TEE WITHOUT CARDIOVERSION N/A 03/14/2019   Procedure: TRANSESOPHAGEAL ECHOCARDIOGRAM (TEE);  Surgeon: [name], MD;  Location: [facility] ENDOSCOPY;  Service: Cardiovascular;  Laterality: N/A;    ROS: Review of Systems Negative except as stated above  PHYSICAL EXAM: BP 134/83 (BP Location: Left Arm, Patient Position: Sitting)   Pulse 83   Ht 5' 8.27" (1.734 m)   Wt 168 lb 12.8 oz (76.6 kg)   SpO2 99%    BMI 25.46 kg/m   Physical Exam Constitutional:      Appearance: Normal appearance.  Eyes:     Extraocular Movements: Extraocular movements intact.     Conjunctiva/sclera: Conjunctivae normal.     Pupils: Pupils are equal, round, and reactive to light.  Cardiovascular:     Rate and Rhythm: Normal rate and regular rhythm.  Pulses: Normal pulses.     Heart sounds: Normal heart sounds.  Pulmonary:     Effort: Pulmonary effort is normal.     Breath sounds: Normal breath sounds.  Neurological:     General: No focal deficit present.     Mental Status: She is alert and oriented to person, place, and time.  Psychiatric:        Mood and Affect: Mood normal.        Behavior: Behavior normal.     Results for orders placed or performed in visit on 04/11/20  POCT glycosylated hemoglobin (Hb A1C)  Result Value Ref Range   Hemoglobin A1C 5.1 4.0 - 5.6 %   HbA1c POC (<> result, manual entry)     HbA1c, POC (prediabetic range)     HbA1c, POC (controlled diabetic range)    Glucose (CBG)  Result Value Ref Range   POC Glucose 93 70 - 99 mg/dl   ASSESSMENT AND PLAN: 1. Essential hypertension: - Blood pressure close to goal during today's visit.  - Level of blood pressure control unknown as patient does not consistently check at home.  - Continue Amlodipine and Labetalol as prescribed. - Counseled on blood pressure goal of less than 130/80, low-sodium, DASH diet, medication compliance, 150 minutes of moderate intensity exercise per week as tolerated. Discussed medication compliance, adverse effects. - Follow-up with primary physician in 3 months or sooner if needed.  - BMP to check kidney function and electrolyte balance. Will return within 1 week to lab for this. - amLODipine (NORVASC) 10 MG tablet; Take 1 tablet (10 mg total) by mouth daily.  Dispense: 90 tablet; Refill: 0 - labetalol (NORMODYNE) 100 MG tablet; Take 1 tablet (100 mg total) by mouth 3 (three) times daily.  Dispense: 270  tablet; Refill: 0 - Basic Metabolic Panel  2. Type 2 diabetes mellitus with other circulatory complication, without long-term current use of insulin ([place]): - Hemoglobin A1C at goal today at 5.1%, goal < 7%. Previous hemoglobin A1C 5.1% on 10/10/2019. - Fasting CBG today in office, normal at 93.  - Discontinued from Metformin around June 2021 because of well-controlled diabetes. Will continue with same plan.  - Discussed the importance of healthy eating habits, low-carbohydrate diet, low-sugar diet, and regular aerobic exercise as tolerated to achieve or maintain control of diabetes.  - Follow-up with primary physician as scheduled.  - POCT glycosylated hemoglobin (Hb A1C) - Glucose (CBG)  3. Mixed hyperlipidemia: - Practice low-fat heart healthy diet and at moderate intensity exercise weekly as tolerated.  - Continue Atorvastatin as prescribed. Medication refilled prior to today's visit for 6 month supply. - Last lipid panel obtained 10/10/2019. Target LDL goal will remain < 70.  - Follow-up with primary physician as scheduled.  4. Influenza vaccine refused: - Declined flu vaccine today.  5. Need for tetanus, diphtheria, and acellular pertussis (Tdap) vaccine: - Declined Tdap vaccine today.  Patient was given the opportunity to ask questions.  Patient verbalized understanding of the plan and was able to repeat key elements of the plan. Patient was given clear instructions to go to Emergency Department or return to medical center if symptoms don't improve, worsen, or new problems develop.The patient verbalized understanding.   Orders Placed This Encounter  Procedures  . Basic Metabolic Panel  . POCT glycosylated hemoglobin (Hb A1C)  . Glucose (CBG)     Requested Prescriptions   Pending Prescriptions Disp Refills  . amLODipine (NORVASC) 10 MG tablet 90 tablet 0  Sig: Take 1 tablet (10 mg total) by mouth daily.  Marland Kitchen labetalol (NORMODYNE) 100 MG tablet 270 tablet 0    Sig: Take 1  tablet (100 mg total) by mouth 3 (three) times daily.    Return in about 3 months (around 07/10/2020) for Dr. Juleen China.  Camillia Herter, NP

## 2020-04-12 ENCOUNTER — Other Ambulatory Visit: Payer: Self-pay

## 2020-04-12 ENCOUNTER — Ambulatory Visit: Payer: Managed Care, Other (non HMO) | Attending: Gastroenterology | Admitting: Physical Therapy

## 2020-04-12 ENCOUNTER — Encounter: Payer: Self-pay | Admitting: Physical Therapy

## 2020-04-12 DIAGNOSIS — M6281 Muscle weakness (generalized): Secondary | ICD-10-CM | POA: Insufficient documentation

## 2020-04-12 DIAGNOSIS — R278 Other lack of coordination: Secondary | ICD-10-CM | POA: Insufficient documentation

## 2020-04-12 NOTE — Patient Instructions (Signed)
Access Code: [email or web address] URL: [email or web address] Date: 04/12/2020 Prepared by: Earlie Counts  Exercises Supine Transversus Abdominis Bracing - Hands on Stomach - 1 x daily - 7 x weekly - 1 sets - 10 reps - 5 sec hold Supine Diaphragmatic Breathing - 1 x daily - 7 x weekly - 1 sets - 10 reps [facility] [address] [place], [place] [address] Phone # [phone] Fax [phone]

## 2020-04-12 NOTE — Therapy (Signed)
[facility] [address], [place], [place], [address] Phone: [phone]   Fax:  [phone]  Physical Therapy Treatment  Patient Details  Name: Belinda Banks MRN: [id number] Date of Birth: [birth year] Referring Provider (PT): Dr. Thornton Park   Encounter Date: 04/12/2020   PT End of Session - 04/12/20 1144    Visit Number 3    Date for PT Re-Evaluation 05/31/20    Authorization Type cigna    PT Start Time 1100    PT Stop Time 1140    PT Time Calculation (min) 40 min    Activity Tolerance Patient tolerated treatment well    Behavior During Therapy [facility] for tasks assessed/performed           Past Medical History:  Diagnosis Date  . Cataract   . Chronic kidney disease   . Diabetes mellitus without complication ([place])    type 2  . Hyperlipidemia   . Hypertension    controlled  . Stroke [facility])    x 2 02/2019    Past Surgical History:  Procedure Laterality Date  . ABDOMINAL HYSTERECTOMY    . ANAL RECTAL MANOMETRY N/A 12/28/2019   Procedure: ANO RECTAL MANOMETRY;  Surgeon: Mauri Pole, MD;  Location: [facility] ENDOSCOPY;  Service: Endoscopy;  Laterality: N/A;  . BACK SURGERY    . BUBBLE STUDY  03/14/2019   Procedure: BUBBLE STUDY;  Surgeon: [name], MD;  Location: [facility] ENDOSCOPY;  Service: Cardiovascular;;  . COLONOSCOPY     approx 10 years at [place]  . TEE WITHOUT CARDIOVERSION N/A 03/14/2019   Procedure: TRANSESOPHAGEAL ECHOCARDIOGRAM (TEE);  Surgeon: [name] [name], MD;  Location: [facility] ENDOSCOPY;  Service: Cardiovascular;  Laterality: N/A;    There were no vitals filed for this visit.   Subjective Assessment - 04/12/20 1102    Subjective I have improved emptying my bowels by 50%.    Patient Stated Goals empty stool fully    Currently in Pain? No/denies              [facility] PT Assessment - 04/12/20 0001      Assessment   Medical Diagnosis K59.02 Dyssynergic Defecation    Referring  Provider (PT) Dr. Thornton Park    Onset Date/Surgical Date --   02/2019     PROM   Right Hip External Rotation  50    Left Hip External Rotation  50                         [facility] Adult PT Treatment/Exercise - 04/12/20 0001      Lumbar Exercises: Supine   Ab Set 10 reps;5 seconds    Other Supine Lumbar Exercises diaphragmatic breathing with expansion of the lower rib cage thne into the abdominal then to the pelvic floor to expand the tissue      Manual Therapy   Manual Therapy Myofascial release;Soft tissue mobilization;Joint mobilization    Joint Mobilization lower rib cage mobiiization to improve expansion    Soft tissue mobilization circular massage to the abdominal to improve peristalic motion of the intestines; soft tissue work to the diaphram to improve mobility    Myofascial Release fascial release of the lower abdominal, tissue rolling of the abdominal wall to release the fascia, release of the left upper quadrant                  PT Education - 04/12/20 1140  Education Details Access Code: [email or web address]    Person(s) Educated Patient    Methods Explanation;Verbal cues;Handout;Demonstration    Comprehension Verbalized understanding;Returned demonstration            PT Short Term Goals - 04/12/20 1104      PT SHORT TERM GOAL #1   Title understand how to toilet correctly to expel her stool    Time 4    Period Weeks    Status Achieved             PT Long Term Goals - 04/12/20 1105      PT LONG TERM GOAL #1   Title able to contract her lower abdominals to assist in expelling the stool fully    Time 12    Period Weeks    Status On-going      PT LONG TERM GOAL #2   Title able to fully relax her pelvic floor to fully expel her stool    Time 12    Period Weeks    Status On-going      PT LONG TERM GOAL #3   Title understand how to perform abdominal massage to assist having more than 3 bowel movements in a weeks time    Time 12     Period Weeks    Status On-going      PT LONG TERM GOAL #4   Title increased right hip external rotation >/= 50 degrees passively to elongate the right pelvic floor muscles    Time 12    Period Weeks    Status On-going                 Plan - 04/12/20 1144    Clinical Impression Statement Patient reports she is able to have a bowel movement with 50% greater ease. Patient has increased tightness of the right diaphragm but after manual work increased mobility. Patient is now able to move the lower rib cage into the abdomen and pelvic floor since the tissue is able to move better. Patient is now able to tighten the lower abdominals with greater ease. Patient has softness in the abdomen after the manual work. Patient will benefit from skilled therapy to improve muscle coordination to expel stool for full emptying.    Personal Factors and Comorbidities Comorbidity 3+;Time since onset of injury/illness/exacerbation;Fitness    Comorbidities Stroke 2020; Diabetes; Abdominal Hysterectomy; Back and cervical surgery    Examination-Activity Limitations Toileting    Stability/Clinical Decision Making Stable/Uncomplicated    PT Frequency 1x / week    PT Duration 12 weeks    PT Treatment/Interventions ADLs/Self Care Home Management;Biofeedback;Therapeutic activities;Therapeutic exercise;Neuromuscular re-education;Manual techniques;Patient/family education    PT Next Visit Plan sit on ball and massage pelvic floor; quadruped with pelvic floor release, add dead bug for abdominal exercise    PT Home Exercise Plan Access Code: [email or web address]    Consulted and Agree with Plan of Care Patient           Patient will benefit from skilled therapeutic intervention in order to improve the following deficits and impairments:  Decreased coordination,Increased fascial restricitons,Decreased strength,Decreased activity tolerance,Increased muscle spasms  Visit Diagnosis: Muscle weakness (generalized)  Other lack of  coordination     Problem List Patient Active Problem List   Diagnosis Date Noted  . Dyssynergic defecation   . CVA (cerebral vascular accident) ([place]) 03/11/2019  . Hypertensive emergency 03/09/2019  . Hyperlipidemia 03/09/2019  . DM (diabetes mellitus) ([place]) 03/07/2019  . HTN (hypertension) 03/07/2019  . Alcohol  use 03/07/2019  . 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, [place], [place], [address] Phone: [phone]   Fax:  [phone]  Name: JAMES BURROW MRN: [id number] Date of Birth: January 14, 1956

## 2020-04-17 NOTE — Progress Notes (Signed)
Carelink Summary Report / Loop Recorder 

## 2020-04-25 ENCOUNTER — Ambulatory Visit: Payer: Managed Care, Other (non HMO) | Attending: Gastroenterology | Admitting: Physical Therapy

## 2020-04-25 ENCOUNTER — Other Ambulatory Visit: Payer: Self-pay

## 2020-04-25 ENCOUNTER — Encounter: Payer: Self-pay | Admitting: Physical Therapy

## 2020-04-25 DIAGNOSIS — M6281 Muscle weakness (generalized): Secondary | ICD-10-CM | POA: Insufficient documentation

## 2020-04-25 DIAGNOSIS — R278 Other lack of coordination: Secondary | ICD-10-CM | POA: Diagnosis present

## 2020-04-25 NOTE — Therapy (Signed)
[facility] [address], [address] [place], [place], [address] Phone: [phone]   Fax:  [phone]  Physical Therapy Treatment  Patient Details  Name: Belinda Banks MRN: [id number] Date of Birth: [birth year] Referring Provider (PT): Dr. Tressia Danas   Encounter Date: 04/25/2020   PT End of Session - 04/25/20 1311    Visit Number 4    Date for PT Re-Evaluation 05/31/20    Authorization Type cigna    PT Start Time 1230    PT Stop Time 1310    PT Time Calculation (min) 40 min    Activity Tolerance Patient tolerated treatment well    Behavior During Therapy [facility] for tasks assessed/performed           Past Medical History:  Diagnosis Date  . Cataract   . Chronic kidney disease   . Diabetes mellitus without complication (HCC)    type 2  . Hyperlipidemia   . Hypertension    controlled  . Stroke [facility])    x 2 02/2019    Past Surgical History:  Procedure Laterality Date  . ABDOMINAL HYSTERECTOMY    . ANAL RECTAL MANOMETRY N/A 12/28/2019   Procedure: ANO RECTAL MANOMETRY;  Surgeon: Napoleon Form, MD;  Location: [facility] ENDOSCOPY;  Service: Endoscopy;  Laterality: N/A;  . BACK SURGERY    . BUBBLE STUDY  03/14/2019   Procedure: BUBBLE STUDY;  Surgeon: Vesta Mixer, MD;  Location: [facility] ENDOSCOPY;  Service: Cardiovascular;;  . COLONOSCOPY     approx 10 years at [facility]  . TEE WITHOUT CARDIOVERSION N/A 03/14/2019   Procedure: TRANSESOPHAGEAL ECHOCARDIOGRAM (TEE);  Surgeon: Elease Hashimoto Deloris Ping, MD;  Location: [facility] ENDOSCOPY;  Service: Cardiovascular;  Laterality: N/A;    There were no vitals filed for this visit.   Subjective Assessment - 04/25/20 1232    Subjective I have not had a bowel movement in 4 days. When she goes to the bathroom it is easier to go.    Patient Stated Goals empty stool fully    Currently in Pain? No/denies                             [facility] Adult PT Treatment/Exercise -  04/25/20 0001      Self-Care   Self-Care Other Self-Care Comments    Other Self-Care Comments  drinking more water, add magnesium to her diet, types of fiber to introduce for improved stool      Neuro Re-ed    Neuro Re-ed Details  diaphragmatic breathing to expand the lower rib cage and into the stomach then pelvic floor with tactile cues to the rib cage      Manual Therapy   Manual Therapy Soft tissue mobilization;Myofascial release    Manual therapy comments educated on using a tennis ball to sit on and roll it on the pelvic floor muscles to relax them for a bowel movement    Soft tissue mobilization circular massage to the abdominal to improve peristalic motion of the intestines; soft tissue work to the diaphram to improve mobility    Myofascial Release fascial release of the lower abdominal, tissue rolling of the abdominal wall to release the fascia, release of the left upper quadrant                  PT Education - 04/25/20 1311    Education Details ways to add fiber into her diet, importance of drinking  water, massage pelvic floor with a tennis ball    Person(s) Educated Patient    Methods Explanation;Demonstration;Verbal cues;Handout    Comprehension Returned demonstration;Verbalized understanding            PT Short Term Goals - 04/12/20 1104      PT SHORT TERM GOAL #1   Title understand how to toilet correctly to expel her stool    Time 4    Period Weeks    Status Achieved             PT Long Term Goals - 04/25/20 1317      PT LONG TERM GOAL #1   Title able to contract her lower abdominals to assist in expelling the stool fully    Time 12    Period Weeks    Status On-going      PT LONG TERM GOAL #2   Title able to fully relax her pelvic floor to fully expel her stool    Time 12    Status On-going      PT LONG TERM GOAL #3   Title understand how to perform abdominal massage to assist having more than 3 bowel movements in a weeks time    Time 12     Period Weeks    Status Achieved      PT LONG TERM GOAL #4   Title increased right hip external rotation >/= 50 degrees passively to elongate the right pelvic floor muscles    Time 12    Period Weeks    Status On-going                 Plan - 04/25/20 1312    Clinical Impression Statement Patient is not drinking enough water so she is going to try to add more to ther diet. Patient will be adding more fiber and magnesium to improve stool movement. She is able to push the stool out easier. She has not had a bowel movement in 4 days. Patient had improved bowel sounds after manual work. Patient still has difficulty with diaphragmatic breathing to expand the pelvic floor. Patient will benefit from skilled therapy to improve muscle coordination to expel stool for full emptying.    Personal Factors and Comorbidities Comorbidity 3+;Time since onset of injury/illness/exacerbation;Fitness    Comorbidities Stroke 2020; Diabetes; Abdominal Hysterectomy; Back and cervical surgery    Examination-Activity Limitations Toileting    Stability/Clinical Decision Making Stable/Uncomplicated    Rehab Potential Excellent    PT Frequency 1x / week    PT Duration 12 weeks    PT Treatment/Interventions ADLs/Self Care Home Management;Biofeedback;Therapeutic activities;Therapeutic exercise;Neuromuscular re-education;Manual techniques;Patient/family education    PT Next Visit Plan see if she is continue, abdomianl fascial work, quadruped pelvic floor release    PT Home Exercise Plan Access Code: [email or web address]    Consulted and Agree with Plan of Care Patient           Patient will benefit from skilled therapeutic intervention in order to improve the following deficits and impairments:  Decreased coordination,Increased fascial restricitons,Decreased strength,Decreased activity tolerance,Increased muscle spasms  Visit Diagnosis: Muscle weakness (generalized)  Other lack of coordination     Problem  List Patient Active Problem List   Diagnosis Date Noted  . Dyssynergic defecation   . CVA (cerebral vascular accident) (HCC) 03/11/2019  . Hypertensive emergency 03/09/2019  . Hyperlipidemia 03/09/2019  . DM (diabetes mellitus) (HCC) 03/07/2019  . HTN (hypertension) 03/07/2019  . Alcohol use 03/07/2019  . 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 [place], [place], [address] Phone: [phone]   Fax:  [phone]  Name: Belinda Banks MRN: [id number] Date of Birth: [birth year]

## 2020-04-25 NOTE — Patient Instructions (Addendum)
Introduction to Fiber Fiber Overview Dietary fiber is the part of plants that can't be digested. There are 2 kinds of dietary fiber: insoluble and soluble.  Insoluble fiber adds bulk to keep foods moving through the digestive system. Soluble fiber holds water, which softens the stool for easy bowel movements. Fiber is an important part of your diet, even though it passes through your body undigested and has no nutritional value. A high fiber diet can:  . promote regular bowel movements  . treat diverticular disease (inflammation of part of the intestine) and irritable bowel syndrome (abdominal pain, diarrhea, and constipation that come and go) . promote improvement in hemorrhoids, constipation and fecal incontinence  You should have at least 14 grams of fiber for every 1000 calories you eat every day. Read the label on every food package to find out how much fiber a serving of the food will provide. Foods containing more than 20% of the daily value of fiber per serving are considered high in fiber.  Without enough fiber in your diet, you may suffer from:  . constipation  . small, hard, dry bowel movements.   Sources of Fiber Breads, cereals, and pasta made with whole grain flour, and brown rice are high fiber foods. Many breakfast cereals list the bran or fiber content, so it's easy to know which products are high in fiber.  All fruits and vegetables also contain fiber. Dried beans, leafy vegetables, peas, raisins, prunes, apples, and citrus fruits are all especially good sources of fiber. Ask for examples of high-fiber foods (the fiber table and types of fiber handouts) for more resources on fiber.  Additional information on fiber content in foods, is available at www.caloriecounts.com   Types of Fiber  There are two main types of fiber:  insoluble and soluble.  Both of these types can prevent and relieve constipation and diarrhea, although some people find one or the other to be more easily  digested.  This handout details information about both types of fiber.  Insoluble Fiber       Functions of Insoluble Fiber . moves bulk through the intestines  . controls and balances the pH (acidity) in the intestines       Benefits of Insoluble Fiber . promotes regular bowel movement and prevents constipation  . removes fecal waste through colon in less time  . keeps an optimal pH in intestines to prevent microbes from producing cancer substances, therefore preventing colon cancer        Food Sources of Insoluble Fiber . whole-wheat products  . wheat bran "miller's bran" . corn bran  . flax seed  That is ground up or other chia seeds . vegetables such as green beans, broccoli, cauliflower and potato skins  . fruit skins and root vegetable skins  . popcorn . brown rice  Soluble Fiber       Functions of Soluble Fiber  . holds water in the colon to bulk and soften the stool . prolongs stomach emptying time so that sugar is released and absorbed more slowly        Benefits of Soluble Fiber . lowers total cholesterol and LDL cholesterol (the bad cholesterol) therefore reducing the risk of heart disease  . regulates blood sugar for people with diabetes       Food Sources of Soluble Fiber . oat/oat bran . dried beans and peas  . nuts  . barley  . flax seed or other seeds . fruits such as oranges, pears, peaches, and  apples  . vegetables such as carrots  . psyllium husk  . prunes  Add 400 milligrams of Magnesium per day  Drink half your body weight of water in ounces  [facility] [address], [address] [place], [place] [address] Phone # [phone] Fax [phone]

## 2020-05-02 ENCOUNTER — Encounter: Payer: Managed Care, Other (non HMO) | Admitting: Physical Therapy

## 2020-05-07 ENCOUNTER — Ambulatory Visit (INDEPENDENT_AMBULATORY_CARE_PROVIDER_SITE_OTHER): Payer: Managed Care, Other (non HMO)

## 2020-05-07 DIAGNOSIS — I6300 Cerebral infarction due to thrombosis of unspecified precerebral artery: Secondary | ICD-10-CM

## 2020-05-09 ENCOUNTER — Other Ambulatory Visit: Payer: Self-pay

## 2020-05-09 ENCOUNTER — Ambulatory Visit: Payer: Managed Care, Other (non HMO) | Admitting: Physical Therapy

## 2020-05-09 ENCOUNTER — Encounter: Payer: Self-pay | Admitting: Physical Therapy

## 2020-05-09 DIAGNOSIS — M6281 Muscle weakness (generalized): Secondary | ICD-10-CM | POA: Diagnosis not present

## 2020-05-09 DIAGNOSIS — R278 Other lack of coordination: Secondary | ICD-10-CM

## 2020-05-09 NOTE — Therapy (Signed)
[facility] [address] [place], [place], [address] Phone: [phone]   Fax:  [phone]  Physical Therapy Treatment  Patient Details  Name: Belinda Banks MRN: [id number] Date of Birth: 1955/12/20 Referring Provider (PT): Dr. Thornton Park   Encounter Date: 05/09/2020   PT End of Session - 05/09/20 1140    Visit Number 5    Date for PT Re-Evaluation 05/31/20    Authorization Type cigna    PT Start Time 1140    PT Stop Time 1220    PT Time Calculation (min) 40 min    Activity Tolerance Patient tolerated treatment well    Behavior During Therapy [facility] for tasks assessed/performed           Past Medical History:  Diagnosis Date  . Cataract   . Chronic kidney disease   . Diabetes mellitus without complication ([place])    type 2  . Hyperlipidemia   . Hypertension    controlled  . Stroke [facility])    x 2 02/2019    Past Surgical History:  Procedure Laterality Date  . ABDOMINAL HYSTERECTOMY    . ANAL RECTAL MANOMETRY N/A 12/28/2019   Procedure: ANO RECTAL MANOMETRY;  Surgeon: Mauri Pole, MD;  Location: [facility] ENDOSCOPY;  Service: Endoscopy;  Laterality: N/A;  . BACK SURGERY    . BUBBLE STUDY  03/14/2019   Procedure: BUBBLE STUDY;  Surgeon: [name], MD;  Location: [facility] ENDOSCOPY;  Service: Cardiovascular;;  . COLONOSCOPY     approx 10 years at [place]  . TEE WITHOUT CARDIOVERSION N/A 03/14/2019   Procedure: TRANSESOPHAGEAL ECHOCARDIOGRAM (TEE);  Surgeon: [name] [name], MD;  Location: [facility] ENDOSCOPY;  Service: Cardiovascular;  Laterality: N/A;    There were no vitals filed for this visit.   Subjective Assessment - 05/09/20 1140    Subjective The massage helps to have a bowel movement. I get the urge to have a bowel movement. I had a complete bowel movement last night. I take the benefiber one time per day.    Patient Stated Goals empty stool fully    Currently in Pain? No/denies                           Pelvic Floor Special Questions - 05/09/20 0001    Pelvic Floor Internal Exam Patient confirms identification and approves PT to assess pelvic floor and treatment    Exam Type Rectal    Strength good squeeze, good lift, able to hold agaisnt strong resistance    Strength # of reps 2    Strength # of seconds 10             [facility] Adult PT Treatment/Exercise - 05/09/20 0001      Self-Care   Self-Care Other Self-Care Comments    Other Self-Care Comments  discussed her taking the magnesium Citrate as requested by the doctor for constipation, she is to call to make suer it does not counteract with other medication      Neuro Re-ed    Neuro Re-ed Details  with therapist finger in the anal canal working on anal contraction with lift holding for 10 seconds then using diaphragmatic breath and grr sound to push the therapist finger out of the canal      Lumbar Exercises: Stretches   Passive Hamstring Stretch Right;Left;1 rep;30 seconds    Passive Hamstring Stretch Limitations supine with strap    ITB Stretch  Right;Left;1 rep;30 seconds    ITB Stretch Limitations supine with strap    Piriformis Stretch Right;Left;1 rep;30 seconds    Piriformis Stretch Limitations supine    Other Lumbar Stretch Exercise hip adductor stretch with strap, right, left supine 30 sec.      Manual Therapy   Manual Therapy Soft tissue mobilization;Internal Pelvic Floor    Soft tissue mobilization circular massage around the anus and perineal body to improve tissue mobility    Internal Pelvic Floor along the anterior anal sphincter and along the ischiococcygeus                  PT Education - 05/09/20 1224    Education Details Access Code: [email or web address]; use the magnesium citrate    Person(s) Educated Patient    Methods Explanation;Demonstration;Verbal cues    Comprehension Returned demonstration;Verbalized understanding            PT Short Term Goals - 04/12/20 1104       PT SHORT TERM GOAL #1   Title understand how to toilet correctly to expel her stool    Time 4    Period Weeks    Status Achieved             PT Long Term Goals - 05/09/20 1231      PT LONG TERM GOAL #1   Title able to contract her lower abdominals to assist in expelling the stool fully    Time 12    Period Weeks    Status On-going      PT LONG TERM GOAL #2   Title able to fully relax her pelvic floor to fully expel her stool    Time 12    Period Weeks    Status On-going      PT LONG TERM GOAL #3   Title understand how to perform abdominal massage to assist having more than 3 bowel movements in a weeks time    Time 12    Period Weeks    Status Achieved      PT LONG TERM GOAL #4   Title increased right hip external rotation >/= 50 degrees passively to elongate the right pelvic floor muscles    Time 12    Period Weeks    Status On-going                 Plan - 05/09/20 1228    Clinical Impression Statement Patient had tightness in the anterior anal sphincter and perineal body. After manual work internally and externally the patient anal strength increased to 4/5 holding for 10 seconds. Patient does better expelling the therapist finger outside the anal canal with grr sound. Patient is able to have a bowel movement when she uses the abdominal massage. Patient will be looking into the magnesium citrate to use in her diet to help with stools. Patient will benefit from skilled therapy to improve muscle coordination to expel stool for full emptying.    Personal Factors and Comorbidities Comorbidity 3+;Time since onset of injury/illness/exacerbation;Fitness    Comorbidities Stroke 2020; Diabetes; Abdominal Hysterectomy; Back and cervical surgery    Examination-Activity Limitations Toileting    Stability/Clinical Decision Making Stable/Uncomplicated    Rehab Potential Excellent    PT Frequency 1x / week    PT Duration 12 weeks    PT Treatment/Interventions ADLs/Self Care  Home Management;Biofeedback;Therapeutic activities;Therapeutic exercise;Neuromuscular re-education;Manual techniques;Patient/family education    PT Next Visit Plan see if having bowel movement are doing better, abdominal massage, core  strength; check hip ER P/ROM    PT Home Exercise Plan Access Code: [email or web address]    Consulted and Agree with Plan of Care Patient           Patient will benefit from skilled therapeutic intervention in order to improve the following deficits and impairments:  Decreased coordination,Increased fascial restricitons,Decreased strength,Decreased activity tolerance,Increased muscle spasms  Visit Diagnosis: Muscle weakness (generalized)  Other lack of coordination     Problem List Patient Active Problem List   Diagnosis Date Noted  . Dyssynergic defecation   . CVA (cerebral vascular accident) ([place]) 03/11/2019  . Hypertensive emergency 03/09/2019  . Hyperlipidemia 03/09/2019  . DM (diabetes mellitus) ([place]) 03/07/2019  . HTN (hypertension) 03/07/2019  . Alcohol use 03/07/2019  . 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, [place], [place], [address] Phone: [phone]   Fax:  [phone]  Name: Belinda Banks MRN: [id number] Date of Birth: 1955-10-18

## 2020-05-09 NOTE — Patient Instructions (Addendum)
Magnesium Citrate 320-400 mg Drink a warm liquid in the morning Abdominal massage daily  Access Code: [email or web address] URL: [email or web address] Date: 05/09/2020 Prepared by: Earlie Counts  Program Notes  Magnesium Citrate 320-400 mg Drink a warm liquid in the morning Abdominal massage daily    Exercises Supine Transversus Abdominis Bracing - Hands on Stomach - 1 x daily - 7 x weekly - 1 sets - 10 reps - 5 sec hold Supine Diaphragmatic Breathing - 1 x daily - 7 x weekly - 1 sets - 10 reps Supine Hamstring Stretch with Strap - 1 x daily - 7 x weekly - 1 sets - 2 reps - 30 sec hold Hip Adductors and Hamstring Stretch with Strap - 1 x daily - 7 x weekly - 1 sets - 2 reps - 30 sec hold Supine ITB Stretch with Strap - 1 x daily - 7 x weekly - 1 sets - 2 reps - 30 sec hold Supine Figure 4 Piriformis Stretch - 1 x daily - 7 x weekly - 1 sets - 2 reps - 30 sec hold [facility] [address] [place], [place] [address] Phone # [phone] Fax [phone]

## 2020-05-10 ENCOUNTER — Telehealth: Payer: Managed Care, Other (non HMO) | Admitting: Physician Assistant

## 2020-05-10 LAB — CUP PACEART REMOTE DEVICE CHECK
Date Time Interrogation Session: 20220111090445
Implantable Pulse Generator Implant Date: 20210525

## 2020-05-13 ENCOUNTER — Other Ambulatory Visit: Payer: Self-pay | Admitting: Internal Medicine

## 2020-05-14 ENCOUNTER — Telehealth: Payer: Managed Care, Other (non HMO) | Admitting: Physician Assistant

## 2020-05-16 ENCOUNTER — Encounter: Payer: Managed Care, Other (non HMO) | Admitting: Physical Therapy

## 2020-05-17 ENCOUNTER — Encounter: Payer: Self-pay | Admitting: Physical Therapy

## 2020-05-17 ENCOUNTER — Ambulatory Visit: Payer: Managed Care, Other (non HMO) | Admitting: Physical Therapy

## 2020-05-17 ENCOUNTER — Other Ambulatory Visit: Payer: Self-pay

## 2020-05-17 ENCOUNTER — Other Ambulatory Visit: Payer: Self-pay | Admitting: Internal Medicine

## 2020-05-17 DIAGNOSIS — M6281 Muscle weakness (generalized): Secondary | ICD-10-CM

## 2020-05-17 DIAGNOSIS — R278 Other lack of coordination: Secondary | ICD-10-CM

## 2020-05-17 NOTE — Patient Instructions (Signed)
Access Code: [email or web address] URL: [email or web address] Date: 05/17/2020 Prepared by: Earlie Counts  Program Notes  Magnesium Citrate 320-400 mg Drink a warm liquid in the morning Abdominal massage daily    Exercises Supine Transversus Abdominis Bracing - Hands on Stomach - 1 x daily - 7 x weekly - 1 sets - 10 reps - 5 sec hold Supine Diaphragmatic Breathing - 1 x daily - 7 x weekly - 1 sets - 10 reps Supine Hamstring Stretch with Strap - 1 x daily - 7 x weekly - 1 sets - 2 reps - 30 sec hold Hip Adductors and Hamstring Stretch with Strap - 1 x daily - 7 x weekly - 1 sets - 2 reps - 30 sec hold Supine ITB Stretch with Strap - 1 x daily - 7 x weekly - 1 sets - 2 reps - 30 sec hold Supine Figure 4 Piriformis Stretch - 1 x daily - 7 x weekly - 1 sets - 2 reps - 30 sec hold Bent Knee Fallouts - 1 x daily - 7 x weekly - 2 sets - 10 reps Hooklying Isometric Hip Flexion with Opposite Arm - 1 x daily - 7 x weekly - 2 sets - 10 reps - 3 sec hold Supine March - 1 x daily - 7 x weekly - 2 sets - 10 reps [facility] [address] [place], [place] [address] Phone # [phone] Fax [phone]

## 2020-05-17 NOTE — Therapy (Addendum)
[facility] [address] [place], [place], [address] Phone: [phone]   Fax:  [phone]  Physical Therapy Treatment  Patient Details  Name: Belinda Banks MRN: [id number] Date of Birth: 1955-07-18 Referring Provider (PT): Dr. Thornton Park   Encounter Date: 05/17/2020   PT End of Session - 05/17/20 1144    Visit Number 6    Date for PT Re-Evaluation 05/31/20    Authorization Type cigna    PT Start Time 1100    PT Stop Time 1140    PT Time Calculation (min) 40 min    Activity Tolerance Patient tolerated treatment well    Behavior During Therapy [facility] for tasks assessed/performed           Past Medical History:  Diagnosis Date  . Cataract   . Chronic kidney disease   . Diabetes mellitus without complication ([place])    type 2  . Hyperlipidemia   . Hypertension    controlled  . Stroke [facility])    x 2 02/2019    Past Surgical History:  Procedure Laterality Date  . ABDOMINAL HYSTERECTOMY    . ANAL RECTAL MANOMETRY N/A 12/28/2019   Procedure: ANO RECTAL MANOMETRY;  Surgeon: Mauri Pole, MD;  Location: [facility] ENDOSCOPY;  Service: Endoscopy;  Laterality: N/A;  . BACK SURGERY    . BUBBLE STUDY  03/14/2019   Procedure: BUBBLE STUDY;  Surgeon: [name], MD;  Location: [facility] ENDOSCOPY;  Service: Cardiovascular;;  . COLONOSCOPY     approx 10 years at [place]  . TEE WITHOUT CARDIOVERSION N/A 03/14/2019   Procedure: TRANSESOPHAGEAL ECHOCARDIOGRAM (TEE);  Surgeon: [name] [name], MD;  Location: [facility] ENDOSCOPY;  Service: Cardiovascular;  Laterality: N/A;    There were no vitals filed for this visit.   Subjective Assessment - 05/17/20 1059    Subjective I had a complete bowel movement this morning.    Patient Stated Goals empty stool fully    Currently in Pain? No/denies              [facility] PT Assessment - 05/17/20 0001      Assessment   Medical Diagnosis K59.02 Dyssynergic Defecation     Referring Provider (PT) Dr. Thornton Park    Onset Date/Surgical Date --   02/2019     Precautions   Precautions None      Home Environment   Living Environment Private residence      Prior Function   Level of Independence Independent      Cognition   Overall Cognitive Status Within Functional Limits for tasks assessed      PROM   Right Hip External Rotation  50   prior to manual mobilization was 40 degrees   Left Hip External Rotation  60                         [facility] Adult PT Treatment/Exercise - 05/17/20 0001      Lumbar Exercises: Stretches   Piriformis Stretch Right;Left;1 rep;30 seconds    Piriformis Stretch Limitations supine pushing on the knee      Lumbar Exercises: Supine   Clam 10 reps    Clam Limitations each side with abdominal bracing    Bent Knee Raise 20 reps;1 second    Bent Knee Raise Limitations with abdominal bracing    Other Supine Lumbar Exercises isometric contraction with hip adduction pushing on the inside of the knee  Manual Therapy   Manual Therapy Joint mobilization;Soft tissue mobilization;Myofascial release    Manual therapy comments passively stretched the joint afterwards    Joint Mobilization using the mulligan belt to distract, inferior  glide, and lateral glide    Soft tissue mobilization abdominal circular massage to promote peristalic motion of the intestines    Myofascial Release tissue rolling of the abdomen; motion to the lower abdominal to improve lymph drainage                  PT Education - 05/17/20 1142    Education Details Access Code: [email or web address]    Person(s) Educated Patient    Methods Explanation;Demonstration;Verbal cues;Handout    Comprehension Returned demonstration;Verbalized understanding            PT Short Term Goals - 04/12/20 1104      PT SHORT TERM GOAL #1   Title understand how to toilet correctly to expel her stool    Time 4    Period Weeks    Status Achieved              PT Long Term Goals - 05/17/20 1101      PT LONG TERM GOAL #1   Title able to contract her lower abdominals to assist in expelling the stool fully    Time 12    Period Weeks    Status Achieved      PT LONG TERM GOAL #2   Title able to fully relax her pelvic floor to fully expel her stool    Time 12    Period Weeks    Status Achieved      PT LONG TERM GOAL #3   Title understand how to perform abdominal massage to assist having more than 3 bowel movements in a weeks time    Time 12    Period Weeks    Status Achieved      PT LONG TERM GOAL #4   Title increased right hip external rotation >/= 50 degrees passively to elongate the right pelvic floor muscles    Time 12    Period Weeks    Status Achieved                 Plan - 05/17/20 1144    Clinical Impression Statement Patient is able to have a full bowel movement every 2-3 days. Patient straining to have a bowel movement has reduced to 70% improvement. Patient has increased right hip external rotation passively to 50 degrees. Patient has met all of her goals. She is able to engage her abdominal muscles fully. Patient is doing her abdominal massage daily. Patient is independent with her HEP and ready for discharge.    Personal Factors and Comorbidities Comorbidity 3+;Time since onset of injury/illness/exacerbation;Fitness    Comorbidities Stroke 2020; Diabetes; Abdominal Hysterectomy; Back and cervical surgery    Examination-Activity Limitations Toileting    Stability/Clinical Decision Making Stable/Uncomplicated    Rehab Potential Excellent    PT Treatment/Interventions ADLs/Self Care Home Management;Biofeedback;Therapeutic activities;Therapeutic exercise;Neuromuscular re-education;Manual techniques;Patient/family education    PT Next Visit Plan Discharge to HEP    PT Home Exercise Plan Access Code: [email or web address]    Consulted and Agree with Plan of Care Patient           Patient will benefit from skilled therapeutic  intervention in order to improve the following deficits and impairments:  Decreased coordination,Increased fascial restricitons,Decreased strength,Decreased activity tolerance,Increased muscle spasms  Visit Diagnosis: Muscle weakness (generalized)  Other lack of  coordination     Problem List Patient Active Problem List   Diagnosis Date Noted  . Dyssynergic defecation   . CVA (cerebral vascular accident) ([place]) 03/11/2019  . Hypertensive emergency 03/09/2019  . Hyperlipidemia 03/09/2019  . DM (diabetes mellitus) ([place]) 03/07/2019  . HTN (hypertension) 03/07/2019  . Alcohol use 03/07/2019  . 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, [place], [place], [address] Phone: [phone]   Fax:  [phone]  Name: Belinda Banks MRN: [id number] Date of Birth: [birth year]  PHYSICAL THERAPY DISCHARGE SUMMARY  Visits from Start of Care: 6  Current functional level related to goals / functional outcomes: See above.    Remaining deficits: See above.   Education / Equipment: HEP Plan: Patient agrees to discharge.  Patient goals were met. Patient is being discharged due to meeting the stated rehab goals.  Thank you for the referral. Earlie Counts, PT 05/17/20 12:46 PM  ?????

## 2020-05-22 NOTE — Progress Notes (Signed)
Carelink Summary Report / Loop Recorder 

## 2020-05-23 ENCOUNTER — Ambulatory Visit: Payer: Managed Care, Other (non HMO) | Admitting: Physical Therapy

## 2020-06-06 LAB — CUP PACEART REMOTE DEVICE CHECK
Date Time Interrogation Session: 20220213091002
Implantable Pulse Generator Implant Date: 20210525

## 2020-06-07 ENCOUNTER — Other Ambulatory Visit: Payer: Self-pay

## 2020-06-07 ENCOUNTER — Ambulatory Visit: Payer: Managed Care, Other (non HMO) | Admitting: Adult Health

## 2020-06-07 ENCOUNTER — Encounter: Payer: Self-pay | Admitting: Adult Health

## 2020-06-07 VITALS — BP 118/76 | HR 72 | Ht 69.0 in | Wt 176.0 lb

## 2020-06-07 DIAGNOSIS — I639 Cerebral infarction, unspecified: Secondary | ICD-10-CM | POA: Diagnosis not present

## 2020-06-07 DIAGNOSIS — I610 Nontraumatic intracerebral hemorrhage in hemisphere, subcortical: Secondary | ICD-10-CM

## 2020-06-07 NOTE — Patient Instructions (Signed)
Continue to pursue SSD as you would likely have great difficulty returning to work or performing majority of job functions  Continue aspirin 81 mg daily  and atorvastatin  for secondary stroke prevention  Continue to follow up with PCP regarding cholesterol and blood pressure management  Maintain strict control of hypertension with blood pressure goal below 130/90 and cholesterol with LDL cholesterol (bad cholesterol) goal below 70 mg/dL.       Followup in the future with me in 6 months or call earlier if needed       Thank you for coming to see [place] at [facility]. I hope we have been able to provide you high quality care today.  You may receive a patient satisfaction survey over the next few weeks. We would appreciate your feedback and comments so that we may continue to improve ourselves and the health of our patients.

## 2020-06-07 NOTE — Progress Notes (Signed)
[facility] [address] [place]. [place] [address] [phone]       STROKE FOLLOW UP NOTE  [name] Date of Birth:  May 12, 1955 Medical Record Number:  [id number]   Reason for Referral:  stroke follow up    CHIEF COMPLAINT:  Chief Complaint  Patient presents with  . Follow-up    Rm14 with husband (don) Pt is well, has a little tremor in L hand     HPI:  Today, 06/07/2020, Belinda Banks returns for 71-month stroke follow-up accompanied by her husband.   Reports residual of visual impairment, alexia, left hand tremor and cognitive impairment relatively stable. She continues to apply for SSD due to residual deficits -difficulty with reading, performing multistep tasks, processing deficit and multitasking Denies new stroke/TIA symptoms  Reports compliance with aspirin 81 mg daily and atorvastatin 40 mg daily Blood pressure 118/76 -monitors at home and typically stable She does not routinely check glucose levels as this has been stable - recent A1c 5.1 (03/2020) Loop recorder has not shown atrial fibrillation thus far  No concerns at this time   History provided for reference purposes only Update 12/06/2019 JM: Belinda Banks returns for stroke follow-up accompanied by her husband. Residual deficits of short-term memory impairment and visual impairment; she questions possible worsening vs being more aware of deficits  Prism glasses provided some benefit in regards to reading but continues to have difficulty with comprehension and understanding what she is reading.  She is not able to read at all without use of prism glasses. 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  Completed STD and only received LTD for 2 days - she has since retired and currently planning on applying for social security disability.  Denies new stroke/TIA symptoms Remains on aspirin 81 mg daily  and atorvastatin 40 mg daily for secondary stroke prevention without side effects Blood pressure today 120/84 Glucose levels have been stable -reports PCP discontinued metformin due to improvement of A1c HTN, HLD and DM managed by PCP Loop recorder placed on 09/22/2019 which has not shown atrial fibrillation thus far Other concerns She does report cluster periods of difficulty staying asleep does not have issue falling asleep She has trialed different types of melatonin but would only take intermittently as she did not find benefit Denies snoring, morning headaches, or witnessed apnea Occasional day time fatigue with occasional afternoon naps but denies excessive fatigue No further concerns at this time  Update 06/24/2019 JM: Belinda Banks is a 65 year old female who is being seen today, 07/14/2019, for stroke follow-up.  She continues to have residual visual difficulties and was evaluated by ophthalmology on 06/03/2019 which showed bilateral inferior altitudinal visual field defects as well as bilateral homonymous inferior quadrantanopia.  She was referred to low vision specialist at [facility] low vision center for further evaluation.  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  Hallucinations likely release hallucinations as they appeared on her blind areas - have improved since prior visit.  She does plan on follow-up with her life specialist in 3 months.  She does report mild improvement of vision but continues to have difficulty with reading and remembering numbers.  She also complains of short-term memory deficit.  She has completed outpatient therapy but continues to do exercises at home.  She continues on short-term disability due to  ongoing visual deficits. She did have follow-up with cardiology who recommended placement of loop recorder due to cryptogenic stroke etiology based on further  recommendations at today's visit.  Continues on aspirin 81 mg daily without bleeding or bruising.  Repeat LFTs normalized and recommended initiating atorvastatin 20 mg daily.  Recent repeat LFT by PCP remains WNL.  Blood pressure today 111/74.  No further concerns at this time.   Initial visit 04/20/2019 JM: Belinda Banks is a 65 year old female who is being seen today for hospital follow-up accompanied by her husband.  Residual deficits of visual impairment, cognitive impairment, aphasia and decreased right hand dexterity.  She has difficulty fully explaining visual impairment as she denies diplopia or blurred vision but describes it as distortion of images and words which is worsened if she is concentrating for prolonged period of time.  Vision will improve after closing and resting eyes.  She also endorses occasional visual hallucinations.  She has not had evaluation by ophthalmology at this time and is requesting evaluation by neuro-ophthalmology as recommended by therapy.  She continues to work with outpatient speech and OT with ongoing improvement.  She has not been able to return to work due to [employer] of job requires computer and paperwork.  She is currently receiving short-term disability from hospitalization and is requesting continuation of short-term disability through our office.  She has not established care with PCP at this time but does have initial appointment in approximately 1 month.  She also endorses occasional progressive symptoms of left hand numbness slowly progressing up arm and then will begin tremoring lasting 30 seconds to 1 minute.  Usually occurs after activity especially after shower.  She has continued on aspirin 81 mg daily without bleeding or bruising.  She has not had repeat LFTs as she does not have established PCP at this time therefore currently not on statin therapy.  Blood pressure today satisfactory at 117/79.  Review of vasculitis work-up all unremarkable.  She reports  decreasing EtOH use and has only had approximately 3 drinks since discharge.  No further concerns at this time.  Stroke admission 03/05/2019: BelindaNoemie H McBrayeris a 65 y.o.femalewith history of HTN(no recent medical f/u) who presented to [facility] ED as a code stroke with c/o slurred speech, right sided weakness, facial droop, BP 230/158 and glucose 238. CT head showed small ICH centered at the left lentiform nucleus estimated volume 3.8 cc felt secondary to hypertensive.  CTA head/neck unchanged size of hypertensive hemorrhage in left basal ganglia without evidence of aneurysm or AVM.  2D echo normal EF without cardiac source of embolus identified.  COVID-19 negative.  Hypertensive emergency on arrival treated with Cleviprex with increasing Cozaar, initiating labetalol and continuation of Norvasc.   Alcohol abuse 4-6 drinks per day placed on B1/FA/MVI and advised to drink no more than one alcoholic beverage per day.  New diagnosis of uncontrolled DM with A1c 7.6 and initiated Metformin 500 mg twice daily and advised to follow-up with PCP outpatient.  LDL 106 and initiated atorvastatin 20 mg daily.  Other stroke risk factors include former tobacco use and sedentary lifestyle.  Residual deficits of right facial droop, right pronator drift with decreased right hand dexterity and discharged home in stable condition with outpatient therapies.  Stroke admission 03/11/2019: She returned on 03/11/2019 with complaints of generalized weakness, nausea and slight confusion.  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  MRI  w/wo contrast showed multiple acute to early subacute cerebral infarcts bilaterally largest in posterior left MCA region, stable appearance of subacute hemorrhage, and moderate chronic small vessel ischemic disease with multiple chronic lacunar infarcts.  CTA  head and neck and 2D echo not repeated as performed on prior admission.  Underwent TEE and was negative for vegetations, embolus or PFO.  Discontinued atorvastatin due to elevated LFTs.  Evaluated by Dr. Leonie Man who felt initial infarct was hemorrhagic rather than primary intracerebral hemorrhage.  Recommended initiating aspirin 81 mg daily for stroke prevention.  Felt to be not a candidate for long-term anticoagulation at that time but may consider loop recorder placement in the future for further embolic work-up.  Vasculitis work-up pending at discharge.  Residual deficits of right peripheral partial field loss, mild right lower facial asymmetry, diminished fine finger movements on the right, mild weakness of right grip and mild right hip flexor and ankle dorsiflexion weakness.  Discharged home with recommendation of outpatient therapy.       ROS:   14 system review of systems performed and negative with exception of those listed in HPI  PMH:  Past Medical History:  Diagnosis Date  . Cataract   . Chronic kidney disease   . Diabetes mellitus without complication ([place])    type 2  . Hyperlipidemia   . Hypertension    controlled  . Stroke [facility])    x 2 02/2019    PSH:  Past Surgical History:  Procedure Laterality Date  . ABDOMINAL HYSTERECTOMY    . ANAL RECTAL MANOMETRY N/A 12/28/2019   Procedure: ANO RECTAL MANOMETRY;  Surgeon: Mauri Pole, MD;  Location: [facility] ENDOSCOPY;  Service: Endoscopy;  Laterality: N/A;  . BACK SURGERY    . BUBBLE STUDY  03/14/2019   Procedure: BUBBLE STUDY;  Surgeon: [name], MD;  Location: [facility] ENDOSCOPY;  Service: Cardiovascular;;  . COLONOSCOPY     approx 10 years at [facility]  . TEE WITHOUT CARDIOVERSION N/A 03/14/2019   Procedure: TRANSESOPHAGEAL ECHOCARDIOGRAM (TEE);  Surgeon: [name] [name], MD;  Location: [facility] ENDOSCOPY;  Service: Cardiovascular;  Laterality: N/A;    Social History:  Social History   Socioeconomic History  . Marital  status: Married    Spouse name: Not on file  . Number of children: 2  . Years of education: Not on file  . Highest education level: Not on file  Occupational History  . Not on file  Tobacco Use  . Smoking status: Former Smoker    Quit date: 10/05/1980    Years since quitting: 39.6  . Smokeless tobacco: Never Used  Substance and Sexual Activity  . Alcohol use: Yes    Alcohol/week: 1.0 standard drink    Types: 1 Glasses of wine per week    Comment: daily  . Drug use: Never  . Sexual activity: Not on file  Other Topics Concern  . Not on file  Social History Narrative  . Not on file   Social Determinants of Health   Financial Resource Strain: Not on file  Food Insecurity: Not on file  Transportation Needs: Not on file  Physical Activity: Not on file  Stress: Not on file  Social Connections: Not on file  Intimate Partner Violence: Not on file    Family History:  Family History  Problem Relation Age of Onset  . Hypertension Mother   . Diabetes Father   . Hypertension Father   . Colon polyps Father   . Pancreatic cancer Sister   .  Heart attack Maternal Grandmother   . Heart failure Maternal Grandmother   . Hypertension Maternal Grandmother   . Colon cancer Paternal Grandmother   . Heart attack Paternal Grandfather     Medications:   Current Outpatient Medications on File Prior to Visit  Medication Sig Dispense Refill  . amLODipine (NORVASC) 10 MG tablet Take 1 tablet (10 mg total) by mouth daily. 90 tablet 0  . atorvastatin (LIPITOR) 40 MG tablet TAKE 1 TABLET BY MOUTH EVERY DAY 90 tablet 1  . [facility] ASPIRIN ADULT LOW DOSE 81 MG chewable tablet CHEW 1 TABLET BY MOUTH DAILY. 90 tablet 1  . labetalol (NORMODYNE) 100 MG tablet Take 1 tablet (100 mg total) by mouth 3 (three) times daily. 270 tablet 0  . losartan (COZAAR) 50 MG tablet TAKE 1 TABLET BY MOUTH TWICE A DAY 180 tablet 0   No current facility-administered medications on file prior to visit.    Allergies:    Allergies  Allergen Reactions  . Codeine Other (See Comments)    Made her loopy     Physical Exam  Vitals:   06/07/20 0728  BP: 118/76  Pulse: 72  Weight: 176 lb (79.8 kg)  Height: 5\' 9"  (1.753 m)   Body mass index is 25.99 kg/m. No exam data present   General: well developed, well nourished,  pleasant middle-age Caucasian female, seated, in no evident distress Neck: supple with no carotid or supraclavicular bruits Cardiovascular: regular rate and rhythm, no murmurs Vascular:  Normal pulses all extremities   Neurologic Exam Mental Status: Awake and fully alert.  Fluent speech and language.  Oriented to place and time. Recent memory subjectively diminished and remote memory intact. Attention span, concentration and fund of knowledge appropriate during visit. Mood and affect appropriate.  Cranial Nerves: Pupils equal, briskly reactive to light. Extraocular movements full. Visual fields full to confrontation although slightly decreased vision b/l left lower periphery.  Hearing intact. Facial sensation intact. Face, tongue, palate moves normally and symmetrically.  Motor: Normal bulk and tone.  Full strength in all tested extremities Sensory.: intact to touch , pinprick , position and vibratory sensation.  Coordination: Rapid alternating movements normal in all extremities.  Finger-to-nose and heel-to-shin performed accurately bilaterally.  Slight left hand intention tremor - stable.  No tremor at rest, bradykinesia or cogwheel rigidity Gait and Station: Arises from chair without difficulty. Stance is normal. Gait demonstrates normal stride length and balance without use of assistive device Reflexes: 1+ and symmetric. Toes downgoing.         ASSESSMENT: DIANNIA HOGENSON is a 65 y.o. year old female with recent admission on 03/05/2019 for left basal ganglia hemorrhage felt at that time to be hypertensive in etiology and then returned on 03/11/2019 with generalized weakness,  nausea and slight confusion and found to have multiple additional acute and subacute bilateral cerebral infarcts embolic pattern secondary to unknown source.  Septic emboli and vasculitis ruled out.  Loop recorder placed on 09/22/2019 due to cryptogenic etiology.  Vascular risk factors include HTN, HLD, DM, history of EtOH use, new diagnosis DM.      PLAN:  1. Multiple strokes as above:  a. Residual deficits: Visual field deficit, visual spatial deficit, alexia and short-term memory impairment.  Use of prism glasses with only mild benefit in regards to reading. Continues to pursue Social Security disability due to residual deficits and inability to perform prior job functions and majority of other jobs adequately b. Continue aspirin 81 mg daily and atorvastatin  20 mg daily for secondary stroke prevention.  c. Loop recorder placed 09/22/2019: Has not shown atrial fibrillation thus far; continue to monitor by cardiology d. Discussed secondary stroke prevention measures and portance of close PCP follow-up for aggressive stroke risk factor management e. HTN: BP goal <130/90.  Stable.  Managed by PCP f. HLD: LDL goal <70.  Continue atorvastatin.  Managed and prescribed by PCP g. DMII: A1c goal <7.0. Recent A1c 5.1. A1c 7.6 1 yr ago. Not currently on pharmacological management    Follow up in 6 months or call earlier if needed   CC:  [facility] provider: Dr. Molli Hazard, Bayard Beaver, DO    I spent 30 minutes of face-to-face and non-face-to-face time with patient and husband.  This included previsit chart review, lab review, study review, order entry, electronic health record documentation, patient education regarding prior strokes, residual deficits, review and monitoring of loop recorder, importance of managing stroke risk factors, and answered all other questions to patient and husband satisfaction    Frann Rider, AGNP-BC  [facility] [address] [place], [place] [address]  Phone [phone] Fax [phone] Note: This document was prepared with digital dictation and possible smart phrase technology. Any transcriptional errors that result from this process are unintentional.

## 2020-06-07 NOTE — Progress Notes (Signed)
I agree with the above plan 

## 2020-06-11 ENCOUNTER — Ambulatory Visit (INDEPENDENT_AMBULATORY_CARE_PROVIDER_SITE_OTHER): Payer: Managed Care, Other (non HMO)

## 2020-06-11 DIAGNOSIS — I6300 Cerebral infarction due to thrombosis of unspecified precerebral artery: Secondary | ICD-10-CM | POA: Diagnosis not present

## 2020-06-19 NOTE — Progress Notes (Signed)
Carelink Summary Report / Loop Recorder 

## 2020-06-25 ENCOUNTER — Other Ambulatory Visit: Payer: Self-pay

## 2020-06-25 ENCOUNTER — Telehealth: Payer: Self-pay | Admitting: Internal Medicine

## 2020-06-25 DIAGNOSIS — E785 Hyperlipidemia, unspecified: Secondary | ICD-10-CM

## 2020-06-25 DIAGNOSIS — I10 Essential (primary) hypertension: Secondary | ICD-10-CM

## 2020-06-25 MED ORDER — AMLODIPINE BESYLATE 10 MG PO TABS: 10.0000 mg | ORAL_TABLET | Freq: Every day | ORAL | 0 refills | Status: DC

## 2020-06-25 MED ORDER — LABETALOL HCL 100 MG PO TABS: 100.0000 mg | ORAL_TABLET | Freq: Three times a day (TID) | ORAL | 0 refills | Status: DC

## 2020-06-25 MED ORDER — ATORVASTATIN CALCIUM 40 MG PO TABS: 40.0000 mg | ORAL_TABLET | Freq: Every day | ORAL | 1 refills | Status: DC

## 2020-06-25 MED ORDER — LOSARTAN POTASSIUM 50 MG PO TABS
50.0000 mg | ORAL_TABLET | Freq: Two times a day (BID) | ORAL | 0 refills | Status: DC
Start: 2020-06-25 — End: 2020-12-26

## 2020-06-25 MED ORDER — ASPIRIN 81 MG PO CHEW: CHEWABLE_TABLET | ORAL | 1 refills | Status: DC

## 2020-06-25 NOTE — Telephone Encounter (Signed)
RF sent.

## 2020-06-25 NOTE — Telephone Encounter (Signed)
1) Medication(s) Requested (by name):  losartan (COZAAR) 50 MG tablet  labetalol (NORMODYNE) 100 MG tablet amLODipine (NORVASC) 10 MG tablet  atorvastatin (LIPITOR) 40 MG tablet  [facility] ASPIRIN ADULT LOW DOSE 81 MG chewable tablet   2) Pharmacy of Choice: [id number] [place], [place] (Ph: [phone]) 3) Special Requests:   Approved medications will be sent to the pharmacy, we will reach out if there is an issue.  Requests made after 3pm may not be addressed until the following business day!  If a patient is unsure of the name of the medication(s) please note and ask patient to call back when they are able to provide all info, do not send to responsible party until all information is available!

## 2020-07-09 ENCOUNTER — Ambulatory Visit: Payer: Managed Care, Other (non HMO)

## 2020-07-09 DIAGNOSIS — I6300 Cerebral infarction due to thrombosis of unspecified precerebral artery: Secondary | ICD-10-CM

## 2020-07-09 LAB — CUP PACEART REMOTE DEVICE CHECK
Date Time Interrogation Session: 20220318101452
Implantable Pulse Generator Implant Date: 20210525

## 2020-07-10 ENCOUNTER — Ambulatory Visit: Payer: Managed Care, Other (non HMO) | Admitting: Internal Medicine

## 2020-07-17 NOTE — Progress Notes (Signed)
Carelink Summary Report / Loop Recorder 

## 2020-07-20 NOTE — Addendum Note (Signed)
Addended by: Douglass Rivers D on: 07/20/2020 02:00 PM   Modules accepted: Level of Service

## 2020-08-09 LAB — CUP PACEART REMOTE DEVICE CHECK
Date Time Interrogation Session: 20220420101748
Implantable Pulse Generator Implant Date: 20210525

## 2020-08-13 ENCOUNTER — Ambulatory Visit (INDEPENDENT_AMBULATORY_CARE_PROVIDER_SITE_OTHER): Payer: BC Managed Care – PPO

## 2020-08-13 DIAGNOSIS — I6300 Cerebral infarction due to thrombosis of unspecified precerebral artery: Secondary | ICD-10-CM

## 2020-08-31 NOTE — Progress Notes (Signed)
Carelink Summary Report / Loop Recorder 

## 2020-09-13 LAB — CUP PACEART REMOTE DEVICE CHECK
Date Time Interrogation Session: 20220523102317
Implantable Pulse Generator Implant Date: 20210525

## 2020-09-18 ENCOUNTER — Ambulatory Visit (INDEPENDENT_AMBULATORY_CARE_PROVIDER_SITE_OTHER): Payer: BC Managed Care – PPO

## 2020-09-18 DIAGNOSIS — I6300 Cerebral infarction due to thrombosis of unspecified precerebral artery: Secondary | ICD-10-CM

## 2020-09-30 ENCOUNTER — Telehealth: Payer: Self-pay | Admitting: Internal Medicine

## 2020-09-30 DIAGNOSIS — I10 Essential (primary) hypertension: Secondary | ICD-10-CM

## 2020-10-04 NOTE — Telephone Encounter (Signed)
Patient's last office visit 04/11/2020.  Patient received a courtesy refill for a 90 day supply on 06/25/2020 from Phill Myron, DO.   Please schedule in-office appointment for additional refills and to update lab work.

## 2020-10-09 NOTE — Telephone Encounter (Signed)
Called patient and scheduled her for November 06, 2020 at 8:30am

## 2020-10-11 NOTE — Progress Notes (Signed)
Carelink Summary Report / Loop Recorder 

## 2020-10-16 ENCOUNTER — Ambulatory Visit (INDEPENDENT_AMBULATORY_CARE_PROVIDER_SITE_OTHER): Payer: BC Managed Care – PPO

## 2020-10-16 DIAGNOSIS — I6300 Cerebral infarction due to thrombosis of unspecified precerebral artery: Secondary | ICD-10-CM

## 2020-10-16 LAB — CUP PACEART REMOTE DEVICE CHECK
Date Time Interrogation Session: 20220625102515
Implantable Pulse Generator Implant Date: 20210525

## 2020-11-05 NOTE — Progress Notes (Signed)
Patient ID: Belinda Banks, female    DOB: 02-18-1956  MRN: [id number]  CC: Hypertension Follow-Up   Subjective: Belinda Banks is a 65 y.o. female who presents for hypertension follow-up.    Her concerns today include:   HYPERTENSION FOLLOW-UP: 04/11/2020: - Continue Amlodipine and Labetalol as prescribed. - Follow-up with primary physician in 3 months or sooner if needed.  11/06/2020: Doing well on current regimen. No side effects. No issues/concerns. Denies chest pain and shortness of breath. She is not checking blood pressure at home. Taking over-the-counter Claritin for allergies and was concerned this may increase blood pressure.    2. DIABETES TYPE 2 FOLLOW-UP: 04/11/2020: - Hemoglobin A1C at goal today at 5.1%, goal < 7%. Previous hemoglobin A1C 5.1% on 10/10/2019. - Discontinued from Metformin around June 2021 because of well-controlled diabetes. Will continue with same plan.  11/06/2020: No concerns.  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 90 tablet 1   atorvastatin (LIPITOR) 40 MG tablet Take 1 tablet (40 mg total) by mouth daily. 90 tablet 1   losartan (COZAAR) 50 MG tablet Take 1 tablet (50 mg total) by mouth 2 (two) times daily. 180 tablet 0   No current facility-administered medications on file prior to visit.    Allergies  Allergen Reactions   Codeine Other (See Comments)    Made her loopy    Social History   Socioeconomic History    Marital status: Married    Spouse name: Not on file   Number of children: 2   Years of education: Not on file   Highest education level: Not on file  Occupational History   Not on file  Tobacco Use   Smoking status: Former    Types: Cigarettes    Quit date: 10/05/1980    Years since quitting: 40.1   Smokeless tobacco: Never  Substance and Sexual Activity   Alcohol use: Yes    Alcohol/week: 1.0 standard drink    Types: 1 Glasses of wine per week    Comment: daily   Drug use: Never   Sexual activity: Not on file  Other Topics Concern   Not on file  Social History Narrative   Not on file   Social Determinants of Health   Financial Resource Strain: Not on file  Food Insecurity: Not on file  Transportation Needs: Not on file  Physical Activity: Not on file  Stress: Not on file  Social Connections: Not on file  Intimate Partner Violence: Not on file    Family History  Problem Relation Age of Onset   Hypertension Mother    Diabetes Father    Hypertension Father    Colon polyps Father    Pancreatic cancer Sister    Heart attack Maternal Grandmother    Heart failure Maternal Grandmother    Hypertension Maternal Grandmother    Colon cancer Paternal Grandmother    Heart attack Paternal Grandfather     Past Surgical History:  Procedure Laterality  Date   ABDOMINAL HYSTERECTOMY     ANAL RECTAL MANOMETRY N/A 12/28/2019   Procedure: ANO RECTAL MANOMETRY;  Surgeon: Mauri Pole, MD;  Location: [facility];  Service: Endoscopy;  Laterality: N/A;   BACK SURGERY     BUBBLE STUDY  03/14/2019   Procedure: BUBBLE STUDY;  Surgeon: [name], MD;  Location: [facility];  Service: Cardiovascular;;   COLONOSCOPY     approx 10 years at [facility]   TEE WITHOUT CARDIOVERSION N/A 03/14/2019   Procedure: TRANSESOPHAGEAL ECHOCARDIOGRAM (TEE);  Surgeon: [name], MD;  Location: [facility];  Service: Cardiovascular;  Laterality: N/A;    ROS: Review of  Systems Negative except as stated above  PHYSICAL EXAM: BP 121/81 (BP Location: Left Arm, Patient Position: Sitting, Cuff Size: Normal)   Pulse 76   Temp 98.1 F (36.7 C)   Resp 16   Ht 5' 9.02" (1.753 m)   Wt 177 lb 3.2 oz (80.4 kg)   SpO2 96%   BMI 26.16 kg/m   Physical Exam HENT:     Head: Normocephalic and atraumatic.  Eyes:     Extraocular Movements: Extraocular movements intact.     Conjunctiva/sclera: Conjunctivae normal.     Pupils: Pupils are equal, round, and reactive to light.  Cardiovascular:     Rate and Rhythm: Normal rate and regular rhythm.     Pulses: Normal pulses.     Heart sounds: Normal heart sounds.  Pulmonary:     Effort: Pulmonary effort is normal.     Breath sounds: Normal breath sounds.  Musculoskeletal:     Cervical back: Normal range of motion and neck supple.  Neurological:     General: No focal deficit present.     Mental Status: She is alert and oriented to person, place, and time.  Psychiatric:        Mood and Affect: Mood normal.        Behavior: Behavior normal.    ASSESSMENT AND PLAN: 1. Essential hypertension: - Continue Amlodipine and Labetalol as prescribed.  - Counseled on blood pressure goal of less than 130/80, low-sodium, DASH diet, medication compliance, 150 minutes of moderate intensity exercise per week as tolerated. Discussed medication compliance, adverse effects. - BMP to evaluate kidney function and electrolyte balance. - Follow-up with primary provider in 3 months or sooner if needed.  - Basic Metabolic Panel - amLODipine (NORVASC) 10 MG tablet; Take 1 tablet (10 mg total) by mouth daily.  Dispense: 90 tablet; Refill: 0 - labetalol (NORMODYNE) 100 MG tablet; Take 1 tablet (100 mg total) by mouth 3 (three) times daily.  Dispense: 270 tablet; Refill: 0  2. Type 2 diabetes mellitus with other circulatory complication, without long-term current use of insulin ([place]): - Hemoglobin A1c today at goal at 5.3%, goal < 7%. This  is about the same as previous hemoglobin A1c of 5.1% on 04/11/2020. Next hemoglobin A1c due October 2022.  - Discussed the importance of healthy eating habits, low-carbohydrate diet, low-sugar diet, and regular aerobic exercise (at least 150 minutes a week as tolerated) to achieve or maintain control of diabetes. - Patient's diabetes is well-controlled without medication. Will continue with sam plan.  - Follow-up with primary provider in 3 months or sooner if needed.   - POCT glycosylated hemoglobin (Hb A1C)   Patient was given the opportunity to ask questions.  Patient verbalized understanding of the plan and was able to repeat key elements of the plan. Patient was given clear instructions to  go to Emergency Department or return to medical center if symptoms don't improve, worsen, or new problems develop.The patient verbalized understanding.   Orders Placed This Encounter  Procedures   Basic Metabolic Panel   POCT glycosylated hemoglobin (Hb A1C)     Requested Prescriptions   Signed Prescriptions Disp Refills   amLODipine (NORVASC) 10 MG tablet 90 tablet 0    Sig: Take 1 tablet (10 mg total) by mouth daily.   labetalol (NORMODYNE) 100 MG tablet 270 tablet 0    Sig: Take 1 tablet (100 mg total) by mouth 3 (three) times daily.    Return in about 3 months (around 02/06/2021) for Follow-Up hypertension and diabetes .  Camillia Herter, NP

## 2020-11-06 ENCOUNTER — Encounter: Payer: Self-pay | Admitting: Family

## 2020-11-06 ENCOUNTER — Other Ambulatory Visit: Payer: Self-pay

## 2020-11-06 ENCOUNTER — Ambulatory Visit: Payer: BC Managed Care – PPO | Admitting: Family

## 2020-11-06 ENCOUNTER — Telehealth: Payer: Self-pay | Admitting: Cardiology

## 2020-11-06 VITALS — BP 121/81 | HR 76 | Temp 98.1°F | Resp 16 | Ht 69.02 in | Wt 177.2 lb

## 2020-11-06 DIAGNOSIS — E1159 Type 2 diabetes mellitus with other circulatory complications: Secondary | ICD-10-CM

## 2020-11-06 DIAGNOSIS — I10 Essential (primary) hypertension: Secondary | ICD-10-CM | POA: Diagnosis not present

## 2020-11-06 LAB — POCT GLYCOSYLATED HEMOGLOBIN (HGB A1C): Hemoglobin A1C: 5.3 % (ref 4.0–5.6)

## 2020-11-06 MED ORDER — LABETALOL HCL 100 MG PO TABS: 100.0000 mg | ORAL_TABLET | Freq: Three times a day (TID) | ORAL | 0 refills | Status: DC

## 2020-11-06 MED ORDER — AMLODIPINE BESYLATE 10 MG PO TABS: 10.0000 mg | ORAL_TABLET | Freq: Every day | ORAL | 0 refills | Status: DC

## 2020-11-06 NOTE — Progress Notes (Signed)
Carelink Summary Report / Loop Recorder 

## 2020-11-06 NOTE — Patient Instructions (Signed)
Type 2 Diabetes Mellitus, Self-Care, Adult Caring for yourself after you have been diagnosed with type 2 diabetes (type 2 diabetes mellitus) means keeping your blood sugar (glucose) under control with a balance of: Nutrition. Exercise. Lifestyle changes. Medicines or insulin, if needed. Support from your team of health care providers and others. The following information explains what you need to know to manage yourdiabetes at home. What are the risks? Having diabetes can put you at risk for other long-term (chronic) conditions, such as heart disease and kidney disease. Your health careprovider may prescribe medicines to help prevent complications from diabetes. How to monitor blood glucose  Check your blood glucose every day, as often as told by your health care provider. Have your A1C (hemoglobin A1C) level checked two or more times a year, or as often as told by your health care provider. Your health care provider will set personalized treatment goals for you. Generally, the goal of treatment is to maintain the following blood glucose levels: Before meals: 80-130 mg/dL (4.4-7.2 mmol/L). After meals: below 180 mg/dL (10 mmol/L). A1C level: less than 7%. How to manage hyperglycemia and hypoglycemia Hyperglycemia symptoms Hyperglycemia, also called high blood glucose, occurs when blood glucose is too high. Make sure you know the early signs of hyperglycemia, such as: Increased thirst. Hunger. Feeling very tired. Needing to urinate more often than usual. Blurry vision. Hypoglycemia symptoms Hypoglycemia, also called low blood glucose, occurs with a blood glucose level at or below 70 mg/dL (3.9 mmol/L). Diabetes medicines lower your blood glucose and can cause hypoglycemia. The risk for hypoglycemia increases during or after exercise, during sleep, during illness, and when skipping meals or not eating for a long time (fasting). It is important to know the symptoms of hypoglycemia and treat  it right away. Always have a 15-gram rapid-acting carbohydrate snack with you to treat low blood glucose. Family members and close friends should also know the symptoms and understand how to treat hypoglycemia, in case you are not able to treat yourself. Symptoms may include: Hunger. Anxiety. Sweating and feeling clammy. Dizziness or feeling light-headed. Sleepiness. Increased heart rate. Irritability. Tingling or numbness around the mouth, lips, or tongue. Restless sleep. Severe hypoglycemia is when your blood glucose level is at or below 54 mg/dL (3 mmol/L). Severe hypoglycemia is an emergency. Do not wait to see if the symptoms will go away. Get medical help right away. Call your local emergency services (911 in the [place] Do not drive yourself to the hospital. If you have severe hypoglycemia and you cannot eat or drink, you may need glucagon. A family member or close friend should learn how to check your blood glucose and how to give you glucagon. Ask your health care provider if you needto have an emergency glucagon kit available. Follow these instructions at home: Medicines Take diabetes medicines as told by your health care provider. If your health care provider prescribed insulin or diabetes medicines, take them every day. Do not run out of insulin or other diabetes medicines. Plan ahead so you always have these available. If you use insulin, adjust your dosage based on your physical activity and what foods you eat. Your health care provider will tell you how to adjust your dosage. Take over-the-counter and prescription medicines only as told by your health care provider. Eating and drinking  What you eat and drink affects your blood glucose and your insulin dosage. Making good choices helps to control your diabetes and prevent other health problems. A healthy meal plan  includes eating lean proteins, complex carbohydrates, fresh fruits and vegetables, low-fat dairy products, and  healthyfats. Make an appointment to see a registered dietitian to help you create an eating plan that is right for you. Make sure that you: Follow instructions from your health care provider about eating or drinking restrictions. Drink enough fluid to keep your urine pale yellow. Keep a record of the carbohydrates that you eat. Do this by reading food labels and learning the standard serving sizes of foods. Follow your sick-day plan whenever you cannot eat or drink as usual. Make this plan in advance with your health care provider.  Activity Stay active. Exercise regularly, as told by your health care provider. This may include: Stretching and doing strength exercises, such as yoga or weight lifting, 2 or more times a week. Doing 150 minutes or more of moderate-intensity or vigorous-intensity exercise each week. This could be brisk walking, biking, or water aerobics. Spread out your activity over 3 or more days of the week. Do not go more than 2 days in a row without doing some kind of physical activity. When you start a new exercise or activity, work with your health care provider to adjust your insulin, medicines, or food intake as needed. Lifestyle Do not use any products that contain nicotine or tobacco, such as cigarettes, e-cigarettes, and chewing tobacco. If you need help quitting, ask your health care provider. If your health care provider says that alcohol is safe for you, limit how much you use to no more than 1 drink a day for women who are not pregnant and 2 drinks a day for men. In the [place] one drink equals one 12 oz bottle of beer (355 mL), one 5 oz glass of wine (148 mL), or one 1 oz glass of hard liquor (44 mL). Learn to manage stress. If you need help with this, ask your health care provider. Take care of your body  Keep your immunizations up to date. In addition to getting vaccinations as told by your health care provider, it is recommended that you get vaccinated against the  following illnesses: The flu (influenza). Get a flu shot every year. Pneumonia. Hepatitis B. Schedule an eye exam soon after your diagnosis, and then one time every year after that. Check your skin and feet every day for cuts, bruises, redness, blisters, or sores. Schedule a foot exam with your health care provider once every year. Brush your teeth and gums two times a day, and floss one or more times a day. Visit your dentist one or more times every 6 months. Maintain a healthy weight.  General instructions Share your diabetes management plan with people in your workplace, school, and household. Carry a medical alert card or wear medical alert jewelry. Keep all follow-up visits as told by your health care provider. This is important. Questions to ask your health care provider Should I meet with a certified diabetes care and education specialist? Where can I find a support group for people with diabetes? Where to find more information American Diabetes Association (ADA): www.diabetes.org American Association of Diabetes Care and Education Specialists (ADCES): www.diabeteseducator.org International Diabetes Federation (IDF): MemberVerification.ca Summary Caring for yourself after you have been diagnosed with type 2 diabetes (type 2 diabetes mellitus) means keeping your blood sugar (glucose) under control with a balance of nutrition, exercise, lifestyle changes, and medicine. Check your blood glucose every day, as often as told by your health care provider. Having diabetes can put you at risk for other  long-term (chronic) conditions, such as heart disease and kidney disease. Your health care provider may prescribe medicines to help prevent complications from diabetes. Share your diabetes management plan with people in your workplace, school, and household. Keep all follow-up visits as told by your health care provider. This is important. This information is not intended to replace advice given to you  by your health care provider. Make sure you discuss any questions you have with your healthcare provider. Document Revised: 05/16/2019 Document Reviewed: 05/17/2019 Elsevier Patient Education  2021 [employer].

## 2020-11-06 NOTE — Progress Notes (Signed)
Pt presents for hypertension follow-up, pt has no other concerns

## 2020-11-06 NOTE — Telephone Encounter (Signed)
Patient wanted to talk to someone about getting her loop recorder removed. Please call

## 2020-11-07 LAB — BASIC METABOLIC PANEL
BUN/Creatinine Ratio: 20 (ref 12–28)
BUN: 15 mg/dL (ref 8–27)
CO2: 22 mmol/L (ref 20–29)
Calcium: 9.6 mg/dL (ref 8.7–10.3)
Chloride: 103 mmol/L (ref 96–106)
Creatinine, Ser: 0.75 mg/dL (ref 0.57–1.00)
Glucose: 106 mg/dL — ABNORMAL HIGH (ref 65–99)
Potassium: 4 mmol/L (ref 3.5–5.2)
Sodium: 142 mmol/L (ref 134–144)
eGFR: 89 mL/min/{1.73_m2} (ref >59)

## 2020-11-07 NOTE — Progress Notes (Signed)
Kidney function normal.   Diabetes discussed in office.

## 2020-11-07 NOTE — Telephone Encounter (Signed)
Pt calling in to ask about ILR. States that insurance has changed and now is costing $88/month (used to cost $30) Pt would like to know being that she has had this in for a year and nothing has been found, should she continue it vs taking it out. Pt aware I will discuss w/ MD and let he know his recommendation/s. She is agreeable to plan.

## 2020-11-09 NOTE — Addendum Note (Signed)
Addended by: Douglass Rivers D on: 11/09/2020 09:03 AM   Modules accepted: Level of Service

## 2020-11-12 DIAGNOSIS — Z0271 Encounter for disability determination: Secondary | ICD-10-CM

## 2020-11-15 ENCOUNTER — Ambulatory Visit (INDEPENDENT_AMBULATORY_CARE_PROVIDER_SITE_OTHER): Payer: BC Managed Care – PPO

## 2020-11-15 DIAGNOSIS — I639 Cerebral infarction, unspecified: Secondary | ICD-10-CM | POA: Diagnosis not present

## 2020-11-17 LAB — CUP PACEART REMOTE DEVICE CHECK
Date Time Interrogation Session: 20220728102822
Implantable Pulse Generator Implant Date: 20210525

## 2020-12-06 ENCOUNTER — Ambulatory Visit: Payer: BC Managed Care – PPO | Admitting: Adult Health

## 2020-12-06 ENCOUNTER — Encounter: Payer: Self-pay | Admitting: Adult Health

## 2020-12-06 VITALS — BP 112/77 | HR 76 | Ht 68.0 in | Wt 179.4 lb

## 2020-12-06 DIAGNOSIS — I10 Essential (primary) hypertension: Secondary | ICD-10-CM | POA: Diagnosis not present

## 2020-12-06 DIAGNOSIS — I639 Cerebral infarction, unspecified: Secondary | ICD-10-CM

## 2020-12-06 DIAGNOSIS — E119 Type 2 diabetes mellitus without complications: Secondary | ICD-10-CM

## 2020-12-06 DIAGNOSIS — I610 Nontraumatic intracerebral hemorrhage in hemisphere, subcortical: Secondary | ICD-10-CM | POA: Diagnosis not present

## 2020-12-06 DIAGNOSIS — Z5181 Encounter for therapeutic drug level monitoring: Secondary | ICD-10-CM

## 2020-12-06 DIAGNOSIS — E785 Hyperlipidemia, unspecified: Secondary | ICD-10-CM

## 2020-12-06 NOTE — Patient Instructions (Addendum)
Continue aspirin 81 mg daily  and atorvastatin 40 mg daily for secondary stroke prevention  Loop recorder will continue to be monitored by cardiology which has not shown atrial fibrillation thus far - if you wish to discuss having this removed, please reach back out to cardiology  Continue to follow up with PCP regarding cholesterol, blood pressure and diabetes management  Maintain strict control of hypertension with blood pressure goal below 130/90, diabetes with hemoglobin A1c goal below 7% and cholesterol with LDL cholesterol (bad cholesterol) goal below 70 mg/dL.   We will check cholesterol levels today and liver function     Follow up in 1 year or call earlier if needed     Thank you for coming to see [place] at [facility]. I hope we have been able to provide you high quality care today.  You may receive a patient satisfaction survey over the next few weeks. We would appreciate your feedback and comments so that we may continue to improve ourselves and the health of our patients.

## 2020-12-06 NOTE — Progress Notes (Signed)
[facility] [address] [place]. [place] [address] [phone]       STROKE FOLLOW UP NOTE  [name] Date of Birth:  [birth year] Medical Record Number:  [id number]   Reason for Referral:  stroke follow up    CHIEF COMPLAINT:  Chief Complaint  Patient presents with   Follow-up    Rm 2 alone- Here for 6 month f/u reports she has been doing well.      HPI:  Today, 12/06/2020, Belinda Banks returns for 86-monthstroke follow-up unaccompanied.  Overall doing well.  Denies new stroke/TIA symptoms.  She has stopped pursuing Social Security disability due to difficulties getting this approved - she will be 66 next year and will be eligible for Medicare which was her main reason for pursuing.  Compliant on aspirin 81 mg daily and atorvastatin 40 mg daily-denies associated side effects.  Blood pressure today 112/77.  Routinely monitors at home and typically stable.  Diabetes stable recent A1c 5.3 (10/2020) on nonpharmacological management.  Loop recorder has not shown atrial fibrillation.  She does not question indication/need for continued monitoring - reached out to cardiology last month but was not contacted back.  No new concerns at this time.    History provided for reference purposes only Update 06/07/2020 JM: Belinda Banks returns for 672-monthtroke follow-up accompanied by her husband.   Reports residual of visual impairment, alexia, left hand tremor and cognitive impairment relatively stable. She continues to apply for SSD due to residual deficits -difficulty with reading, performing multistep tasks, processing deficit and multitasking Denies new stroke/TIA symptoms  Reports compliance with aspirin 81 mg daily and atorvastatin 40 mg daily Blood pressure 118/76 -monitors at home and typically stable She does not routinely check glucose levels as this has been stable - recent A1c 5.1 (03/2020) Loop recorder has not shown atrial fibrillation thus far  No  concerns at this time  Update 12/06/2019 JM: Ms. McFerkoeturns for stroke follow-up accompanied by her husband. Residual deficits of short-term memory impairment and visual impairment; she questions possible worsening vs being more aware of deficits  Prism glasses provided some benefit in regards to reading but continues to have difficulty with comprehension and understanding what she is reading.  She is not able to read at all without use of prism glasses. 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  Completed STD and only received LTD for 2 days - she has since retired and currently planning on applying for social security disability.  Denies new stroke/TIA symptoms Remains on aspirin 81 mg daily and atorvastatin 40 mg daily for secondary stroke prevention without side effects Blood pressure today 120/84 Glucose levels have been stable -reports PCP discontinued metformin due to improvement of A1c HTN, HLD and DM managed by PCP Loop recorder placed on 09/22/2019 which has not shown atrial fibrillation thus far  Other concerns She does report cluster periods of difficulty staying asleep does not have issue falling asleep She has trialed different types of melatonin but would only take intermittently as she did not find benefit Denies snoring, morning headaches, or witnessed apnea Occasional day time fatigue with occasional afternoon naps but denies excessive fatigue No further concerns at this time  Update 06/24/2019 JM: Belinda Banks a [age]ear old female who is being seen today, 07/14/2019, for stroke follow-up.  She continues to have residual visual difficulties and was evaluated by ophthalmology on  06/03/2019 which showed bilateral inferior altitudinal visual field defects as well as bilateral homonymous inferior quadrantanopia.  She was referred to low vision specialist at [facility] low vision center for further  evaluation.  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  Hallucinations likely release hallucinations as they appeared on her blind areas - have improved since prior visit.  She does plan on follow-up with her life specialist in 3 months.  She does report mild improvement of vision but continues to have difficulty with reading and remembering numbers.  She also complains of short-term memory deficit.  She has completed outpatient therapy but continues to do exercises at home.  She continues on short-term disability due to ongoing visual deficits. She did have follow-up with cardiology who recommended placement of loop recorder due to cryptogenic stroke etiology based on further recommendations at today's visit.  Continues on aspirin 81 mg daily without bleeding or bruising.  Repeat LFTs normalized and recommended initiating atorvastatin 20 mg daily.  Recent repeat LFT by PCP remains WNL.  Blood pressure today 111/74.  No further concerns at this time.   Initial visit 04/20/2019 JM: Belinda Banks is a 65 year old female who is being seen today for hospital follow-up accompanied by her husband.  Residual deficits of visual impairment, cognitive impairment, aphasia and decreased right hand dexterity.  She has difficulty fully explaining visual impairment as she denies diplopia or blurred vision but describes it as distortion of images and words which is worsened if she is concentrating for prolonged period of time.  Vision will improve after closing and resting eyes.  She also endorses occasional visual hallucinations.  She has not had evaluation by ophthalmology at this time and is requesting evaluation by neuro-ophthalmology as recommended by therapy.  She continues to work with outpatient speech and OT with ongoing improvement.  She has not been able to return to work due to [employer] of job requires  computer and paperwork.  She is currently receiving short-term disability from hospitalization and is requesting continuation of short-term disability through our office.  She has not established care with PCP at this time but does have initial appointment in approximately 1 month.  She also endorses occasional progressive symptoms of left hand numbness slowly progressing up arm and then will begin tremoring lasting 30 seconds to 1 minute.  Usually occurs after activity especially after shower.  She has continued on aspirin 81 mg daily without bleeding or bruising.  She has not had repeat LFTs as she does not have established PCP at this time therefore currently not on statin therapy.  Blood pressure today satisfactory at 117/79.  Review of vasculitis work-up all unremarkable.  She reports decreasing EtOH use and has only had approximately 3 drinks since discharge.  No further concerns at this time.  Stroke admission 03/05/2019: Ms. CIEANNA YOKOTA is a 65 y.o. female with history of HTN (no recent medical f/u) who presented to [facility] ED as a code stroke with c/o slurred speech, right sided weakness, facial droop, BP 230/158 and glucose 238.  CT head showed small ICH centered at the left lentiform nucleus estimated volume 3.8 cc felt secondary to hypertensive.  CTA head/neck unchanged size of hypertensive hemorrhage in left basal ganglia without evidence of aneurysm or AVM.  2D echo normal EF without cardiac source of embolus identified.  COVID-19 negative.  Hypertensive emergency on arrival treated with Cleviprex with increasing Cozaar, initiating labetalol  and continuation of Norvasc.   Alcohol abuse 4-6 drinks per day placed on B1/FA/MVI and advised to drink no more than one alcoholic beverage per day.  New diagnosis of uncontrolled DM with A1c 7.6 and initiated Metformin 500 mg twice daily and advised to follow-up with PCP outpatient.  LDL 106 and initiated atorvastatin 20 mg daily.  Other stroke risk factors  include former tobacco use and sedentary lifestyle.  Residual deficits of right facial droop, right pronator drift with decreased right hand dexterity and discharged home in stable condition with outpatient therapies.   Stroke admission 03/11/2019: She returned on 03/11/2019 with complaints of generalized weakness, nausea and slight confusion.  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  MRI w/wo contrast showed multiple acute to early subacute cerebral infarcts bilaterally largest in posterior left MCA region, stable appearance of subacute hemorrhage, and moderate chronic small vessel ischemic disease with multiple chronic lacunar infarcts.  CTA head and neck and 2D echo not repeated as performed on prior admission.  Underwent TEE and was negative for vegetations, embolus or PFO.  Discontinued atorvastatin due to elevated LFTs.  Evaluated by Dr. Leonie Man who felt initial infarct was hemorrhagic rather than primary intracerebral hemorrhage.  Recommended initiating aspirin 81 mg daily for stroke prevention.  Felt to be not a candidate for long-term anticoagulation at that time but may consider loop recorder placement in the future for further embolic work-up.  Vasculitis work-up pending at discharge.  Residual deficits of right peripheral partial field loss, mild right lower facial asymmetry, diminished fine finger movements on the right, mild weakness of right grip and mild right hip flexor and ankle dorsiflexion weakness.  Discharged home with recommendation of outpatient therapy.       ROS:   14 system review of systems performed and negative with exception of those listed in HPI  PMH:  Past Medical History:  Diagnosis Date   Cataract    Chronic kidney disease    Diabetes mellitus without complication ([place])    type 2   Hyperlipidemia    Hypertension     controlled   Stroke ([place])    x 2 02/2019    PSH:  Past Surgical History:  Procedure Laterality Date   ABDOMINAL HYSTERECTOMY     ANAL RECTAL MANOMETRY N/A 12/28/2019   Procedure: ANO RECTAL MANOMETRY;  Surgeon: Mauri Pole, MD;  Location: [facility];  Service: Endoscopy;  Laterality: N/A;   BACK SURGERY     BUBBLE STUDY  03/14/2019   Procedure: BUBBLE STUDY;  Surgeon: [name], MD;  Location: [place];  Service: Cardiovascular;;   COLONOSCOPY     approx 10 years at [facility] GI   TEE WITHOUT CARDIOVERSION N/A 03/14/2019   Procedure: TRANSESOPHAGEAL ECHOCARDIOGRAM (TEE);  Surgeon: [name] [name], MD;  Location: [facility] ENDOSCOPY;  Service: Cardiovascular;  Laterality: N/A;    Social History:  Social History   Socioeconomic History   Marital status: Married    Spouse name: Not on file   Number of children: 2   Years of education: Not on file   Highest education level: Not on file  Occupational History   Not on file  Tobacco Use   Smoking status: Former    Types: Cigarettes    Quit date: 10/05/1980    Years since quitting: 40.1   Smokeless tobacco: Never  Substance and Sexual Activity   Alcohol use: Yes  Alcohol/week: 1.0 standard drink    Types: 1 Glasses of wine per week    Comment: daily   Drug use: Never   Sexual activity: Not on file  Other Topics Concern   Not on file  Social History Narrative   Not on file   Social Determinants of Health   Financial Resource Strain: Not on file  Food Insecurity: Not on file  Transportation Needs: Not on file  Physical Activity: Not on file  Stress: Not on file  Social Connections: Not on file  Intimate Partner Violence: Not on file    Family History:  Family History  Problem Relation Age of Onset   Hypertension Mother    Diabetes Father    Hypertension Father    Colon polyps Father    Pancreatic cancer Sister    Heart attack Maternal Grandmother    Heart failure Maternal Grandmother     Hypertension Maternal Grandmother    Colon cancer Paternal Grandmother    Heart attack Paternal Grandfather     Medications:   Current Outpatient Medications on File Prior to Visit  Medication Sig Dispense Refill   amLODipine (NORVASC) 10 MG tablet Take 1 tablet (10 mg total) by mouth daily. 90 tablet 0   aspirin ([facility] ASPIRIN ADULT LOW DOSE) 81 MG chewable tablet CHEW 1 TABLET BY MOUTH DAILY. 90 tablet 1   atorvastatin (LIPITOR) 40 MG tablet Take 1 tablet (40 mg total) by mouth daily. 90 tablet 1   labetalol (NORMODYNE) 100 MG tablet Take 1 tablet (100 mg total) by mouth 3 (three) times daily. 270 tablet 0   losartan (COZAAR) 50 MG tablet Take 1 tablet (50 mg total) by mouth 2 (two) times daily. 180 tablet 0   No current facility-administered medications on file prior to visit.    Allergies:   Allergies  Allergen Reactions   Codeine Other (See Comments)    Made her loopy     Physical Exam  Vitals:   12/06/20 1041  BP: 112/77  Pulse: 76  Weight: 179 lb 6 oz (81.4 kg)  Height: '5\' 8"'$  (1.727 m)    Body mass index is 27.27 kg/m. No results found.   General: well developed, well nourished,  pleasant middle-age Caucasian female, seated, in no evident distress Neck: supple with no carotid or supraclavicular bruits Cardiovascular: regular rate and rhythm, no murmurs Vascular:  Normal pulses all extremities   Neurologic Exam Mental Status: Awake and fully alert.  Fluent speech and language.  Oriented to place and time. Recent memory subjectively diminished and remote memory intact. Attention span, concentration and fund of knowledge appropriate during visit. Mood and affect appropriate.  Cranial Nerves: Pupils equal, briskly reactive to light. Extraocular movements full. Visual fields full to confrontation although decreased vision b/l left lower periphery - can see fingers move but unable to tell how may holding up.  Hearing intact. Facial sensation intact. Face, tongue, palate  moves normally and symmetrically.  Motor: Normal bulk and tone.  Full strength in all tested extremities Sensory.: intact to touch , pinprick , position and vibratory sensation.  Coordination: Rapid alternating movements normal in all extremities.  Finger-to-nose and heel-to-shin performed accurately bilaterally.  Slight left hand tremor with outstretched arms- stable.  No tremor at rest, bradykinesia or cogwheel rigidity Gait and Station: Arises from chair without difficulty. Stance is normal. Gait demonstrates normal stride length and balance without use of assistive device.  Tandem walk and heel toe without difficulty. Reflexes: 1+ and symmetric. Toes  downgoing.         ASSESSMENT: Belinda Banks is a 65 y.o. year old female with recent admission on 03/05/2019 for left basal ganglia hemorrhage felt at that time to be hypertensive in etiology and then returned on 03/11/2019 with generalized weakness, nausea and slight confusion and found to have multiple additional acute and subacute bilateral cerebral infarcts embolic pattern secondary to unknown source.  Septic emboli and vasculitis ruled out.  Loop recorder placed on 09/22/2019 due to cryptogenic etiology.  Vascular risk factors include HTN, HLD, DM, history of EtOH use, and DM.      PLAN:  Multiple strokes as above:  Residual deficits: Visual field deficit, visual spatial deficit, alexia, mild left hand tremor and short-term memory impairment.  Stable.  Continue aspirin 81 mg daily and atorvastatin 40 mg daily for secondary stroke prevention.  Loop recorder placed 09/22/2019: Has not shown atrial fibrillation thus far; continue to monitor by cardiology. Recommend continued monitoring at this time Discussed secondary stroke prevention measures and portance of close PCP follow-up for aggressive stroke risk factor management HTN: BP goal <130/90.  Stable.  Managed by PCP HLD: LDL goal <70.  Continue atorvastatin 40 mg daily.  Repeat lipid  panel today as well as LFTs.  Request routine lab work in the future by PCP as well as prescribing of statin DMII: A1c goal <7.0. Recent A1c 5.3 well controlled nonpharmacological management    Follow up in 1 year or call earlier if needed. If remains stable, may follow up as needed   CC:  [facility] provider: Dr. Desma Maxim, Amy J, NP    I spent 26 minutes of face-to-face and non-face-to-face time with patient.  This included previsit chart review, lab review, study review, order entry, electronic health record documentation, patient education regarding prior strokes, residual deficits, review and monitoring of loop recorder and indication for continued monitoring, secondary stroke prevention measures and importance of managing stroke risk factors, and answered all other questions to patients satisfaction  Frann Rider, AGNP-BC  [facility] [address] [place], [place] [address]  Phone [phone] Fax [phone] Note: This document was prepared with digital dictation and possible smart phrase technology. Any transcriptional errors that result from this process are unintentional.

## 2020-12-07 LAB — HEPATIC FUNCTION PANEL
ALT: 61 IU/L — ABNORMAL HIGH (ref 0–32)
AST: 62 IU/L — ABNORMAL HIGH (ref 0–40)
Albumin: 5.1 g/dL — ABNORMAL HIGH (ref 3.8–4.8)
Alkaline Phosphatase: 104 IU/L (ref 44–121)
Bilirubin Total: 1.1 mg/dL (ref 0.0–1.2)
Bilirubin, Direct: 0.34 mg/dL (ref 0.00–0.40)
Total Protein: 7.7 g/dL (ref 6.0–8.5)

## 2020-12-07 LAB — LIPID PANEL
Chol/HDL Ratio: 2.6 ratio (ref 0.0–4.4)
Cholesterol, Total: 189 mg/dL (ref 100–199)
HDL: 74 mg/dL (ref >39)
LDL Chol Calc (NIH): 92 mg/dL (ref 0–99)
Triglycerides: 133 mg/dL (ref 0–149)
VLDL Cholesterol Cal: 23 mg/dL (ref 5–40)

## 2020-12-11 ENCOUNTER — Telehealth: Payer: Self-pay | Admitting: Adult Health

## 2020-12-11 ENCOUNTER — Other Ambulatory Visit: Payer: Self-pay | Admitting: Adult Health

## 2020-12-11 DIAGNOSIS — Z5181 Encounter for therapeutic drug level monitoring: Secondary | ICD-10-CM

## 2020-12-11 DIAGNOSIS — E785 Hyperlipidemia, unspecified: Secondary | ICD-10-CM

## 2020-12-11 MED ORDER — ROSUVASTATIN CALCIUM 40 MG PO TABS: 40.0000 mg | ORAL_TABLET | Freq: Every day | ORAL | 5 refills | Status: DC

## 2020-12-11 NOTE — Telephone Encounter (Signed)
Contacted pt back, informed her that she should stop atorvastatin and start crestor, per [place]. She understood and was appreciative for the clarity.

## 2020-12-11 NOTE — Telephone Encounter (Signed)
Pt called stating that she was called to discuss a medication. Please call pt back when available.

## 2020-12-11 NOTE — Progress Notes (Signed)
I agree with the above plan 

## 2020-12-12 NOTE — Progress Notes (Signed)
Carelink Summary Report / Loop Recorder 

## 2020-12-18 ENCOUNTER — Ambulatory Visit (INDEPENDENT_AMBULATORY_CARE_PROVIDER_SITE_OTHER): Payer: BC Managed Care – PPO

## 2020-12-18 DIAGNOSIS — I639 Cerebral infarction, unspecified: Secondary | ICD-10-CM

## 2020-12-18 LAB — CUP PACEART REMOTE DEVICE CHECK
Date Time Interrogation Session: 20220830103317
Implantable Pulse Generator Implant Date: 20210525

## 2020-12-26 ENCOUNTER — Other Ambulatory Visit: Payer: Self-pay

## 2020-12-26 ENCOUNTER — Telehealth: Payer: Self-pay | Admitting: Family

## 2020-12-26 DIAGNOSIS — I10 Essential (primary) hypertension: Secondary | ICD-10-CM

## 2020-12-26 MED ORDER — LOSARTAN POTASSIUM 50 MG PO TABS: 50.0000 mg | ORAL_TABLET | Freq: Two times a day (BID) | ORAL | 0 refills | Status: DC

## 2020-12-26 NOTE — Telephone Encounter (Signed)
Pt last seen 11-06-20 with amy and does have an appt with wilson coming up but pt states she is out of losartan (COZAAR) 50 MG tablet  and needs a refill please assist

## 2020-12-26 NOTE — Telephone Encounter (Signed)
Patient called back stating she would like her medication [facility] to [facility] at [phone]. Patient states she has been out for 2 days. Medication refill request in box upfront

## 2020-12-26 NOTE — Telephone Encounter (Signed)
Per patient request Losartan refilled for courtesy 45  day supply. Please keep scheduled appointment for additional refills

## 2020-12-26 NOTE — Progress Notes (Signed)
Per patient request Losartan refilled for courtesy 45 day supply. Please keep scheduled appointment for additional refills

## 2021-01-01 NOTE — Progress Notes (Signed)
Carelink Summary Report / Loop Recorder 

## 2021-01-17 ENCOUNTER — Other Ambulatory Visit: Payer: Self-pay | Admitting: Family

## 2021-01-17 DIAGNOSIS — I10 Essential (primary) hypertension: Secondary | ICD-10-CM

## 2021-01-21 ENCOUNTER — Ambulatory Visit (INDEPENDENT_AMBULATORY_CARE_PROVIDER_SITE_OTHER): Payer: BC Managed Care – PPO

## 2021-01-21 DIAGNOSIS — I639 Cerebral infarction, unspecified: Secondary | ICD-10-CM

## 2021-01-21 LAB — CUP PACEART REMOTE DEVICE CHECK
Date Time Interrogation Session: 20221002103908
Implantable Pulse Generator Implant Date: 20210525

## 2021-01-29 NOTE — Progress Notes (Signed)
Carelink Summary Report / Loop Recorder 

## 2021-02-06 ENCOUNTER — Ambulatory Visit: Payer: BC Managed Care – PPO | Admitting: Family

## 2021-02-06 ENCOUNTER — Ambulatory Visit: Payer: BC Managed Care – PPO | Admitting: Family Medicine

## 2021-02-10 ENCOUNTER — Other Ambulatory Visit: Payer: Self-pay | Admitting: Internal Medicine

## 2021-02-11 ENCOUNTER — Ambulatory Visit: Payer: BC Managed Care – PPO | Admitting: Family Medicine

## 2021-02-11 ENCOUNTER — Other Ambulatory Visit: Payer: Self-pay

## 2021-02-11 VITALS — BP 131/86 | HR 75 | Temp 98.5°F | Resp 16 | Wt 173.4 lb

## 2021-02-11 DIAGNOSIS — E1159 Type 2 diabetes mellitus with other circulatory complications: Secondary | ICD-10-CM | POA: Diagnosis not present

## 2021-02-11 DIAGNOSIS — I10 Essential (primary) hypertension: Secondary | ICD-10-CM

## 2021-02-11 DIAGNOSIS — E782 Mixed hyperlipidemia: Secondary | ICD-10-CM | POA: Diagnosis not present

## 2021-02-11 NOTE — Progress Notes (Signed)
Patient is here for follow-up 3 months

## 2021-02-12 ENCOUNTER — Other Ambulatory Visit: Payer: Self-pay | Admitting: Internal Medicine

## 2021-02-12 ENCOUNTER — Encounter: Payer: Self-pay | Admitting: Family Medicine

## 2021-02-12 DIAGNOSIS — Z1231 Encounter for screening mammogram for malignant neoplasm of breast: Secondary | ICD-10-CM

## 2021-02-12 NOTE — Progress Notes (Signed)
Established Patient Office Visit  Subjective:  Patient ID: Belinda Banks, female    DOB: 17-Sep-1955  Age: 65 y.o. MRN: [id number]  CC:  Chief Complaint  Patient presents with   Follow-up    HPI [name] presents for routine follow up of chronic med issues including hypertension and diabetes. Patient denies acute complaints or concerns on today.   Past Medical History:  Diagnosis Date   Cataract    Chronic kidney disease    Diabetes mellitus without complication ([place])    type 2   Hyperlipidemia    Hypertension    controlled   Stroke ([place])    x 2 02/2019    Past Surgical History:  Procedure Laterality Date   ABDOMINAL HYSTERECTOMY     ANAL RECTAL MANOMETRY N/A 12/28/2019   Procedure: ANO RECTAL MANOMETRY;  Surgeon: Mauri Pole, MD;  Location: [facility];  Service: Endoscopy;  Laterality: N/A;   BACK SURGERY     BUBBLE STUDY  03/14/2019   Procedure: BUBBLE STUDY;  Surgeon: [name], MD;  Location: [place];  Service: Cardiovascular;;   COLONOSCOPY     approx 10 years at [facility] GI   TEE WITHOUT CARDIOVERSION N/A 03/14/2019   Procedure: TRANSESOPHAGEAL ECHOCARDIOGRAM (TEE);  Surgeon: [name] [name], MD;  Location: [facility] ENDOSCOPY;  Service: Cardiovascular;  Laterality: N/A;     Social History   Socioeconomic History   Marital status: Married    Spouse name: Not on file   Number of children: 2   Years of education: Not on file   Highest education level: Not on file  Occupational History   Not on file  Tobacco Use   Smoking status: Former    Types: Cigarettes    Quit date: 10/05/1980    Years since quitting: 40.3   Smokeless tobacco: Never  Substance and Sexual Activity   Alcohol use: Yes    Alcohol/week: 1.0 standard drink    Types: 1 Glasses of wine per week    Comment: daily   Drug use: Never   Sexual activity: Not on file  Other Topics Concern   Not on file  Social History Narrative   Not on file   Social Determinants of  Health   Financial Resource Strain: Not on file  Food Insecurity: Not on file  Transportation Needs: Not on file  Physical Activity: Not on file  Stress: Not on file  Social Connections: Not on file  Intimate Partner Violence: Not on file    ROS Review of Systems  Cardiovascular: Negative.   Endocrine: Negative.   Neurological: Negative.   All other systems reviewed and are negative.  Objective:   Today's Vitals: BP 131/86   Pulse 75   Temp 98.5 F (36.9 C) (Oral)   Resp 16   Wt 173 lb 6.4 oz (78.7 kg)   SpO2 98%   BMI 26.37 kg/m   Physical Exam Vitals and nursing note reviewed.  Constitutional:      General: She is not in acute distress. Cardiovascular:     Rate and Rhythm: Normal rate and regular rhythm.  Pulmonary:     Effort: Pulmonary effort is normal.     Breath sounds: Normal breath sounds.  Abdominal:     Palpations: Abdomen is soft.     Tenderness: There is no abdominal tenderness.  Musculoskeletal:     Right lower leg: No edema.     Left lower leg: No edema.  Neurological:  General: No focal deficit present.     Mental Status: She is alert and oriented to person, place, and time.    Assessment & Plan:   1. Essential hypertension Continue present management and monitor  2. Type 2 diabetes mellitus with other circulatory complication, without long-term current use of insulin (HCC) Recent A1c is wnl. Continue dietary management and monitor  3. Mixed hyperlipidemia Continue present management  Outpatient Encounter Medications as of 02/11/2021  Medication Sig   amLODipine (NORVASC) 10 MG tablet Take 1 tablet (10 mg total) by mouth daily.   aspirin ([facility] ASPIRIN ADULT LOW DOSE) 81 MG chewable tablet CHEW 1 TABLET BY MOUTH DAILY.   labetalol (NORMODYNE) 100 MG tablet Take 1 tablet (100 mg total) by mouth 3 (three) times daily.   losartan (COZAAR) 50 MG tablet TAKE 1 TABLET BY MOUTH TWICE A DAY   rosuvastatin (CRESTOR) 40 MG tablet Take 1 tablet  (40 mg total) by mouth daily.   No facility-administered encounter medications on file as of 02/11/2021.    Follow-up: Return in about 6 months (around 08/12/2021).   Becky Sax, MD

## 2021-02-13 ENCOUNTER — Other Ambulatory Visit: Payer: Self-pay

## 2021-02-13 ENCOUNTER — Ambulatory Visit
Admission: RE | Admit: 2021-02-13 | Discharge: 2021-02-13 | Disposition: A | Discharge status: Home / Self Care | Payer: BC Managed Care – PPO | Source: Ambulatory Visit | Attending: Internal Medicine | Admitting: Internal Medicine

## 2021-02-13 DIAGNOSIS — Z1231 Encounter for screening mammogram for malignant neoplasm of breast: Secondary | ICD-10-CM

## 2021-02-20 ENCOUNTER — Other Ambulatory Visit: Payer: Self-pay | Admitting: *Deleted

## 2021-02-20 DIAGNOSIS — I10 Essential (primary) hypertension: Secondary | ICD-10-CM

## 2021-02-20 MED ORDER — LOSARTAN POTASSIUM 50 MG PO TABS
50.0000 mg | ORAL_TABLET | Freq: Two times a day (BID) | ORAL | 1 refills | Status: DC
Start: 2021-02-20 — End: 2021-03-18

## 2021-02-20 MED ORDER — AMLODIPINE BESYLATE 10 MG PO TABS: 10.0000 mg | ORAL_TABLET | Freq: Every day | ORAL | 0 refills | Status: DC

## 2021-02-24 LAB — CUP PACEART REMOTE DEVICE CHECK
Date Time Interrogation Session: 20221104104439
Implantable Pulse Generator Implant Date: 20210525

## 2021-02-25 ENCOUNTER — Ambulatory Visit (INDEPENDENT_AMBULATORY_CARE_PROVIDER_SITE_OTHER): Payer: BC Managed Care – PPO

## 2021-02-25 DIAGNOSIS — I6389 Other cerebral infarction: Secondary | ICD-10-CM | POA: Diagnosis not present

## 2021-02-25 DIAGNOSIS — I639 Cerebral infarction, unspecified: Secondary | ICD-10-CM

## 2021-03-05 NOTE — Progress Notes (Signed)
Carelink Summary Report / Loop Recorder 

## 2021-03-10 ENCOUNTER — Other Ambulatory Visit: Payer: Self-pay | Admitting: Family

## 2021-03-10 DIAGNOSIS — I10 Essential (primary) hypertension: Secondary | ICD-10-CM

## 2021-03-13 MED ORDER — LABETALOL HCL 100 MG PO TABS: 100.0000 mg | ORAL_TABLET | Freq: Three times a day (TID) | ORAL | 0 refills | Status: DC

## 2021-03-17 ENCOUNTER — Other Ambulatory Visit: Payer: Self-pay | Admitting: Family

## 2021-03-17 DIAGNOSIS — I10 Essential (primary) hypertension: Secondary | ICD-10-CM

## 2021-03-27 LAB — CUP PACEART REMOTE DEVICE CHECK
Date Time Interrogation Session: 20221207094944
Implantable Pulse Generator Implant Date: 20210525

## 2021-04-01 ENCOUNTER — Ambulatory Visit (INDEPENDENT_AMBULATORY_CARE_PROVIDER_SITE_OTHER): Payer: BC Managed Care – PPO

## 2021-04-01 DIAGNOSIS — I639 Cerebral infarction, unspecified: Secondary | ICD-10-CM

## 2021-04-06 ENCOUNTER — Other Ambulatory Visit: Payer: Self-pay | Admitting: Internal Medicine

## 2021-04-09 DIAGNOSIS — Z0271 Encounter for disability determination: Secondary | ICD-10-CM

## 2021-04-11 NOTE — Progress Notes (Signed)
Carelink Summary Report / Loop Recorder 

## 2021-04-30 ENCOUNTER — Ambulatory Visit (INDEPENDENT_AMBULATORY_CARE_PROVIDER_SITE_OTHER): Payer: Self-pay

## 2021-04-30 DIAGNOSIS — I639 Cerebral infarction, unspecified: Secondary | ICD-10-CM

## 2021-04-30 LAB — CUP PACEART REMOTE DEVICE CHECK
Date Time Interrogation Session: 20230109095258
Implantable Pulse Generator Implant Date: 20210525

## 2021-05-03 ENCOUNTER — Other Ambulatory Visit: Payer: Self-pay | Admitting: Adult Health

## 2021-05-03 DIAGNOSIS — E785 Hyperlipidemia, unspecified: Secondary | ICD-10-CM

## 2021-05-08 ENCOUNTER — Ambulatory Visit
Admission: EM | Admit: 2021-05-08 | Discharge: 2021-05-08 | Disposition: A | Discharge status: Home / Self Care | Payer: BC Managed Care – PPO | Attending: Internal Medicine | Admitting: Internal Medicine

## 2021-05-08 DIAGNOSIS — M79672 Pain in left foot: Secondary | ICD-10-CM | POA: Diagnosis not present

## 2021-05-08 DIAGNOSIS — M25572 Pain in left ankle and joints of left foot: Secondary | ICD-10-CM | POA: Diagnosis not present

## 2021-05-08 DIAGNOSIS — W19XXXA Unspecified fall, initial encounter: Secondary | ICD-10-CM

## 2021-05-08 NOTE — ED Provider Notes (Signed)
[facility]    CSN: [id number] Arrival date & time: 05/08/21  1515      History   Chief Complaint Chief Complaint  Patient presents with   left foot injury    HPI Belinda Banks is a 66 y.o. female.   Patient presents for left ankle and left foot pain that started today after a fall.  Patient reports that she was walking down the steps when she slipped and fell.  Denies hitting head or losing conscious during her fall.  She is not sure the mechanism of injury of her left foot and ankle but reports that she is having pain there.  Denies any pain in any other part of the body.  Patient does not take blood thinners.  Denies numbness or tingling to left lower extremity.  Patient is having difficulty bearing weight.    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    Past Surgical History:  Procedure Laterality Date   ABDOMINAL HYSTERECTOMY     ANAL RECTAL MANOMETRY N/A 12/28/2019   Procedure: ANO RECTAL MANOMETRY;  Surgeon: Mauri Pole, MD;  Location: [facility];  Service: Endoscopy;  Laterality: N/A;   BACK SURGERY     BUBBLE STUDY  03/14/2019   Procedure: BUBBLE STUDY;  Surgeon: [name], MD;  [facility];  Service: Cardiovascular;;   COLONOSCOPY     approx 10 years at [facility]   TEE WITHOUT CARDIOVERSION N/A 03/14/2019   Procedure: TRANSESOPHAGEAL ECHOCARDIOGRAM (TEE);  Surgeon:  [name] [name], MD;  Location: [facility] ENDOSCOPY;  Service: Cardiovascular;  Laterality: N/A;    OB History   No obstetric history on file.      Home Medications    Prior to Admission medications   Medication Sig Start Date End Date Taking? Authorizing Provider  amLODipine (NORVASC) 10 MG tablet Take 1 tablet (10 mg total) by mouth daily. 02/20/21   Dorna Mai, MD  [facility] ASPIRIN ADULT LOW DOSE 81 MG chewable tablet CHEW 1 TABLET BY MOUTH EVERY DAY 04/08/21   Dorna Mai, MD  labetalol (NORMODYNE) 100 MG tablet Take 1 tablet (100 mg total) by mouth 3 (three) times daily. 03/13/21   Dorna Mai, MD  losartan (COZAAR) 50 MG tablet TAKE 1 TABLET BY MOUTH TWICE A DAY 03/18/21   Dorna Mai, MD  rosuvastatin (CRESTOR) 40 MG tablet Take 1 tablet (40 mg total) by mouth daily. 12/11/20   Frann Rider, NP    Family History Family History  Problem Relation Age of Onset   Hypertension Mother    Diabetes Father    Hypertension Father    Colon polyps Father    Pancreatic cancer Sister    Heart attack Maternal Grandmother    Heart failure Maternal Grandmother    Hypertension Maternal Grandmother    Colon cancer Paternal Grandmother    Heart attack Paternal Grandfather  Social History Social History   Tobacco Use   Smoking status: Former    Types: Cigarettes    Quit date: 10/05/1980    Years since quitting: 40.6   Smokeless tobacco: Never  Substance Use Topics   Alcohol use: Yes    Alcohol/week: 1.0 standard drink    Types: 1 Glasses of wine per week    Comment: daily   Drug use: Never     Allergies   Codeine   Review of Systems Review of Systems Per HPI  Physical Exam Triage Vital Signs ED Triage Vitals [05/08/21 1611]  Enc Vitals Group     BP (!) 156/92     Pulse Rate (!) 102     Resp 18     Temp 97.9 F (36.6 C)     Temp Source Oral     SpO2 98 %     Weight      Height      Head Circumference      Peak Flow      Pain Score 0     Pain Loc       Pain Edu?      Excl. in [place]?    No data found.  Updated Vital Signs BP (!) 156/92 (BP Location: Left Arm)    Pulse (!) 102    Temp 97.9 F (36.6 C) (Oral)    Resp 18    SpO2 98%   Visual Acuity Right Eye Distance:   Left Eye Distance:   Bilateral Distance:    Right Eye Near:   Left Eye Near:    Bilateral Near:     Physical Exam Constitutional:      General: She is not in acute distress.    Appearance: Normal appearance. She is not toxic-appearing or diaphoretic.  HENT:     Head: Normocephalic and atraumatic.  Eyes:     Extraocular Movements: Extraocular movements intact.     Conjunctiva/sclera: Conjunctivae normal.  Pulmonary:     Effort: Pulmonary effort is normal.  Musculoskeletal:     Right ankle: Normal.     Left ankle: Swelling present. No deformity. Tenderness present. Normal range of motion. Anterior drawer test negative. Normal pulse.     Right foot: Normal.     Left foot: Normal capillary refill. Tenderness and bony tenderness present. No swelling, deformity or crepitus. Normal pulse.     Comments: Tenderness to palpation generalized throughout left ankle.  Patient does have moderate swelling noted to left lateral ankle.  She does have moderate range of motion still present.  Tenderness to palpation to medial left great toe.  No swelling noted to toe.  Patient does have bruising noted to dorsal surface of left fourth toe.  Full range of motion of toes.  Neurovascular intact.  Neurological:     General: No focal deficit present.     Mental Status: She is alert and oriented to person, place, and time. Mental status is at baseline.  Psychiatric:        Mood and Affect: Mood normal.        Behavior: Behavior normal.        Thought Content: Thought content normal.        Judgment: Judgment normal.     UC Treatments / Results  Labs (all labs ordered are listed, but only abnormal results are displayed) Labs Reviewed - No data to display  EKG   Radiology No  results found.  Procedures Procedures (including critical care time)  Medications Ordered in UC Medications - No data to display  Initial Impression / Assessment and Plan / UC Course  I have reviewed the triage vital signs and the nursing notes.  Pertinent labs & imaging results that were available during my care of the patient were reviewed by me and considered in my medical decision making (see chart for details).     I do think that patient needs further evaluation by x-ray of left foot and left ankle.  Do not have x-ray tech in urgent care today.  Outpatient imaging ordered at [place].  Patient placed in immobilization cam boot until otherwise advised as well as supplied with crutches.  RICE discussed with patient.  Patient will need to follow-up with provided contact information for orthopedist if fracture is present.  Discussed this with patient.  Patient voiced understanding.  Patient was agreeable with plan. Final Clinical Impressions(s) / UC Diagnoses   Final diagnoses:  Fall, initial encounter  Acute left ankle pain  Left foot pain     Discharge Instructions      A boot has been applied in urgent care as well as crutches.  Please do not bear any weight on left lower extremity until otherwise advised.  Please follow-up with provided contact information for orthopedist for further evaluation and management.  You will go to [facility] imaging today to receive an x-ray.  We will call if there are any abnormalities.  You may take Tylenol for pain.  Also use ice application.  There are no other safe alternatives for pain medication at this time as I cannot find the medication that you have previously taken.    ED Prescriptions   None    PDMP not reviewed this encounter.   Teodora Medici, [place] 05/08/21 [phone]

## 2021-05-08 NOTE — ED Triage Notes (Signed)
Pt c/o left foot injury that occurred today. Going down stairs on last step foot "slipped" and pt heard a "pop."

## 2021-05-08 NOTE — Discharge Instructions (Addendum)
A boot has been applied in urgent care as well as crutches.  Please do not bear any weight on left lower extremity until otherwise advised.  Please follow-up with provided contact information for orthopedist for further evaluation and management.  You will go to [facility] imaging today to receive an x-ray.  We will call if there are any abnormalities.  You may take Tylenol for pain.  Also use ice application.  There are no other safe alternatives for pain medication at this time as I cannot find the medication that you have previously taken.

## 2021-05-09 ENCOUNTER — Ambulatory Visit
Admission: RE | Admit: 2021-05-09 | Discharge: 2021-05-09 | Disposition: A | Discharge status: Home / Self Care | Payer: BC Managed Care – PPO | Source: Ambulatory Visit | Attending: Internal Medicine | Admitting: Internal Medicine

## 2021-05-09 NOTE — Progress Notes (Signed)
Carelink Summary Report / Loop Recorder 

## 2021-05-10 ENCOUNTER — Telehealth: Payer: Self-pay | Admitting: Physician Assistant

## 2021-05-10 NOTE — Telephone Encounter (Signed)
Patient called regarding xray results, recommended she follow up with ortho. Patient reports she was given contact info for ortho at [facility] visit and will give them a call. She will continue to wear CAM boot in the meantime.

## 2021-05-11 ENCOUNTER — Other Ambulatory Visit: Payer: Self-pay | Admitting: Internal Medicine

## 2021-05-11 DIAGNOSIS — I10 Essential (primary) hypertension: Secondary | ICD-10-CM

## 2021-05-19 ENCOUNTER — Other Ambulatory Visit: Payer: Self-pay | Admitting: Adult Health

## 2021-05-19 DIAGNOSIS — E785 Hyperlipidemia, unspecified: Secondary | ICD-10-CM

## 2021-06-03 ENCOUNTER — Ambulatory Visit (INDEPENDENT_AMBULATORY_CARE_PROVIDER_SITE_OTHER): Payer: Self-pay

## 2021-06-03 DIAGNOSIS — I639 Cerebral infarction, unspecified: Secondary | ICD-10-CM

## 2021-06-03 LAB — CUP PACEART REMOTE DEVICE CHECK
Date Time Interrogation Session: 20230212231351
Implantable Pulse Generator Implant Date: 20210525

## 2021-06-06 NOTE — Progress Notes (Signed)
Carelink Summary Report / Loop Recorder 

## 2021-06-13 ENCOUNTER — Other Ambulatory Visit: Payer: Self-pay | Admitting: *Deleted

## 2021-06-13 DIAGNOSIS — I10 Essential (primary) hypertension: Secondary | ICD-10-CM

## 2021-06-13 MED ORDER — LABETALOL HCL 100 MG PO TABS: 100.0000 mg | ORAL_TABLET | Freq: Three times a day (TID) | ORAL | 0 refills | Status: DC

## 2021-07-08 ENCOUNTER — Ambulatory Visit (INDEPENDENT_AMBULATORY_CARE_PROVIDER_SITE_OTHER): Payer: Self-pay

## 2021-07-08 DIAGNOSIS — I639 Cerebral infarction, unspecified: Secondary | ICD-10-CM

## 2021-07-08 LAB — CUP PACEART REMOTE DEVICE CHECK
Date Time Interrogation Session: 20230319230315
Implantable Pulse Generator Implant Date: 20210525

## 2021-07-18 NOTE — Progress Notes (Signed)
Carelink Summary Report / Loop Recorder 

## 2021-07-28 IMAGING — CT CT ANGIO HEAD
2 of 7 series · 8 of 33 positions shown · IV contrast (APPLIED)
Comparison: Head CT 03/05/2019

CLINICAL DATA: Intracranial hemorrhage follow

EXAM:
CT ANGIOGRAPHY HEAD AND NECK
TECHNIQUE: Multidetector CT imaging of the head and neck was performed using
the standard protocol during bolus administration of intravenous
contrast. Multiplanar CT image reconstructions and MIPs were
obtained to evaluate the vascular anatomy. Carotid stenosis
measurements (when applicable) are obtained utilizing NASCET
criteria, using the distal internal carotid diameter as the
denominator.
CONTRAST:  100mL OMNIPAQUE IOHEXOL 350 MG/ML SOLN

[Series 5: cta neck/head · axial · 0.44mm/px · z∈[-214,-84]mm · 2 of 197 slices shown]
[im 66/197  soft-tissue]
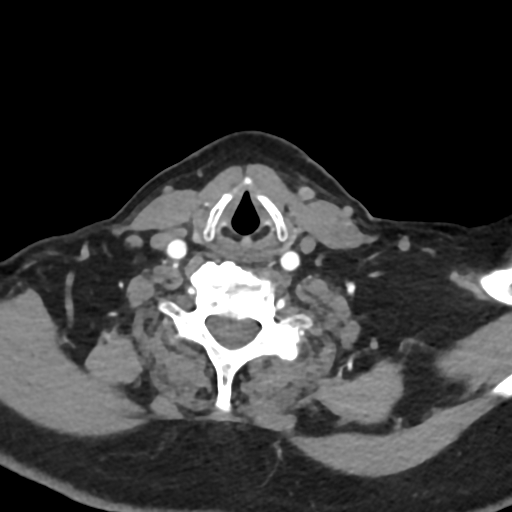
[im 131/197  soft-tissue]
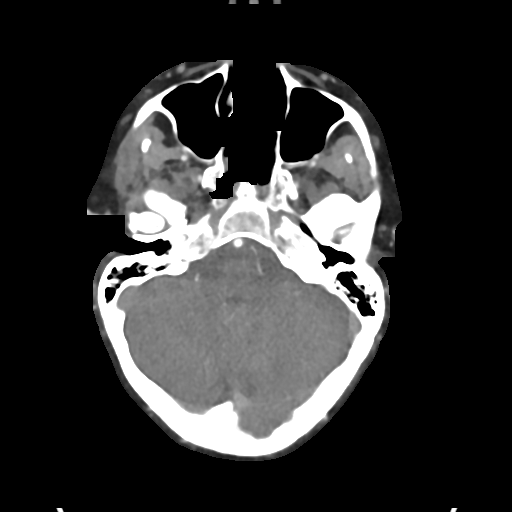

[Series 7: ax thins · axial · 0.39mm/px · z∈[-288,-8]mm · 6 of 395 slices shown]
[im 57/395  soft-tissue]
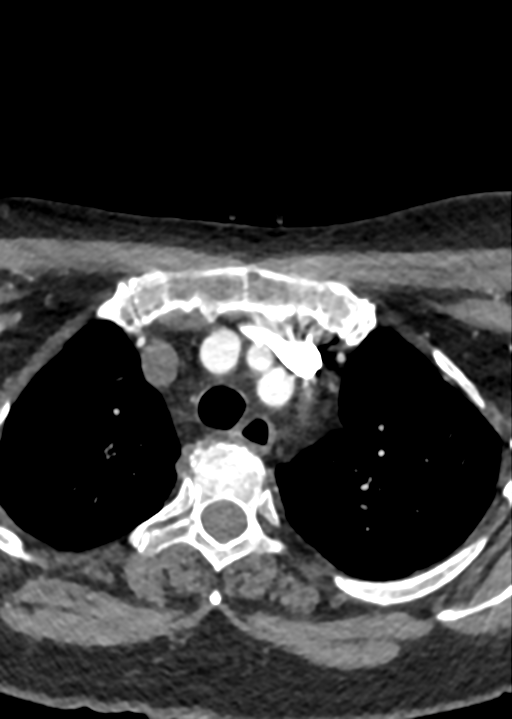
[im 113/395  bone]
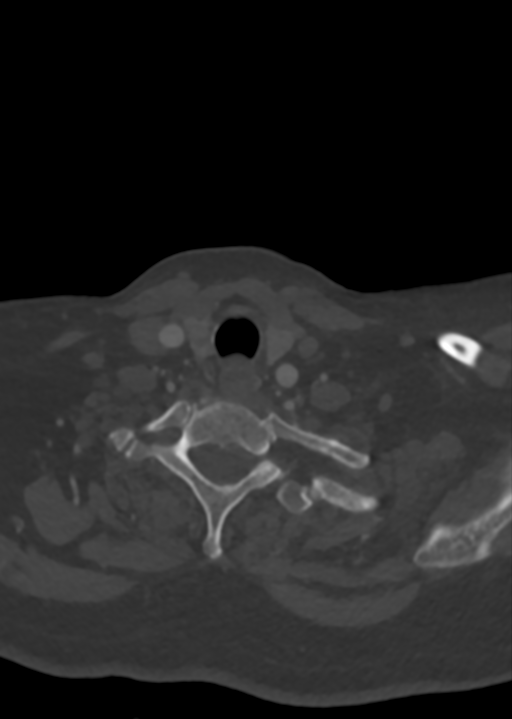
[im 169/395  soft-tissue]
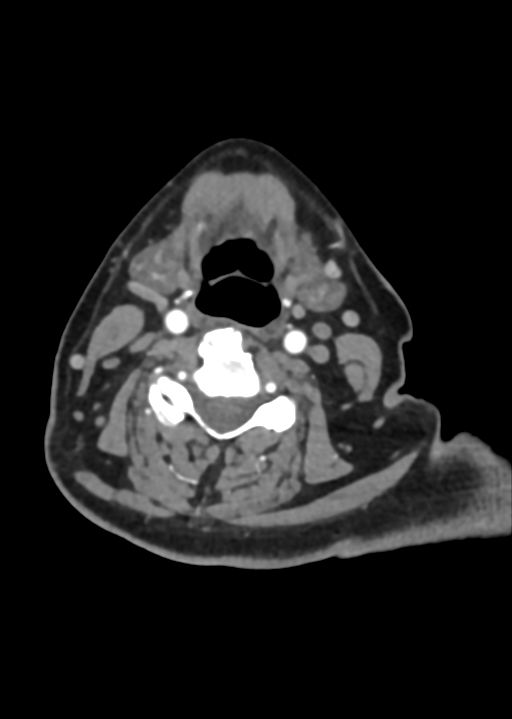
[im 226/395  bone]
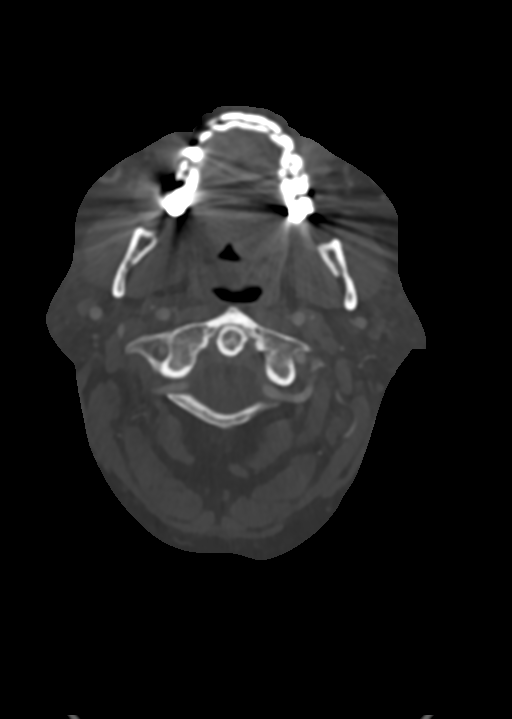
[im 282/395  soft-tissue]
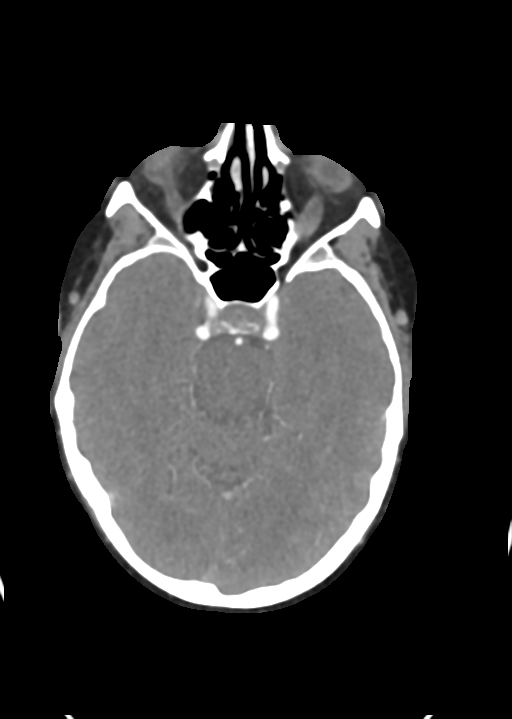
[im 338/395  bone]
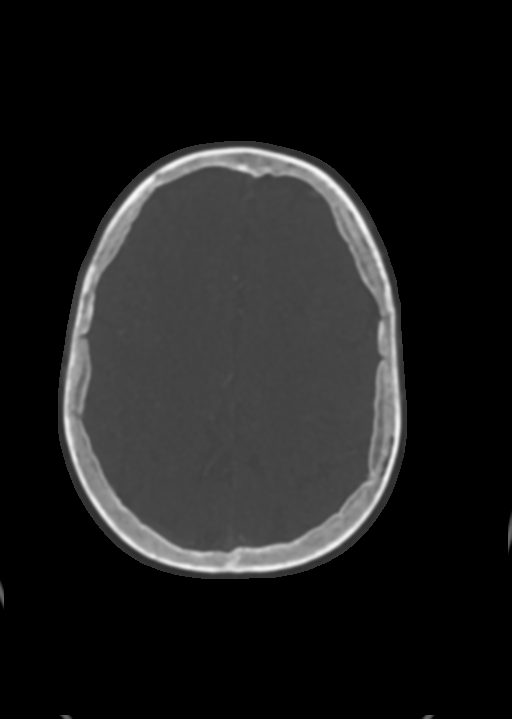

[8 of 33 positions shown; findings below may reference images not displayed]

FINDINGS: CTA NECK FINDINGS

SKELETON: There is no bony spinal canal stenosis. No lytic or
blastic lesion.

OTHER NECK: Normal pharynx, larynx and major salivary glands. No
cervical lymphadenopathy. Unremarkable thyroid gland.

UPPER CHEST: No pneumothorax or pleural effusion. No nodules or
masses.

AORTIC ARCH:

There is mild calcific atherosclerosis of the aortic arch. There is
no aneurysm, dissection or hemodynamically significant stenosis of
the visualized portion of the aorta. Conventional 3 vessel aortic
branching pattern. The visualized proximal subclavian arteries are
widely patent.

RIGHT CAROTID SYSTEM: No dissection, occlusion or aneurysm. Mild
atherosclerotic calcification at the carotid bifurcation without
hemodynamically significant stenosis.

LEFT CAROTID SYSTEM: No dissection, occlusion or aneurysm. Mild
atherosclerotic calcification at the carotid bifurcation without
hemodynamically significant stenosis.

VERTEBRAL ARTERIES: Left dominant configuration. Both origins are
clearly patent. There is no dissection, occlusion or flow-limiting
stenosis to the skull base (V1-V3 segments).

CTA HEAD FINDINGS

POSTERIOR CIRCULATION:

--Vertebral arteries: Normal V4 segments.

--Posterior inferior cerebellar arteries (PICA): Patent origins from
the vertebral arteries.

--Anterior inferior cerebellar arteries (AICA): Patent origins from
the basilar artery.

--Basilar artery: Normal.

--Superior cerebellar arteries: Normal.

--Posterior cerebral arteries: Normal. There are bilateral posterior
communicating arteries (p-comm) that partially supply the PCAs.

ANTERIOR CIRCULATION:

--Intracranial internal carotid arteries: Normal.

--Anterior cerebral arteries (ACA): Normal. Both A1 segments are
present. Patent anterior communicating artery (a-comm).

--Middle cerebral arteries (MCA): Normal.

VENOUS SINUSES: As permitted by contrast timing, patent.

ANATOMIC VARIANTS: None

Other: Unchanged size of left basal ganglia intraparenchymal
hematoma.

Review of the MIP images confirms the above findings.
IMPRESSION: 1. No aneurysm, dissection or hemodynamically significant stenosis
of the major cervical or intracranial arteries.
2. Unchanged size of hypertensive hemorrhage in left basal ganglia.

## 2021-08-12 ENCOUNTER — Ambulatory Visit (INDEPENDENT_AMBULATORY_CARE_PROVIDER_SITE_OTHER): Payer: BC Managed Care – PPO

## 2021-08-12 DIAGNOSIS — I639 Cerebral infarction, unspecified: Secondary | ICD-10-CM

## 2021-08-12 LAB — CUP PACEART REMOTE DEVICE CHECK
Date Time Interrogation Session: 20230421230615
Implantable Pulse Generator Implant Date: 20210525

## 2021-08-28 NOTE — Progress Notes (Signed)
Carelink Summary Report / Loop Recorder 

## 2021-09-09 ENCOUNTER — Other Ambulatory Visit: Payer: Self-pay | Admitting: Family Medicine

## 2021-09-09 DIAGNOSIS — I10 Essential (primary) hypertension: Secondary | ICD-10-CM

## 2021-09-09 NOTE — Telephone Encounter (Signed)
Medication Refill - Medication:  amLODipine (NORVASC) 10 MG tablet  Has the patient contacted their pharmacy? Yes.   Contact PCP  Preferred Pharmacy (with phone number or street name[facility]  [id number] [place] [place]  [phone] Fax:  [phone]     Has the patient been seen for an appointment in the last year OR does the patient have an upcoming appointment? Yes.    Agent: Please be advised that RX refills may take up to 3 business days. We ask that you follow-up with your pharmacy.

## 2021-09-10 MED ORDER — AMLODIPINE BESYLATE 10 MG PO TABS: 10.0000 mg | ORAL_TABLET | Freq: Every day | ORAL | 0 refills | Status: DC

## 2021-09-10 NOTE — Telephone Encounter (Signed)
Requested Prescriptions  Pending Prescriptions Disp Refills  . amLODipine (NORVASC) 10 MG tablet 30 tablet 0    Sig: Take 1 tablet (10 mg total) by mouth daily.     Cardiovascular: Calcium Channel Blockers 2 Failed - 09/09/2021  4:09 PM      Failed - Last BP in normal range    BP Readings from Last 1 Encounters:  05/08/21 (!) 156/92         Failed - Valid encounter within last 6 months    Recent Outpatient Visits          7 months ago Essential hypertension   Primary Care at [facility], MD   10 months ago Essential hypertension   Primary Care at [facility] [place], NP   1 year ago Essential hypertension   Primary Care at [facility], Amy J, NP   1 year ago Type 2 diabetes mellitus with other circulatory complication, without long-term current use of insulin [facility])   Primary Care at [facility], Bayard Beaver, MD   2 years ago Type 2 diabetes mellitus with other circulatory complication, without long-term current use of insulin [facility])   Primary Care at [facility], MD             Passed - Last Heart Rate in normal range    Pulse Readings from Last 1 Encounters:  05/08/21 (!) 102

## 2021-09-12 ENCOUNTER — Ambulatory Visit (INDEPENDENT_AMBULATORY_CARE_PROVIDER_SITE_OTHER): Payer: Self-pay

## 2021-09-12 DIAGNOSIS — I639 Cerebral infarction, unspecified: Secondary | ICD-10-CM

## 2021-09-12 LAB — CUP PACEART REMOTE DEVICE CHECK
Date Time Interrogation Session: 20230524234536
Implantable Pulse Generator Implant Date: 20210525

## 2021-09-13 ENCOUNTER — Ambulatory Visit: Payer: BC Managed Care – PPO | Admitting: Family Medicine

## 2021-09-24 NOTE — Progress Notes (Signed)
Carelink Summary Report / Loop Recorder 

## 2021-09-27 ENCOUNTER — Other Ambulatory Visit: Payer: Self-pay | Admitting: Family Medicine

## 2021-09-27 DIAGNOSIS — I10 Essential (primary) hypertension: Secondary | ICD-10-CM

## 2021-09-27 NOTE — Telephone Encounter (Signed)
Medication Refill - Medication: losartan (COZAAR) 50 MG tablet  Has the patient contacted their pharmacy? Yes.   Pt has moved to [place], [place]. Has appt w/ new dr at end of month.  Requesting 30 day supply sent to her pharmacy  Preferred Pharmacy (with phone number or street name[id number] [place] [place] been seen for an appointment in the last year OR does the patient have an upcoming appointment? Yes.  (10/2020)  Agent: Please be advised that RX refills may take up to 3 business days. We ask that you follow-up with your pharmacy.

## 2021-09-27 NOTE — Telephone Encounter (Signed)
Requested medication (s) are due for refill today: yes  Requested medication (s) are on the active medication list: yes  Last refill:  03/18/21  Future visit scheduled: no  Notes to clinic:  Unable to refill per protocol, pt request courtesy refill until they see new PCP, routing for provider approval.      Requested Prescriptions  Pending Prescriptions Disp Refills   losartan (COZAAR) 50 MG tablet 180 tablet 1    Sig: Take 1 tablet (50 mg total) by mouth 2 (two) times daily.     Cardiovascular:  Angiotensin Receptor Blockers Failed - 09/27/2021 10:10 AM      Failed - Cr in normal range and within 180 days    Creatinine, Ser  Date Value Ref Range Status  11/06/2020 0.75 0.57 - 1.00 mg/dL Final         Failed - K in normal range and within 180 days    Potassium  Date Value Ref Range Status  11/06/2020 4.0 3.5 - 5.2 mmol/L Final         Failed - Last BP in normal range    BP Readings from Last 1 Encounters:  05/08/21 (!) 156/92         Failed - Valid encounter within last 6 months    Recent Outpatient Visits           7 months ago Essential hypertension   Primary Care at [facility], MD   10 months ago Essential hypertension   Primary Care at [facility] [place], NP   1 year ago Essential hypertension   Primary Care at [facility], Amy J, NP   1 year ago Type 2 diabetes mellitus with other circulatory complication, without long-term current use of insulin [facility])   Primary Care at [facility], Bayard Beaver, MD   2 years ago Type 2 diabetes mellitus with other circulatory complication, without long-term current use of insulin [facility])   Primary Care at [facility], Bayard Beaver, MD              Passed - Patient is not pregnant

## 2021-09-30 ENCOUNTER — Other Ambulatory Visit: Payer: Self-pay | Admitting: Family Medicine

## 2021-09-30 DIAGNOSIS — I10 Essential (primary) hypertension: Secondary | ICD-10-CM

## 2021-09-30 NOTE — Telephone Encounter (Signed)
Pt states if she can get a 10 day supply- that will get her through to her appt w/ her new provider.  Please see additional refill request for labetalol. Pt been out of high bp med for a week.

## 2021-09-30 NOTE — Telephone Encounter (Signed)
Schedule appointment?

## 2021-10-01 MED ORDER — LOSARTAN POTASSIUM 50 MG PO TABS: 50.0000 mg | ORAL_TABLET | Freq: Two times a day (BID) | ORAL | 0 refills | Status: AC

## 2021-10-01 MED ORDER — LABETALOL HCL 100 MG PO TABS: 100.0000 mg | ORAL_TABLET | Freq: Three times a day (TID) | ORAL | 0 refills | Status: AC

## 2021-10-01 NOTE — Telephone Encounter (Signed)
Courtesy refill. Pt. Has moved out of the area. Requested Prescriptions  Pending Prescriptions Disp Refills  . labetalol (NORMODYNE) 100 MG tablet 90 tablet 0    Sig: Take 1 tablet (100 mg total) by mouth 3 (three) times daily.     Cardiovascular:  Beta Blockers Failed - 10/01/2021 11:44 AM      Failed - Last BP in normal range    BP Readings from Last 1 Encounters:  05/08/21 (!) 156/92         Failed - Valid encounter within last 6 months    Recent Outpatient Visits          7 months ago Essential hypertension   Primary Care at [facility], MD   10 months ago Essential hypertension   Primary Care at [facility] [place], NP   1 year ago Essential hypertension   Primary Care at [facility], Amy J, NP   2 years ago Type 2 diabetes mellitus with other circulatory complication, without long-term current use of insulin  [facility])   Primary Care at [facility], Bayard Beaver, MD   2 years ago Type 2 diabetes mellitus with other circulatory complication, without long-term current use of insulin [facility])   Primary Care at [facility], MD             Passed - Last Heart Rate in normal range    Pulse Readings from Last 1 Encounters:  05/08/21 (!) 102         . losartan (COZAAR) 50 MG tablet 60 tablet 0    Sig: Take 1 tablet (50 mg total) by mouth 2 (two) times daily.     Cardiovascular:  Angiotensin Receptor Blockers Failed - 10/01/2021 11:44 AM      Failed - Cr in normal range and within 180 days    Creatinine, Ser  Date Value Ref Range Status  11/06/2020 0.75 0.57 - 1.00 mg/dL Final         Failed - K in normal range and within 180 days    Potassium  Date Value Ref Range Status  11/06/2020 4.0 3.5 - 5.2 mmol/L Final         Failed - Last BP in normal range    BP Readings from Last 1 Encounters:  05/08/21 (!) 156/92         Failed - Valid encounter within last 6 months    Recent Outpatient  Visits          7 months ago Essential hypertension   [facility] Care at [facility], MD   10 months ago Essential hypertension   Primary Care at [facility], [place], NP   1 year ago Essential hypertension   Primary Care at [facility], Amy J, NP   2 years ago Type 2 diabetes mellitus with other circulatory complication, without long-term current use of insulin [facility])   Primary Care at [facility], Bayard Beaver, MD   2 years ago Type 2 diabetes mellitus with other circulatory complication, without long-term current use of insulin [facility])   Primary Care at [facility], Bayard Beaver, MD             Passed - Patient is not pregnant      Refused Prescriptions Disp Refills  . labetalol (NORMODYNE) 100 MG tablet [Pharmacy Med Name: LABETALOL HCL 100 MG TABLET] 270 tablet 0    Sig: TAKE 1 TABLET BY  MOUTH 3 TIMES DAILY.     Cardiovascular:  Beta Blockers Failed - 10/01/2021 11:44 AM      Failed - Last BP in normal range    BP Readings from Last 1 Encounters:  05/08/21 (!) 156/92         Failed - Valid encounter within last 6 months    Recent Outpatient Visits          7 months ago Essential hypertension   Primary Care at [facility], MD   10 months ago Essential hypertension   Primary Care at [facility], [place], NP   1 year ago Essential hypertension   Primary Care at [facility], Amy J, NP   2 years ago Type 2 diabetes mellitus with other circulatory complication, without long-term current use of insulin [facility])   Primary Care at [facility], Bayard Beaver, MD   2 years ago Type 2 diabetes mellitus with other circulatory complication, without long-term current use of insulin [facility])   Primary Care at [facility], MD             Passed - Last Heart Rate in normal range    Pulse Readings from Last 1 Encounters:  05/08/21 (!) 102

## 2021-10-01 NOTE — Addendum Note (Signed)
Addended by: Durwin Nora on: 10/01/2021 11:44 AM   Modules accepted: Orders

## 2021-10-01 NOTE — Telephone Encounter (Signed)
Pt was advised she needs an appt and refill request was denied / she stated that she has moved out of town and needs a courtesy refill of a 30 day supply to get her to her next appt with her new PCP out of town / please advise asap and pt asked for a call back about this matter  Pt is requesting losartan (COZAAR) 50 MG tablet  and  labetalol (NORMODYNE) '100MG'$  Tablet  [facility]  [id number] [place]  [phone] Fax:  [phone]   Pt has been without medication

## 2021-10-02 ENCOUNTER — Other Ambulatory Visit: Payer: Self-pay | Admitting: Family Medicine

## 2021-10-02 DIAGNOSIS — I10 Essential (primary) hypertension: Secondary | ICD-10-CM

## 2021-10-02 NOTE — Telephone Encounter (Signed)
Requested medication (s) are due for refill today: Due 10/11/21  Requested medication (s) are on the active medication list: yes    Last refill: 09/10/21  #30  0 refills  Future visit scheduled No  Notes to clinic: Pt has moved, requesting refill to hold her over until new PCP appt in 2 weeks. Courtesy refill already provided. Please review. Thank you.  Requested Prescriptions  Pending Prescriptions Disp Refills   amLODipine (NORVASC) 10 MG tablet [Pharmacy Med Name: AMLODIPINE BESYLATE 10 MG TAB] 30 tablet 0    Sig: TAKE 1 TABLET BY MOUTH EVERY DAY     Cardiovascular: Calcium Channel Blockers 2 Failed - 10/02/2021 12:34 PM      Failed - Last BP in normal range    BP Readings from Last 1 Encounters:  05/08/21 (!) 156/92         Failed - Valid encounter within last 6 months    Recent Outpatient Visits           7 months ago Essential hypertension   Primary Care at [facility], MD   11 months ago Essential hypertension   Primary Care at [facility] [place], NP   1 year ago Essential hypertension   Primary Care at [facility], Amy J, NP   2 years ago Type 2 diabetes mellitus with other circulatory complication, without long-term current use of insulin [facility])   Primary Care at [facility], Bayard Beaver, MD   2 years ago Type 2 diabetes mellitus with other circulatory complication, without long-term current use of insulin [facility])   Primary Care at [facility], Bayard Beaver, MD              Passed - Last Heart Rate in normal range    Pulse Readings from Last 1 Encounters:  05/08/21 (!) 102

## 2021-10-07 ENCOUNTER — Other Ambulatory Visit: Payer: Self-pay | Admitting: Family Medicine

## 2021-10-07 DIAGNOSIS — I10 Essential (primary) hypertension: Secondary | ICD-10-CM

## 2021-10-07 NOTE — Telephone Encounter (Signed)
Medication Refill - Medication: amLODipine (NORVASC) 10 MG tablet  Has the patient contacted their pharmacy? Yes.     Preferred Pharmacy (with phone number or street name):  [id number] [place]:  [phone] Fax:  [phone]    Has the patient been seen for an appointment in the last year OR does the patient have an upcoming appointment? Yes.    Agent: Please be advised that RX refills may take up to 3 business days. We ask that you follow-up with your pharmacy.  Patient called in asking for one more refill as she had an appointment scheduled with her new provider but it was cancelled due to the doctor being sick. She has been rescheduled to next month so she just needed enough to get to next month. Please assist patient further.

## 2021-10-08 NOTE — Telephone Encounter (Signed)
Pharmacy called re this rx was approved earlier but the pt is requesting again. There is no receipt received notice. [facility] states [facility] did not receive [facility], will send again.

## 2021-10-08 NOTE — Telephone Encounter (Signed)
Requested medication (s) are due for refill today: yes  Requested medication (s) are on the active medication list: yes  Last refill:  09/10/21 330 with 0 RF (curtesy refill)  Future visit scheduled: No  Notes to clinic:  Pt already had a curtesy refill. She made appt and beside it it says canceled/moved. There has not been another appt. Please assess.      Requested Prescriptions  Pending Prescriptions Disp Refills   amLODipine (NORVASC) 10 MG tablet 30 tablet 0    Sig: Take 1 tablet (10 mg total) by mouth daily.     Cardiovascular: Calcium Channel Blockers 2 Failed - 10/07/2021 11:42 AM      Failed - Last BP in normal range    BP Readings from Last 1 Encounters:  05/08/21 (!) 156/92         Failed - Valid encounter within last 6 months    Recent Outpatient Visits           7 months ago Essential hypertension   [facility] at [facility], MD   11 months ago Essential hypertension   Primary Care at [facility] [place], NP   1 year ago Essential hypertension   Primary Care at [facility], Amy J, NP   2 years ago Type 2 diabetes mellitus with other circulatory complication, without long-term current use of insulin [facility])   Primary Care at [facility], Bayard Beaver, MD   2 years ago Type 2 diabetes mellitus with other circulatory complication, without long-term current use of insulin [facility])   Primary Care at [facility], MD              Passed - Last Heart Rate in normal range    Pulse Readings from Last 1 Encounters:  05/08/21 (!) 102

## 2021-10-09 NOTE — Telephone Encounter (Signed)
Patient has called back in requesting one last refill on her amlodipine '10mg'$ . She was suppose to see her new doctor on Monday but the new provider called in sick and had to reschedule her in July. She just needs another refill to last her until mid July. [id number] [place]  [phone] Fax:  [phone]

## 2021-10-10 NOTE — Telephone Encounter (Signed)
Pt is calling back to find out the status of her previous refill requests. Please advise.

## 2021-10-11 ENCOUNTER — Other Ambulatory Visit: Payer: Self-pay | Admitting: Family Medicine

## 2021-10-11 DIAGNOSIS — I10 Essential (primary) hypertension: Secondary | ICD-10-CM

## 2021-10-11 MED ORDER — AMLODIPINE BESYLATE 10 MG PO TABS: 10.0000 mg | ORAL_TABLET | Freq: Every day | ORAL | 0 refills | Status: AC

## 2021-10-11 NOTE — Telephone Encounter (Signed)
Medication Refill - Medication: amLODipine (NORVASC) 10 MG tablet Refill request put in 06/19, patient still hasn't received this, pharmacy says its not there Patient is all out of this medication Has the patient contacted their pharmacy? yes (Agent: If no, request that the patient contact the pharmacy for the refill. If patient does not wish to contact the pharmacy document the reason why and proceed with request.) (Agent: If yes, when and what did the pharmacy advise?)contact pcp  Preferred Pharmacy (with phone number or street name): [phone] [place], [place] - [address]  Phone:  [phone] Fax:  [phone] Has the patient been seen for an appointment in the last year OR does the patient have an upcoming appointment? no  Agent: Please be advised that RX refills may take up to 3 business days. We ask that you follow-up with your pharmacy.

## 2021-10-15 ENCOUNTER — Ambulatory Visit (INDEPENDENT_AMBULATORY_CARE_PROVIDER_SITE_OTHER): Payer: Medicare HMO

## 2021-10-15 DIAGNOSIS — I639 Cerebral infarction, unspecified: Secondary | ICD-10-CM | POA: Diagnosis not present

## 2021-10-15 LAB — CUP PACEART REMOTE DEVICE CHECK
Date Time Interrogation Session: 20230626232409
Implantable Pulse Generator Implant Date: 20210525

## 2021-10-24 ENCOUNTER — Other Ambulatory Visit: Payer: Self-pay | Admitting: Family Medicine

## 2021-10-24 DIAGNOSIS — I10 Essential (primary) hypertension: Secondary | ICD-10-CM

## 2021-10-25 ENCOUNTER — Other Ambulatory Visit: Payer: Self-pay | Admitting: Family Medicine

## 2021-10-25 DIAGNOSIS — I10 Essential (primary) hypertension: Secondary | ICD-10-CM

## 2021-10-28 ENCOUNTER — Ambulatory Visit: Payer: Medicare HMO | Admitting: Family Medicine

## 2021-11-06 NOTE — Progress Notes (Signed)
Carelink Summary Report / Loop Recorder 

## 2021-11-18 ENCOUNTER — Ambulatory Visit (INDEPENDENT_AMBULATORY_CARE_PROVIDER_SITE_OTHER): Payer: Medicare HMO

## 2021-11-18 DIAGNOSIS — I639 Cerebral infarction, unspecified: Secondary | ICD-10-CM

## 2021-11-18 LAB — CUP PACEART REMOTE DEVICE CHECK
Date Time Interrogation Session: 20230729232607
Implantable Pulse Generator Implant Date: 20210525

## 2021-12-09 NOTE — Progress Notes (Unsigned)
[facility] [address] [place]. [place] [address] [phone]       STROKE FOLLOW UP NOTE  [name] Date of Birth:  02-03-1956 Medical Record Number:  [id number]   Reason for Referral:  stroke follow up    CHIEF COMPLAINT:  No chief complaint on file.    HPI:  Update 12/09/2021 JM: Patient returns for 1 year stroke follow-up.  Overall stable without new stroke/TIA symptoms.  Residual deficits stable.  Compliant on aspirin atorvastatin, denies side effects Blood pressure today *** Recent lab work by PCP satisfactory Loop recorder has not shown atrial fibrillation thus far      History provided for reference purposes only Update 12/06/2020 JM: Mrs. Belinda Banks returns for 4-monthstroke follow-up unaccompanied.  Overall doing well.  Denies new stroke/TIA symptoms.  She has stopped pursuing Social Security disability due to difficulties getting this approved - she will be 66 next year and will be eligible for Medicare which was her main reason for pursuing.  Compliant on aspirin 81 mg daily and atorvastatin 40 mg daily-denies associated side effects.  Blood pressure today 112/77.  Routinely monitors at home and typically stable.  Diabetes stable recent A1c 5.3 (10/2020) on nonpharmacological management.  Loop recorder has not shown atrial fibrillation.  She does not question indication/need for continued monitoring - reached out to cardiology last month but was not contacted back.  No new concerns at this time.   Update 06/07/2020 JM: Mrs. Belinda Banks returns for 635-monthtroke follow-up accompanied by her husband.   Reports residual of visual impairment, alexia, left hand tremor and cognitive impairment relatively stable. She continues to apply for SSD due to residual deficits -difficulty with reading, performing multistep tasks, processing deficit and multitasking Denies new stroke/TIA symptoms  Reports compliance with aspirin 81 mg daily and  atorvastatin 40 mg daily Blood pressure 118/76 -monitors at home and typically stable She does not routinely check glucose levels as this has been stable - recent A1c 5.1 (03/2020) Loop recorder has not shown atrial fibrillation thus far  No concerns at this time  Update 12/06/2019 JM: Ms. McPostereturns for stroke follow-up accompanied by her husband. Residual deficits of short-term memory impairment and visual impairment; she questions possible worsening vs being more aware of deficits  Prism glasses provided some benefit in regards to reading but continues to have difficulty with comprehension and understanding what she is reading.  She is not able to read at all without use of prism glasses. 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  Completed STD and only received LTD for 2 days - she has since retired and currently planning on applying for social security disability.  Denies new stroke/TIA symptoms Remains on aspirin 81 mg daily and atorvastatin 40 mg daily for secondary stroke prevention without side effects Blood pressure today 120/84 Glucose levels have been stable -reports PCP discontinued metformin due to improvement of A1c HTN, HLD and DM managed by PCP Loop recorder placed on 09/22/2019 which has not shown atrial fibrillation thus far  Other concerns She does report cluster periods of difficulty staying asleep does not have issue falling asleep She has trialed different types of melatonin but would only take intermittently as she did not find benefit Denies snoring, morning headaches, or witnessed apnea Occasional day time fatigue with occasional afternoon naps but denies excessive fatigue No further concerns at this time  Update 06/24/2019  JM: [name] is a 66 year old female who is being seen today, 07/14/2019, for stroke follow-up.  She continues to have residual visual difficulties and  was evaluated by ophthalmology on 06/03/2019 which showed bilateral inferior altitudinal visual field defects as well as bilateral homonymous inferior quadrantanopia.  She was referred to low vision specialist at [facility] low vision center for further evaluation.  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  Hallucinations likely release hallucinations as they appeared on her blind areas - have improved since prior visit.  She does plan on follow-up with her life specialist in 3 months.  She does report mild improvement of vision but continues to have difficulty with reading and remembering numbers.  She also complains of short-term memory deficit.  She has completed outpatient therapy but continues to do exercises at home.  She continues on short-term disability due to ongoing visual deficits. She did have follow-up with cardiology who recommended placement of loop recorder due to cryptogenic stroke etiology based on further recommendations at today's visit.  Continues on aspirin 81 mg daily without bleeding or bruising.  Repeat LFTs normalized and recommended initiating atorvastatin 20 mg daily.  Recent repeat LFT by PCP remains WNL.  Blood pressure today 111/74.  No further concerns at this time.   Initial visit 04/20/2019 JM: [name] is a 66 year old female who is being seen today for hospital follow-up accompanied by her husband.  Residual deficits of visual impairment, cognitive impairment, aphasia and decreased right hand dexterity.  She has difficulty fully explaining visual impairment as she denies diplopia or blurred vision but describes it as distortion of images and words which is worsened if she is concentrating for prolonged period of time.  Vision will improve after closing and resting eyes.  She also endorses occasional visual hallucinations.  She has not had evaluation by  ophthalmology at this time and is requesting evaluation by neuro-ophthalmology as recommended by therapy.  She continues to work with outpatient speech and OT with ongoing improvement.  She has not been able to return to work due to [employer] of job requires computer and paperwork.  She is currently receiving short-term disability from hospitalization and is requesting continuation of short-term disability through our office.  She has not established care with PCP at this time but does have initial appointment in approximately 1 month.  She also endorses occasional progressive symptoms of left hand numbness slowly progressing up arm and then will begin tremoring lasting 30 seconds to 1 minute.  Usually occurs after activity especially after shower.  She has continued on aspirin 81 mg daily without bleeding or bruising.  She has not had repeat LFTs as she does not have established PCP at this time therefore currently not on statin therapy.  Blood pressure today satisfactory at 117/79.  Review of vasculitis work-up all unremarkable.  She reports decreasing EtOH use and has only had approximately 3 drinks since discharge.  No further concerns at this time.  Stroke admission 03/05/2019: Ms. KERLY RIGSBEE is a 66 y.o. female with history of HTN (no recent medical f/u) who presented to [facility] ED as a code stroke with c/o slurred speech, right sided weakness, facial droop, BP 230/158 and glucose 238.  CT head showed small ICH centered at the left lentiform nucleus estimated volume 3.8 cc felt secondary to hypertensive.  CTA head/neck unchanged size of hypertensive hemorrhage in left basal ganglia without evidence of  aneurysm or AVM.  2D echo normal EF without cardiac source of embolus identified.  COVID-19 negative.  Hypertensive emergency on arrival treated with Cleviprex with increasing Cozaar, initiating labetalol and continuation of Norvasc.   Alcohol abuse 4-6 drinks per day placed on B1/FA/MVI and advised to drink no  more than one alcoholic beverage per day.  New diagnosis of uncontrolled DM with A1c 7.6 and initiated Metformin 500 mg twice daily and advised to follow-up with PCP outpatient.  LDL 106 and initiated atorvastatin 20 mg daily.  Other stroke risk factors include former tobacco use and sedentary lifestyle.  Residual deficits of right facial droop, right pronator drift with decreased right hand dexterity and discharged home in stable condition with outpatient therapies.   Stroke admission 03/11/2019: She returned on 03/11/2019 with complaints of generalized weakness, nausea and slight confusion.  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  MRI w/wo contrast showed multiple acute to early subacute cerebral infarcts bilaterally largest in posterior left MCA region, stable appearance of subacute hemorrhage, and moderate chronic small vessel ischemic disease with multiple chronic lacunar infarcts.  CTA head and neck and 2D echo not repeated as performed on prior admission.  Underwent TEE and was negative for vegetations, embolus or PFO.  Discontinued atorvastatin due to elevated LFTs.  Evaluated by Dr. Leonie Man who felt initial infarct was hemorrhagic rather than primary intracerebral hemorrhage.  Recommended initiating aspirin 81 mg daily for stroke prevention.  Felt to be not a candidate for long-term anticoagulation at that time but may consider loop recorder placement in the future for further embolic work-up.  Vasculitis work-up pending at discharge.  Residual deficits of right peripheral partial field loss, mild right lower facial asymmetry, diminished fine finger movements on the right, mild weakness of right grip and mild right hip flexor and ankle dorsiflexion weakness.  Discharged home with recommendation of outpatient therapy.       ROS:   14 system review of  systems performed and negative with exception of those listed in HPI  PMH:  Past Medical History:  Diagnosis Date   Cataract    Chronic kidney disease    Diabetes mellitus without complication ([place])    type 2   Hyperlipidemia    Hypertension    controlled   Stroke ([place])    x 2 02/2019    PSH:  Past Surgical History:  Procedure Laterality Date   ABDOMINAL HYSTERECTOMY     ANAL RECTAL MANOMETRY N/A 12/28/2019   Procedure: ANO RECTAL MANOMETRY;  Surgeon: Mauri Pole, MD;  Location: [facility];  Service: Endoscopy;  Laterality: N/A;   BACK SURGERY     BUBBLE STUDY  03/14/2019   Procedure: BUBBLE STUDY;  Surgeon: [name], MD;  Location: [place];  Service: Cardiovascular;;   COLONOSCOPY     approx 10 years at [facility]   TEE WITHOUT CARDIOVERSION N/A 03/14/2019   Procedure: TRANSESOPHAGEAL ECHOCARDIOGRAM (TEE);  Surgeon: [name] [name], MD;  Location: [facility] ENDOSCOPY;  Service: Cardiovascular;  Laterality: N/A;    Social History:  Social History   Socioeconomic History   Marital status: Married    Spouse name: Not on file   Number of children: 2   Years of education: Not on file   Highest education level: Not on file  Occupational History   Not on file  Tobacco Use   Smoking status: Former  Types: Cigarettes    Quit date: 10/05/1980    Years since quitting: 41.2   Smokeless tobacco: Never  Substance and Sexual Activity   Alcohol use: Yes    Alcohol/week: 1.0 standard drink of alcohol    Types: 1 Glasses of wine per week    Comment: daily   Drug use: Never   Sexual activity: Not on file  Other Topics Concern   Not on file  Social History Narrative   Not on file   Social Determinants of Health   Financial Resource Strain: Not on file  Food Insecurity: Not on file  Transportation Needs: Not on file  Physical Activity: Not on file  Stress: Not on file  Social Connections: Not on file  Intimate Partner Violence: Not on file    Family  History:  Family History  Problem Relation Age of Onset   Hypertension Mother    Diabetes Father    Hypertension Father    Colon polyps Father    Pancreatic cancer Sister    Heart attack Maternal Grandmother    Heart failure Maternal Grandmother    Hypertension Maternal Grandmother    Colon cancer Paternal Grandmother    Heart attack Paternal Grandfather     Medications:   Current Outpatient Medications on File Prior to Visit  Medication Sig Dispense Refill   amLODipine (NORVASC) 10 MG tablet Take 1 tablet (10 mg total) by mouth daily. 30 tablet 0   [facility] ASPIRIN ADULT LOW DOSE 81 MG chewable tablet CHEW 1 TABLET BY MOUTH EVERY DAY 90 tablet 1   labetalol (NORMODYNE) 100 MG tablet Take 1 tablet (100 mg total) by mouth 3 (three) times daily. 90 tablet 0   losartan (COZAAR) 50 MG tablet Take 1 tablet (50 mg total) by mouth 2 (two) times daily. 60 tablet 0   rosuvastatin (CRESTOR) 40 MG tablet TAKE 1 TABLET BY MOUTH EVERY DAY 30 tablet 6   No current facility-administered medications on file prior to visit.    Allergies:   Allergies  Allergen Reactions   Codeine Other (See Comments)    Made her loopy     Physical Exam  There were no vitals filed for this visit.   There is no height or weight on file to calculate BMI. No results found.   General: well developed, well nourished,  pleasant middle-age Caucasian female, seated, in no evident distress Neck: supple with no carotid or supraclavicular bruits Cardiovascular: regular rate and rhythm, no murmurs Vascular:  Normal pulses all extremities   Neurologic Exam Mental Status: Awake and fully alert.  Fluent speech and language.  Oriented to place and time. Recent memory subjectively diminished and remote memory intact. Attention span, concentration and fund of knowledge appropriate during visit. Mood and affect appropriate.  Cranial Nerves: Pupils equal, briskly reactive to light. Extraocular movements full. Visual fields  full to confrontation although decreased vision b/l left lower periphery - can see fingers move but unable to tell how may holding up.  Hearing intact. Facial sensation intact. Face, tongue, palate moves normally and symmetrically.  Motor: Normal bulk and tone.  Full strength in all tested extremities Sensory.: intact to touch , pinprick , position and vibratory sensation.  Coordination: Rapid alternating movements normal in all extremities.  Finger-to-nose and heel-to-shin performed accurately bilaterally.  Slight left hand tremor with outstretched arms- stable.  No tremor at rest, bradykinesia or cogwheel rigidity Gait and Station: Arises from chair without difficulty. Stance is normal. Gait demonstrates normal stride length  and balance without use of assistive device.  Tandem walk and heel toe without difficulty. Reflexes: 1+ and symmetric. Toes downgoing.         ASSESSMENT: Belinda Banks is a 66 y.o. year old female with recent admission on 03/05/2019 for left basal ganglia hemorrhage felt at that time to be hypertensive in etiology and then returned on 03/11/2019 with generalized weakness, nausea and slight confusion and found to have multiple additional acute and subacute bilateral cerebral infarcts embolic pattern secondary to unknown source.  Septic emboli and vasculitis ruled out.  Loop recorder placed on 09/22/2019 due to cryptogenic etiology.  Vascular risk factors include HTN, HLD, DM, history of EtOH use, and DM.      PLAN:  Multiple strokes as above:  Residual deficits: Visual field deficit, visual spatial deficit, alexia, mild left hand tremor and short-term memory impairment.  Stable.  Continue aspirin 81 mg daily and atorvastatin 40 mg daily for secondary stroke prevention.  Loop recorder placed 09/22/2019: Has not shown atrial fibrillation thus far; continue to monitor by cardiology. Recommend continued monitoring at this time Discussed secondary stroke prevention measures and  portance of close PCP follow-up for aggressive stroke risk factor management including BP goal<130/90, HLD with LDL goal<70 and DM with A1c.<7  Stroke labs 10/2021: LDL 57, A1c 5.7    Follow up in 1 year or call earlier if needed. If remains stable, may follow up as needed   CC:  [facility] provider: Dr. Dineen Kid, Clyde Canterbury, MD    I spent 26 minutes of face-to-face and non-face-to-face time with patient.  This included previsit chart review, lab review, study review, order entry, electronic health record documentation, patient education regarding prior strokes, residual deficits, review and monitoring of loop recorder and indication for continued monitoring, secondary stroke prevention measures and importance of managing stroke risk factors, and answered all other questions to patients satisfaction  Frann Rider, AGNP-BC  [facility] [address] [place],  [address]  Phone [phone] Fax [phone] Note: This document was prepared with digital dictation and possible smart phrase technology. Any transcriptional errors that result from this process are unintentional.

## 2021-12-10 ENCOUNTER — Ambulatory Visit: Payer: Medicare HMO | Admitting: Adult Health

## 2021-12-10 ENCOUNTER — Encounter: Payer: Self-pay | Admitting: Adult Health

## 2021-12-10 VITALS — BP 115/73 | HR 67 | Ht 69.0 in | Wt 182.1 lb

## 2021-12-10 DIAGNOSIS — I639 Cerebral infarction, unspecified: Secondary | ICD-10-CM | POA: Diagnosis not present

## 2021-12-10 DIAGNOSIS — I610 Nontraumatic intracerebral hemorrhage in hemisphere, subcortical: Secondary | ICD-10-CM

## 2021-12-10 NOTE — Patient Instructions (Signed)
Continue aspirin 81 mg daily  and atorvastatin for secondary stroke prevention  Loop recorder will continue to be monitored by cardiology  Continue to follow up with PCP regarding cholesterol, diabetes and blood pressure management  Maintain strict control of hypertension with blood pressure goal below 130/90, diabetes with hemoglobin A1c goal below 7.0 % and cholesterol with LDL cholesterol (bad cholesterol) goal below 70 mg/dL.   Signs of a Stroke? Follow the BEFAST method:  Balance Watch for a sudden loss of balance, trouble with coordination or vertigo Eyes Is there a sudden loss of vision in one or both eyes? Or double vision?  Face: Ask the person to smile. Does one side of the face droop or is it numb?  Arms: Ask the person to raise both arms. Does one arm drift downward? Is there weakness or numbness of a leg? Speech: Ask the person to repeat a simple phrase. Does the speech sound slurred/strange? Is the person confused ? Time: If you observe any of these signs, call 911.        Thank you for coming to see [place] at [facility]. I hope we have been able to provide you high quality care today.  You may receive a patient satisfaction survey over the next few weeks. We would appreciate your feedback and comments so that we may continue to improve ourselves and the health of our patients.

## 2021-12-19 NOTE — Progress Notes (Signed)
Carelink Summary Report / Loop Recorder 

## 2021-12-23 LAB — CUP PACEART REMOTE DEVICE CHECK
Date Time Interrogation Session: 20230831234702
Implantable Pulse Generator Implant Date: 20210525

## 2021-12-24 ENCOUNTER — Ambulatory Visit (INDEPENDENT_AMBULATORY_CARE_PROVIDER_SITE_OTHER): Payer: Medicare HMO

## 2021-12-24 DIAGNOSIS — I6300 Cerebral infarction due to thrombosis of unspecified precerebral artery: Secondary | ICD-10-CM

## 2022-01-16 NOTE — Progress Notes (Signed)
Carelink Summary Report / Loop Recorder 

## 2022-01-22 LAB — CUP PACEART REMOTE DEVICE CHECK
Date Time Interrogation Session: 20231003233126
Implantable Pulse Generator Implant Date: 20210525

## 2022-01-27 ENCOUNTER — Ambulatory Visit (INDEPENDENT_AMBULATORY_CARE_PROVIDER_SITE_OTHER): Payer: Medicare HMO

## 2022-01-27 DIAGNOSIS — I6300 Cerebral infarction due to thrombosis of unspecified precerebral artery: Secondary | ICD-10-CM | POA: Diagnosis not present

## 2022-02-05 NOTE — Progress Notes (Signed)
Carelink Summary Report / Loop Recorder 

## 2022-03-03 ENCOUNTER — Ambulatory Visit (INDEPENDENT_AMBULATORY_CARE_PROVIDER_SITE_OTHER): Payer: Medicare HMO

## 2022-03-03 DIAGNOSIS — I639 Cerebral infarction, unspecified: Secondary | ICD-10-CM

## 2022-03-04 LAB — CUP PACEART REMOTE DEVICE CHECK
Date Time Interrogation Session: 20231112231945
Implantable Pulse Generator Implant Date: 20210525

## 2022-04-07 ENCOUNTER — Ambulatory Visit (INDEPENDENT_AMBULATORY_CARE_PROVIDER_SITE_OTHER): Payer: Medicare HMO

## 2022-04-07 DIAGNOSIS — I639 Cerebral infarction, unspecified: Secondary | ICD-10-CM

## 2022-04-08 LAB — CUP PACEART REMOTE DEVICE CHECK
Date Time Interrogation Session: 20231217231348
Implantable Pulse Generator Implant Date: 20210525

## 2022-04-15 NOTE — Progress Notes (Signed)
Carelink Summary Report / Loop Recorder 

## 2022-05-12 ENCOUNTER — Ambulatory Visit: Payer: Medicare HMO | Attending: Cardiology

## 2022-05-12 DIAGNOSIS — I639 Cerebral infarction, unspecified: Secondary | ICD-10-CM

## 2022-05-13 LAB — CUP PACEART REMOTE DEVICE CHECK
Date Time Interrogation Session: 20240119232343
Implantable Pulse Generator Implant Date: 20210525

## 2022-05-14 NOTE — Progress Notes (Signed)
Carelink Summary Report / Loop Recorder

## 2022-06-16 ENCOUNTER — Ambulatory Visit: Payer: Medicare HMO

## 2022-06-16 DIAGNOSIS — I639 Cerebral infarction, unspecified: Secondary | ICD-10-CM | POA: Diagnosis not present

## 2022-06-17 LAB — CUP PACEART REMOTE DEVICE CHECK
Date Time Interrogation Session: 20240221232144
Implantable Pulse Generator Implant Date: 20210525

## 2022-06-30 NOTE — Progress Notes (Signed)
Carelink Summary Report / Loop Recorder 

## 2022-07-21 ENCOUNTER — Ambulatory Visit (INDEPENDENT_AMBULATORY_CARE_PROVIDER_SITE_OTHER): Payer: Medicare HMO

## 2022-07-21 DIAGNOSIS — I639 Cerebral infarction, unspecified: Secondary | ICD-10-CM | POA: Diagnosis not present

## 2022-07-22 LAB — CUP PACEART REMOTE DEVICE CHECK
Date Time Interrogation Session: 20240331230347
Implantable Pulse Generator Implant Date: 20210525

## 2022-07-28 NOTE — Progress Notes (Signed)
Carelink Summary Report / Loop Recorder 

## 2022-08-25 ENCOUNTER — Ambulatory Visit (INDEPENDENT_AMBULATORY_CARE_PROVIDER_SITE_OTHER): Payer: Medicare HMO

## 2022-08-25 DIAGNOSIS — R55 Syncope and collapse: Secondary | ICD-10-CM

## 2022-08-25 LAB — CUP PACEART REMOTE DEVICE CHECK
Date Time Interrogation Session: 20240503230505
Implantable Pulse Generator Implant Date: 20210525

## 2022-08-28 NOTE — Progress Notes (Signed)
Carelink Summary Report / Loop Recorder 

## 2022-09-17 ENCOUNTER — Encounter: Payer: Self-pay | Admitting: Gastroenterology

## 2022-09-23 NOTE — Progress Notes (Signed)
Carelink Summary Report / Loop Recorder 

## 2022-09-29 ENCOUNTER — Ambulatory Visit (INDEPENDENT_AMBULATORY_CARE_PROVIDER_SITE_OTHER): Payer: Medicare HMO

## 2022-09-29 DIAGNOSIS — R55 Syncope and collapse: Secondary | ICD-10-CM | POA: Diagnosis not present

## 2022-09-29 LAB — CUP PACEART REMOTE DEVICE CHECK
Date Time Interrogation Session: 20240609230133
Implantable Pulse Generator Implant Date: 20210525

## 2022-10-21 NOTE — Progress Notes (Signed)
Carelink Summary Report / Loop Recorder 

## 2022-11-03 ENCOUNTER — Ambulatory Visit (INDEPENDENT_AMBULATORY_CARE_PROVIDER_SITE_OTHER): Payer: Medicare HMO

## 2022-11-03 DIAGNOSIS — I639 Cerebral infarction, unspecified: Secondary | ICD-10-CM

## 2022-11-04 LAB — CUP PACEART REMOTE DEVICE CHECK
Date Time Interrogation Session: 20240712230407
Implantable Pulse Generator Implant Date: 20210525

## 2022-11-17 NOTE — Progress Notes (Signed)
Carelink Summary Report / Loop Recorder 

## 2022-12-05 LAB — CUP PACEART REMOTE DEVICE CHECK
Date Time Interrogation Session: 20240816091741
Implantable Pulse Generator Implant Date: 20210525

## 2022-12-08 ENCOUNTER — Ambulatory Visit (INDEPENDENT_AMBULATORY_CARE_PROVIDER_SITE_OTHER): Payer: Medicare HMO

## 2022-12-08 DIAGNOSIS — I639 Cerebral infarction, unspecified: Secondary | ICD-10-CM

## 2022-12-18 NOTE — Progress Notes (Signed)
Carelink Summary Report / Loop Recorder 

## 2023-01-07 LAB — CUP PACEART REMOTE DEVICE CHECK
Date Time Interrogation Session: 20240918091823
Implantable Pulse Generator Implant Date: 20210525

## 2023-01-12 ENCOUNTER — Ambulatory Visit (INDEPENDENT_AMBULATORY_CARE_PROVIDER_SITE_OTHER): Payer: Medicare HMO

## 2023-01-12 DIAGNOSIS — I639 Cerebral infarction, unspecified: Secondary | ICD-10-CM

## 2023-01-26 NOTE — Progress Notes (Signed)
Carelink Summary Report / Loop Recorder 

## 2023-02-16 ENCOUNTER — Ambulatory Visit (INDEPENDENT_AMBULATORY_CARE_PROVIDER_SITE_OTHER): Payer: Medicare HMO

## 2023-02-16 DIAGNOSIS — I639 Cerebral infarction, unspecified: Secondary | ICD-10-CM | POA: Diagnosis not present

## 2023-02-17 LAB — CUP PACEART REMOTE DEVICE CHECK
Date Time Interrogation Session: 20241027231905
Implantable Pulse Generator Implant Date: 20210525

## 2023-03-06 NOTE — Progress Notes (Signed)
Carelink Summary Report / Loop Recorder 

## 2023-03-23 ENCOUNTER — Ambulatory Visit (INDEPENDENT_AMBULATORY_CARE_PROVIDER_SITE_OTHER): Payer: Medicare HMO

## 2023-03-23 DIAGNOSIS — I639 Cerebral infarction, unspecified: Secondary | ICD-10-CM | POA: Diagnosis not present

## 2023-03-26 LAB — CUP PACEART REMOTE DEVICE CHECK
Date Time Interrogation Session: 20241130232104
Implantable Pulse Generator Implant Date: 20210525

## 2023-04-07 ENCOUNTER — Telehealth: Payer: Self-pay

## 2023-04-07 NOTE — Telephone Encounter (Signed)
Spoke with pt she is aware her ILR is at RRT and does not wish to have it removed at this time. Pt is aware to unplug monitor and throw it away

## 2023-04-07 NOTE — Telephone Encounter (Signed)
  ILR reached RRT: 04/06/23  Marked "I" in Paceart: Yes Enter note in Paceart:Yes Canceled future remotes:Yes Discontinued from website:Yes Entered in Speciality Comments:Yes  Advised if further questions arise to please call the device clinic at [phone].   Attempted to contact patient to advise about ILR @ RRT. No answer, LMTCB.
